# Patient Record
Sex: Female | Born: 1965
Health system: Southern US, Community
[De-identification: ages and names within clinical notes are randomized; demographics above are authoritative.]

## PROBLEM LIST (undated history)

## (undated) DIAGNOSIS — I10 Essential (primary) hypertension: Secondary | ICD-10-CM

## (undated) DIAGNOSIS — D869 Sarcoidosis, unspecified: Secondary | ICD-10-CM

## (undated) DIAGNOSIS — R569 Unspecified convulsions: Secondary | ICD-10-CM

## (undated) DIAGNOSIS — F32A Depression, unspecified: Secondary | ICD-10-CM

---

## 2007-02-06 ENCOUNTER — Ambulatory Visit: Payer: Self-pay | Admitting: Internal Medicine

## 2007-02-06 LAB — CONVERTED CEMR LAB
AST: 19 units/L (ref 0–37)
Alkaline Phosphatase: 47 units/L (ref 39–117)
BUN: 11 mg/dL (ref 6–23)
Basophils Relative: 1 % (ref 0–1)
Calcium: 9 mg/dL (ref 8.4–10.5)
Creatinine, Ser: 0.75 mg/dL (ref 0.40–1.20)
Eosinophils Absolute: 0.2 10*3/uL (ref 0.0–0.7)
Glucose, Bld: 97 mg/dL (ref 70–99)
HCT: 41.6 % (ref 36.0–46.0)
HDL: 61 mg/dL (ref >39)
Hemoglobin: 13.4 g/dL (ref 12.0–15.0)
LDL Cholesterol: 81 mg/dL (ref 0–99)
MCHC: 32.2 g/dL (ref 30.0–36.0)
MCV: 87.9 fL (ref 78.0–100.0)
Monocytes Absolute: 0.4 10*3/uL (ref 0.2–0.7)
Monocytes Relative: 14 % — ABNORMAL HIGH (ref 3–11)
RBC: 4.73 M/uL (ref 3.87–5.11)
Total CHOL/HDL Ratio: 2.7
Triglycerides: 100 mg/dL (ref <150)

## 2007-02-09 ENCOUNTER — Ambulatory Visit: Payer: Self-pay | Admitting: *Deleted

## 2007-03-02 ENCOUNTER — Encounter: Payer: Self-pay | Admitting: Family Medicine

## 2007-03-02 ENCOUNTER — Ambulatory Visit: Payer: Self-pay | Admitting: Internal Medicine

## 2007-03-20 ENCOUNTER — Ambulatory Visit (HOSPITAL_COMMUNITY): Admission: RE | Admit: 2007-03-20 | Discharge: 2007-03-20 | Payer: Self-pay | Admitting: Family Medicine

## 2009-02-10 ENCOUNTER — Encounter: Admission: RE | Admit: 2009-02-10 | Discharge: 2009-02-10 | Payer: Self-pay | Admitting: Internal Medicine

## 2009-02-10 IMAGING — US US SOFT TISSUE HEAD/NECK
1 series · 14 of 18 positions shown · non-contrast
Comparison: None

CLINICAL DATA: Enlarged thyroid on physical exam

THYROID ULTRASOUND
TECHNIQUE: Ultrasound examination of the thyroid gland and
adjacent soft tissues was performed.

[Series 1: us soft tissue head/neck · 0.07mm/px · 14 of 18 slices shown]
[im 1/18]
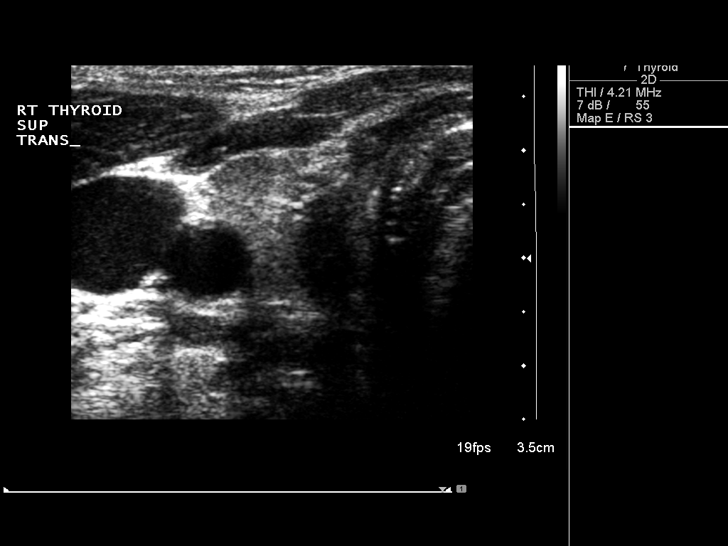
[im 2/18]
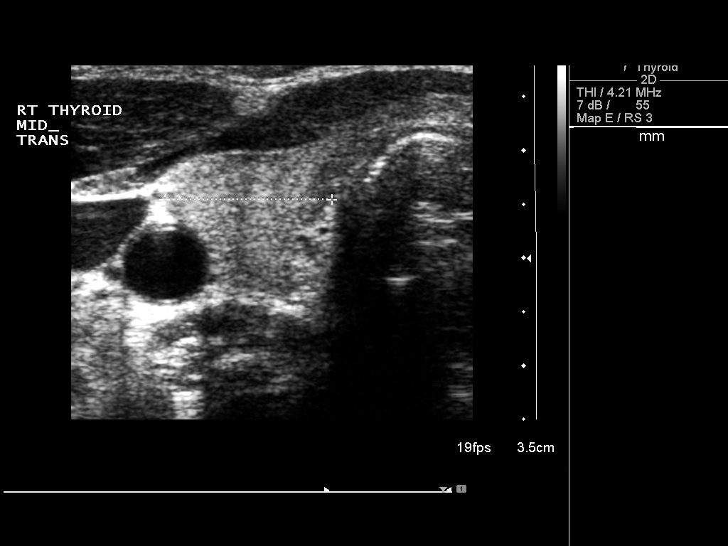
[im 4/18]
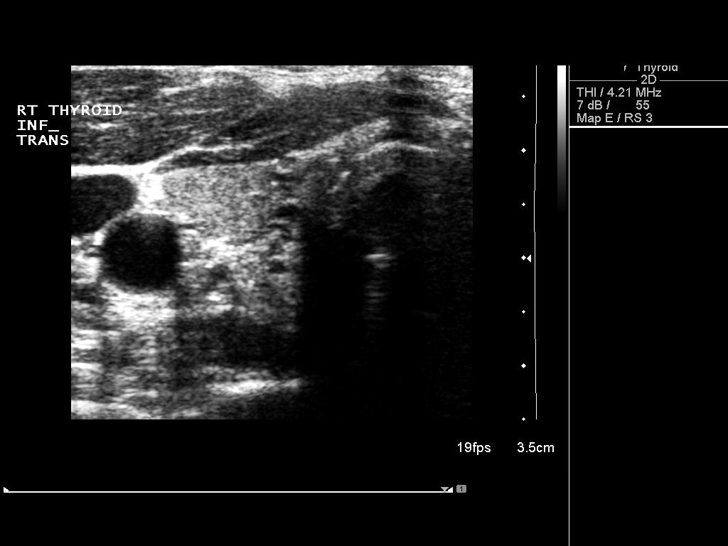
[im 5/18]
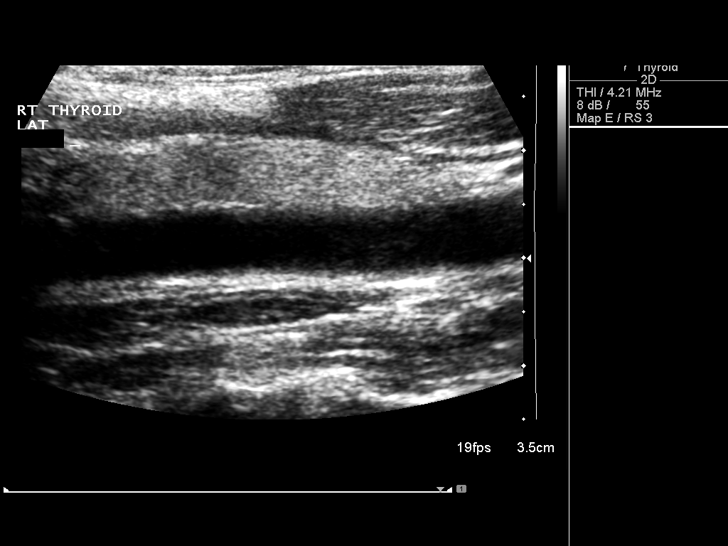
[im 6/18]
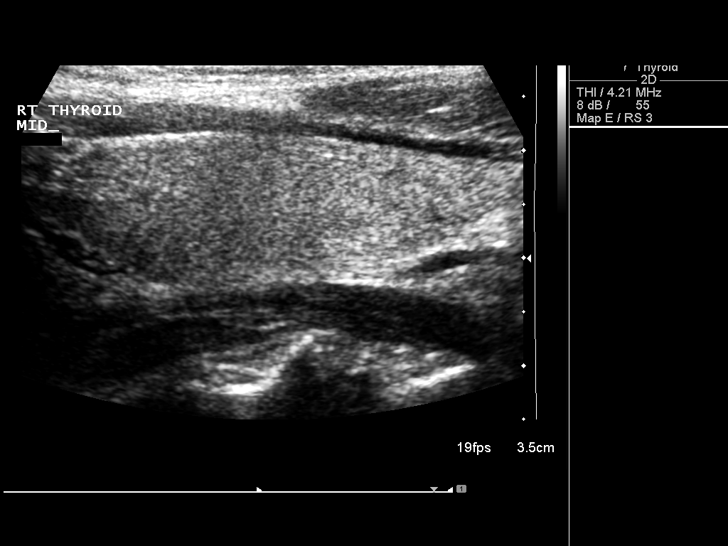
[im 8/18]
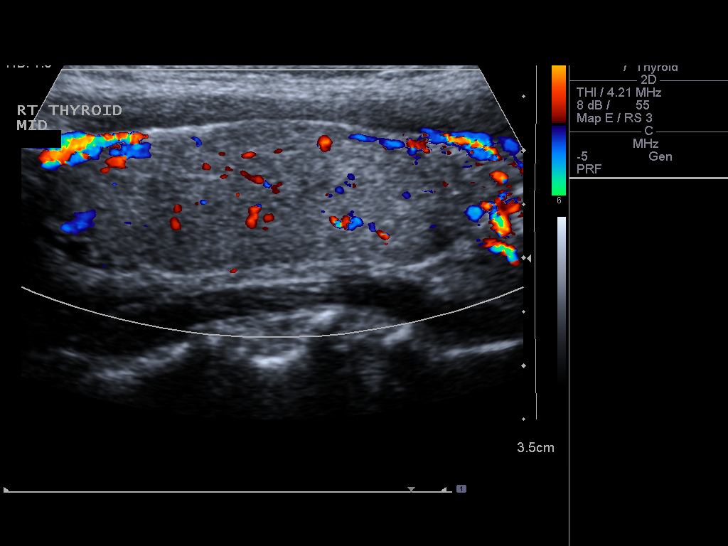
[im 9/18]
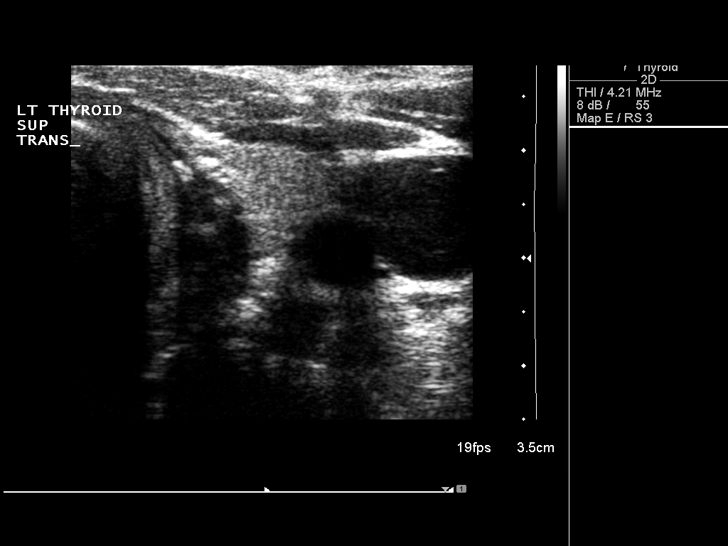
[im 10/18]
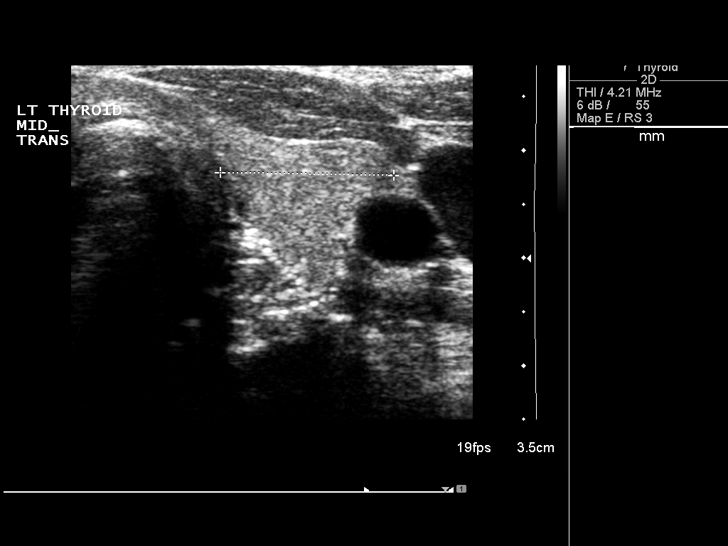
[im 11/18]
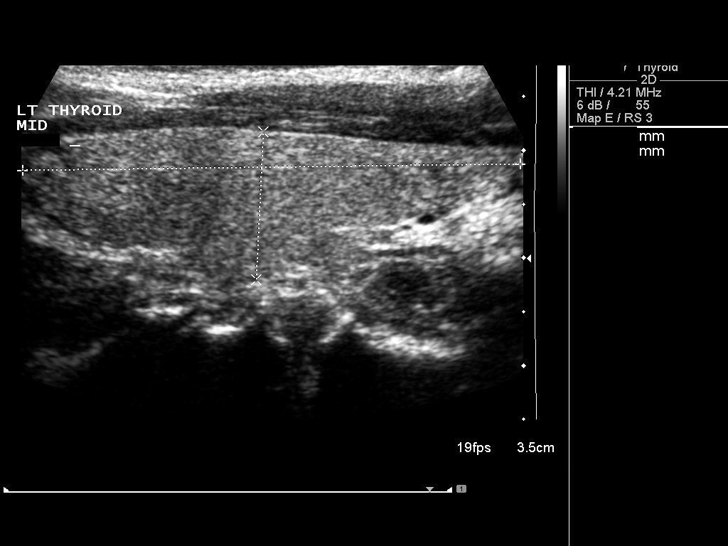
[im 13/18]
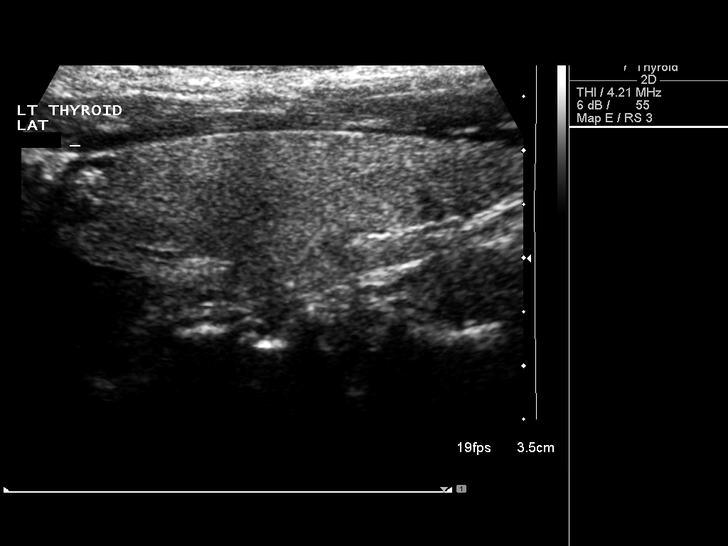
[im 14/18]
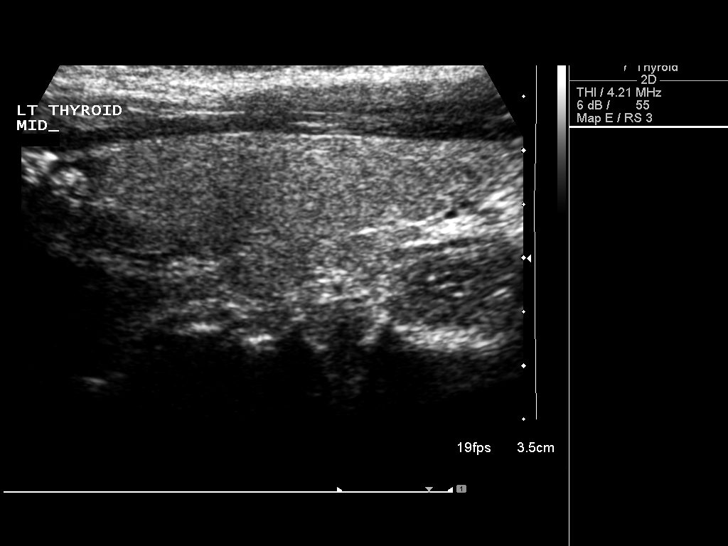
[im 15/18]
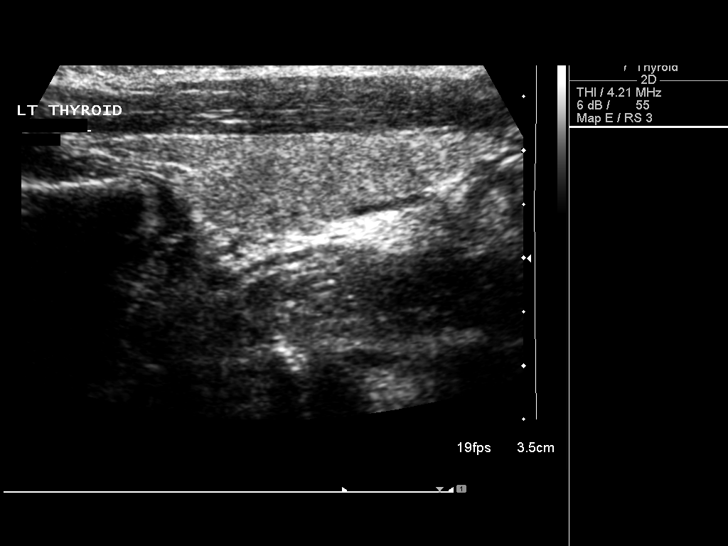
[im 17/18]
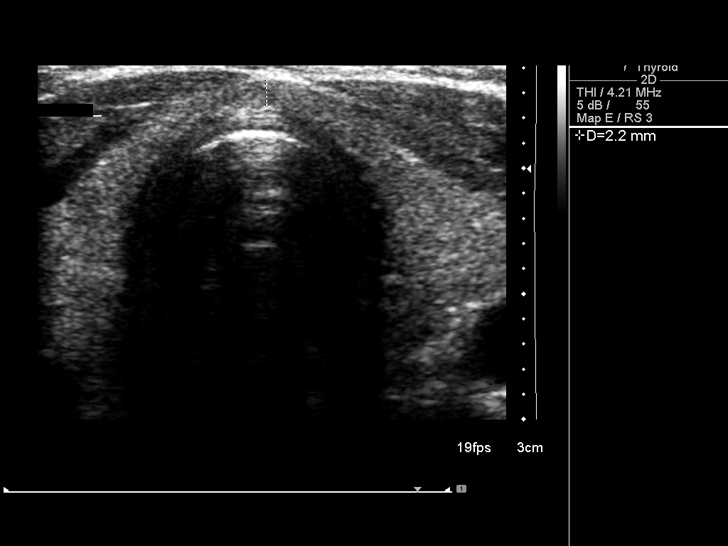
[im 18/18]
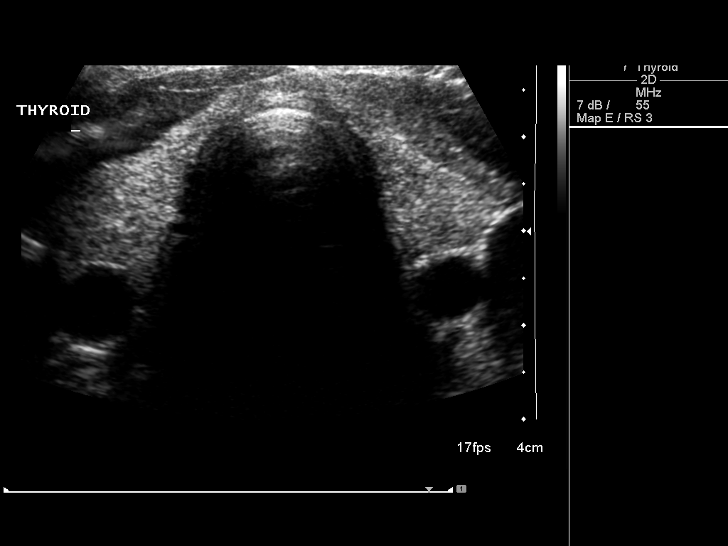

[14 of 18 positions shown; findings below may reference images not displayed]

FINDINGS: The thyroid gland is within normal limits in size.  The
right lobe measures 4.4 cm sagittally, with a depth of 1.5 cm and
width of 1.6 cm.  The left lobe measures 4.6 x 1.4 x 1.6 cm, with
the isthmus measuring 2 mm.  The gland is homogeneous in
echogenicity.  No solid or cystic nodule is seen.
IMPRESSION: The thyroid gland is normal in size with no solid or cystic nodule
noted.

## 2009-06-26 ENCOUNTER — Other Ambulatory Visit: Admission: RE | Admit: 2009-06-26 | Discharge: 2009-06-26 | Payer: Self-pay | Admitting: *Deleted

## 2009-07-05 ENCOUNTER — Ambulatory Visit (HOSPITAL_COMMUNITY): Admission: RE | Admit: 2009-07-05 | Discharge: 2009-07-05 | Payer: Self-pay | Admitting: Internal Medicine

## 2011-03-04 ENCOUNTER — Other Ambulatory Visit: Payer: Self-pay | Admitting: Family Medicine

## 2011-03-04 DIAGNOSIS — I10 Essential (primary) hypertension: Secondary | ICD-10-CM

## 2011-03-11 ENCOUNTER — Ambulatory Visit
Admission: RE | Admit: 2011-03-11 | Discharge: 2011-03-11 | Disposition: A | Discharge status: Home / Self Care | Payer: PRIVATE HEALTH INSURANCE | Source: Ambulatory Visit | Attending: Family Medicine | Admitting: Family Medicine

## 2011-03-11 DIAGNOSIS — I10 Essential (primary) hypertension: Secondary | ICD-10-CM

## 2011-03-11 IMAGING — US US ABDOMEN COMPLETE
1 series · 14 of 25 positions shown · non-contrast
Comparison: None.

CLINICAL DATA: Hypertension.  Question splenomegaly.

COMPLETE ABDOMINAL ULTRASOUND

[Series 1: us abdomen complete · 0.30mm/px · 14 of 93 slices shown]
[im 1/93]
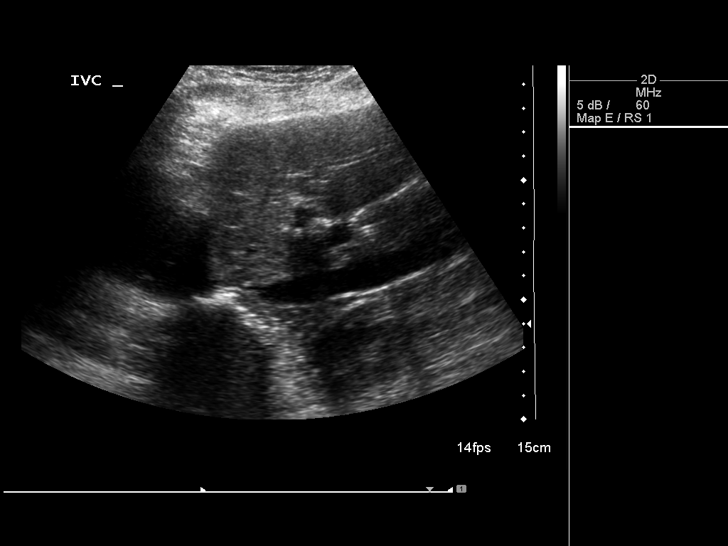
[im 8/93]
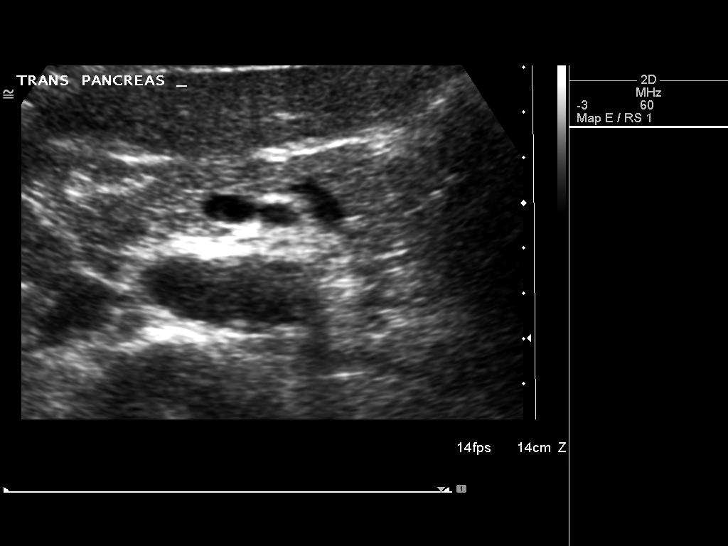
[im 16/93]
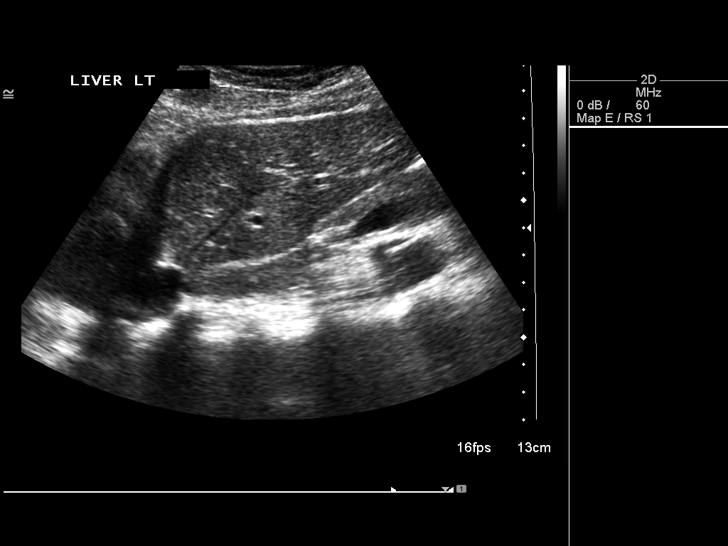
[im 24/93]
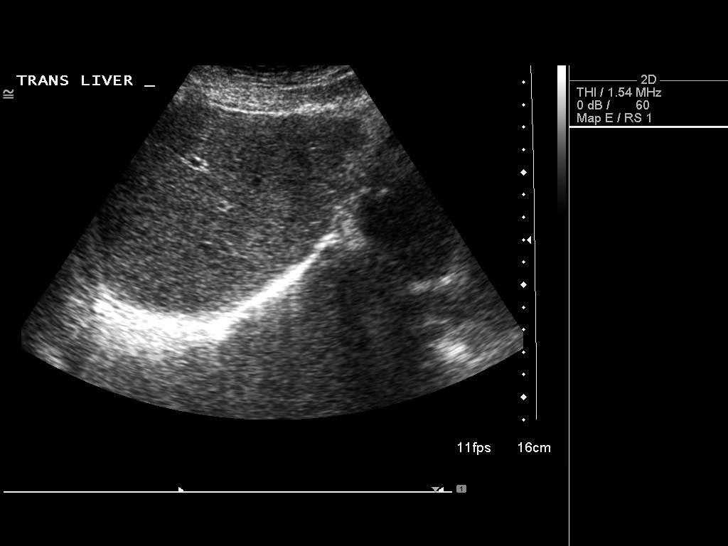
[im 31/93]
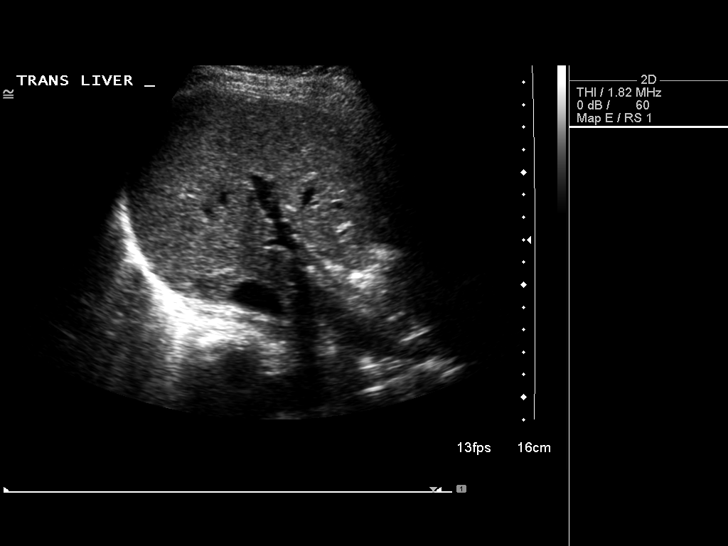
[im 35/93]
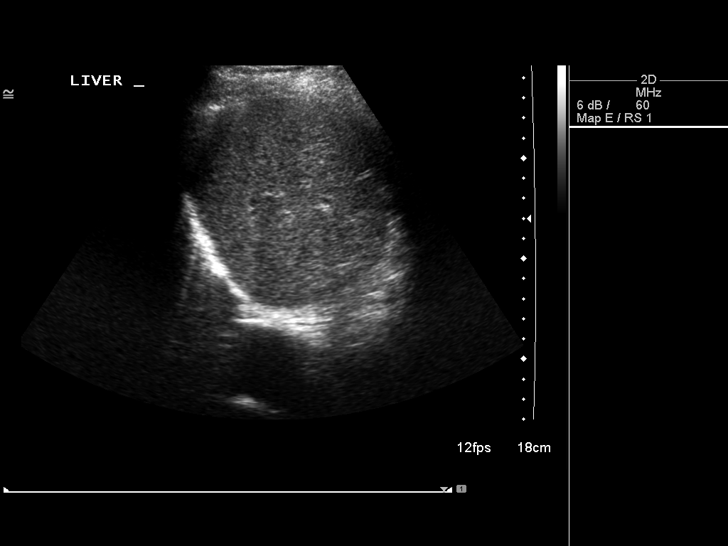
[im 43/93]
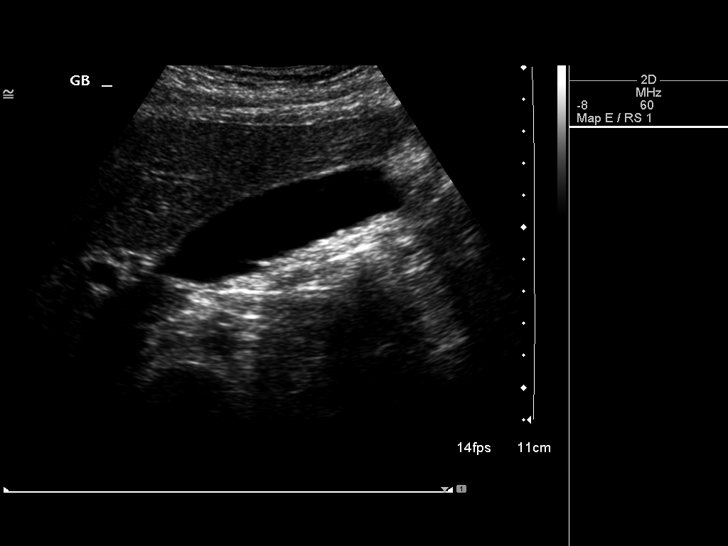
[im 50/93]
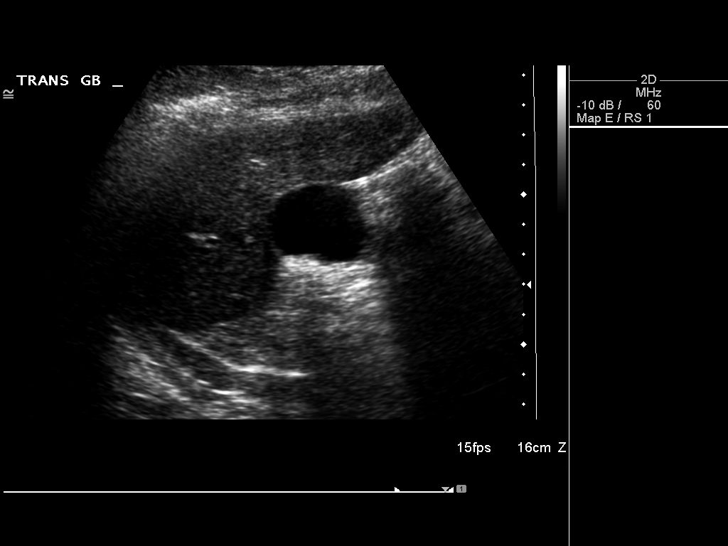
[im 58/93]
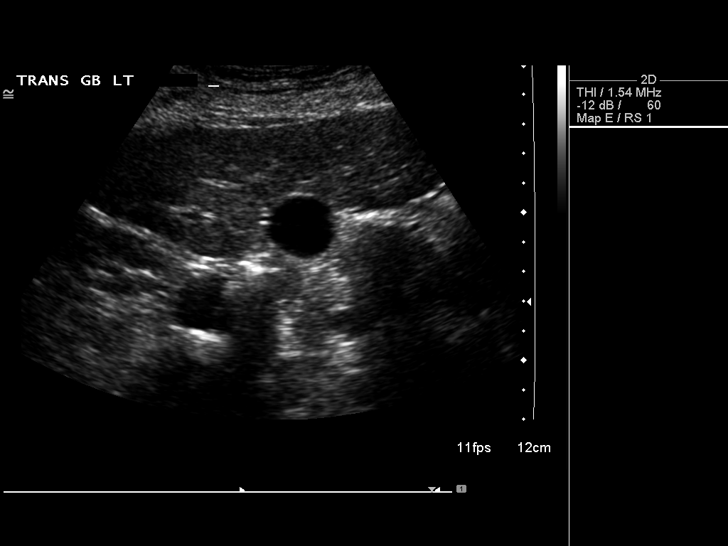
[im 62/93]
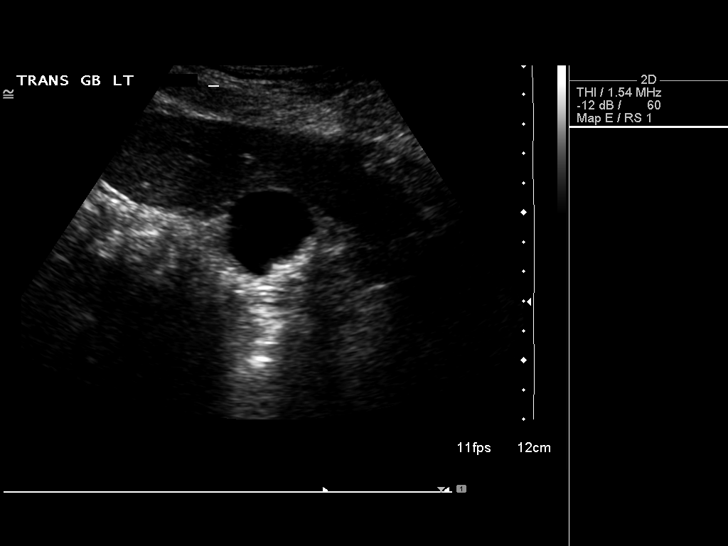
[im 70/93]
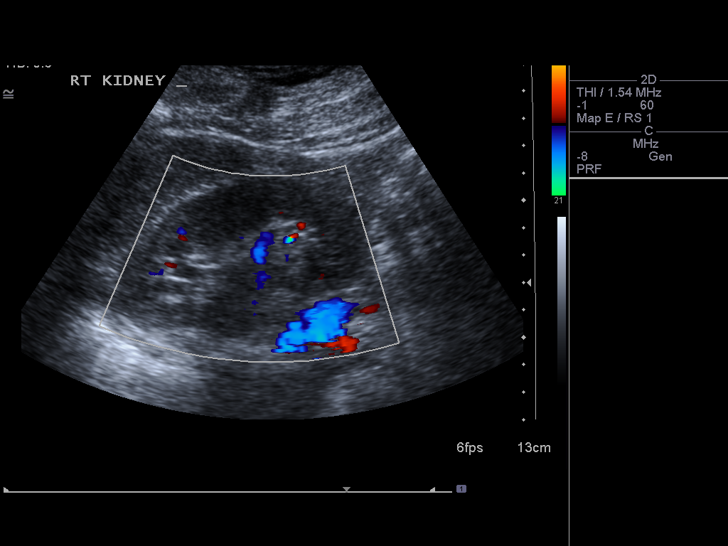
[im 77/93]
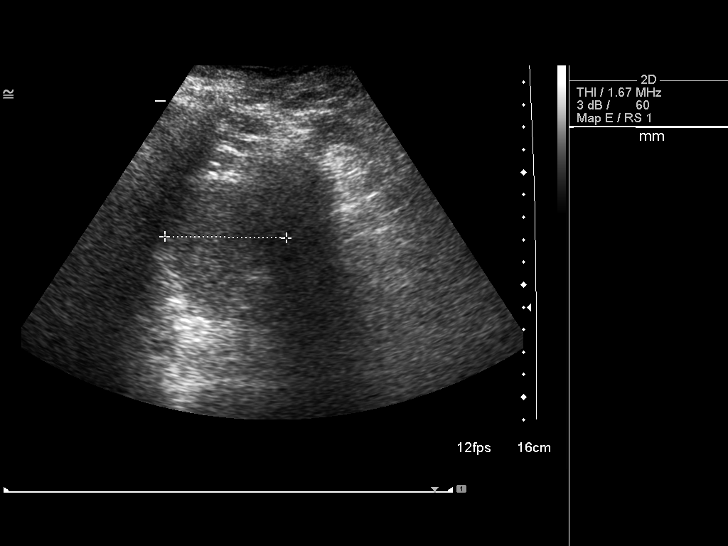
[im 85/93]
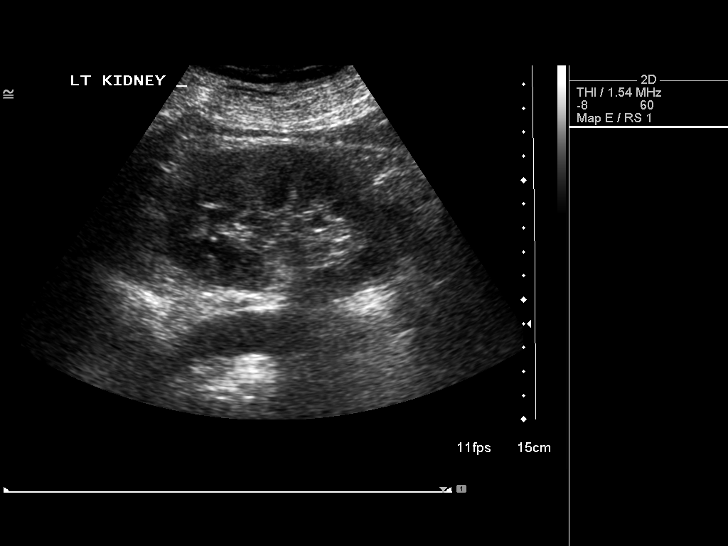
[im 93/93]
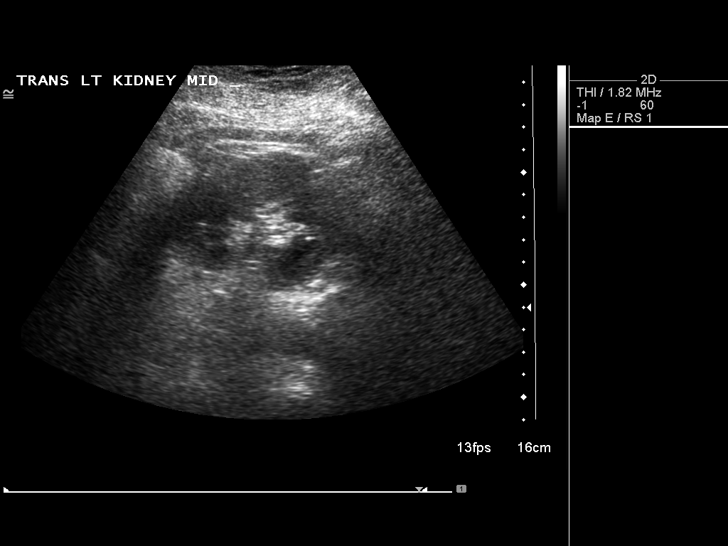

[14 of 25 positions shown; findings below may reference images not displayed]

FINDINGS: Gallbladder:  Numerous echogenic foci within the gallbladder.
Minimal if any shadowing.  No wall thickening or pericholecystic
fluid. Sonographic Murphy's sign was not elicited.

Common bile duct: Normal, 2 mm.

Liver: Normal in echogenicity, without focal lesion.

IVC: Negative

Pancreas:  Negative

Spleen:  Within normal limits.  Splenic length 5.4 cm.

Right Kidney:  10.2 cm. No hydronephrosis.

Left Kidney:  10.0 cm. No hydronephrosis.

Abdominal aorta:  Nonaneurysmal without ascites.
IMPRESSION: 1.  No evidence of splenomegaly or acute abdominal process.
2.  Nonshadowing gallstones versus sludge.  No evidence of acute
cholecystitis.

## 2013-08-02 ENCOUNTER — Emergency Department (HOSPITAL_COMMUNITY): Payer: PRIVATE HEALTH INSURANCE

## 2013-08-02 ENCOUNTER — Encounter (HOSPITAL_COMMUNITY): Payer: Self-pay | Admitting: Emergency Medicine

## 2013-08-02 DIAGNOSIS — Z7982 Long term (current) use of aspirin: Secondary | ICD-10-CM | POA: Insufficient documentation

## 2013-08-02 DIAGNOSIS — I10 Essential (primary) hypertension: Secondary | ICD-10-CM | POA: Insufficient documentation

## 2013-08-02 DIAGNOSIS — J040 Acute laryngitis: Secondary | ICD-10-CM | POA: Insufficient documentation

## 2013-08-02 DIAGNOSIS — J069 Acute upper respiratory infection, unspecified: Secondary | ICD-10-CM | POA: Insufficient documentation

## 2013-08-02 DIAGNOSIS — Z87891 Personal history of nicotine dependence: Secondary | ICD-10-CM | POA: Insufficient documentation

## 2013-08-02 DIAGNOSIS — Z8619 Personal history of other infectious and parasitic diseases: Secondary | ICD-10-CM | POA: Insufficient documentation

## 2013-08-02 LAB — CBC
HEMATOCRIT: 39.2 % (ref 36.0–46.0)
HEMOGLOBIN: 13.4 g/dL (ref 12.0–15.0)
MCH: 29.1 pg (ref 26.0–34.0)
MCHC: 34.2 g/dL (ref 30.0–36.0)
MCV: 85 fL (ref 78.0–100.0)
Platelets: 126 10*3/uL — ABNORMAL LOW (ref 150–400)
RBC: 4.61 MIL/uL (ref 3.87–5.11)
RDW: 13.8 % (ref 11.5–15.5)
WBC: 2 10*3/uL — AB (ref 4.0–10.5)

## 2013-08-02 LAB — BASIC METABOLIC PANEL
BUN: 7 mg/dL (ref 6–23)
CHLORIDE: 96 meq/L (ref 96–112)
CO2: 27 meq/L (ref 19–32)
Calcium: 9 mg/dL (ref 8.4–10.5)
Creatinine, Ser: 0.63 mg/dL (ref 0.50–1.10)
GFR calc non Af Amer: >90 mL/min (ref >90)
Glucose, Bld: 99 mg/dL (ref 70–99)
POTASSIUM: 4.7 meq/L (ref 3.7–5.3)
Sodium: 137 mEq/L (ref 137–147)

## 2013-08-02 IMAGING — CR DG CHEST 2V
2 series · 2 of 2 positions shown · non-contrast
Comparison: None.

CLINICAL DATA: Sore throat.

EXAM:
CHEST  2 VIEW

[w chest pa]
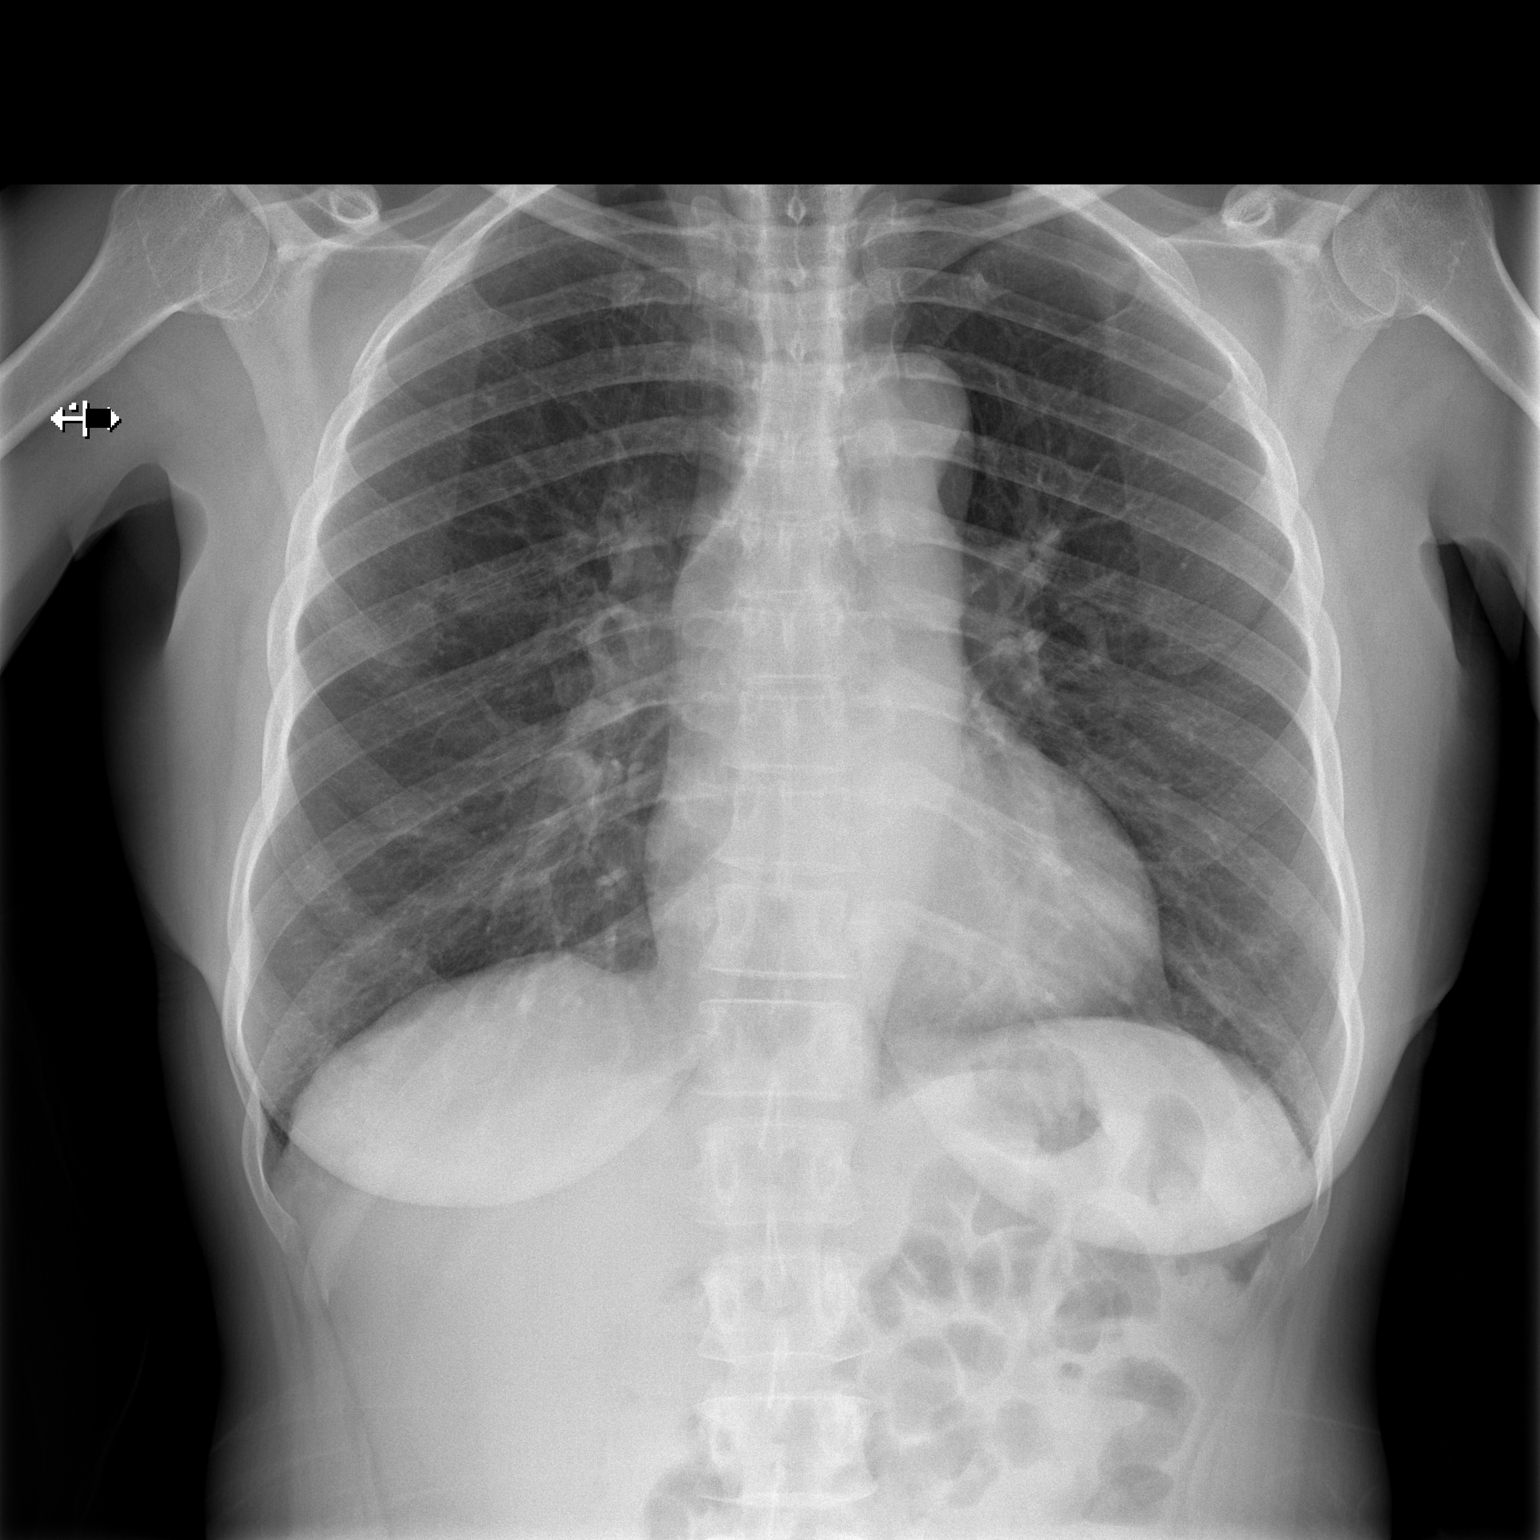

[w chest lat]
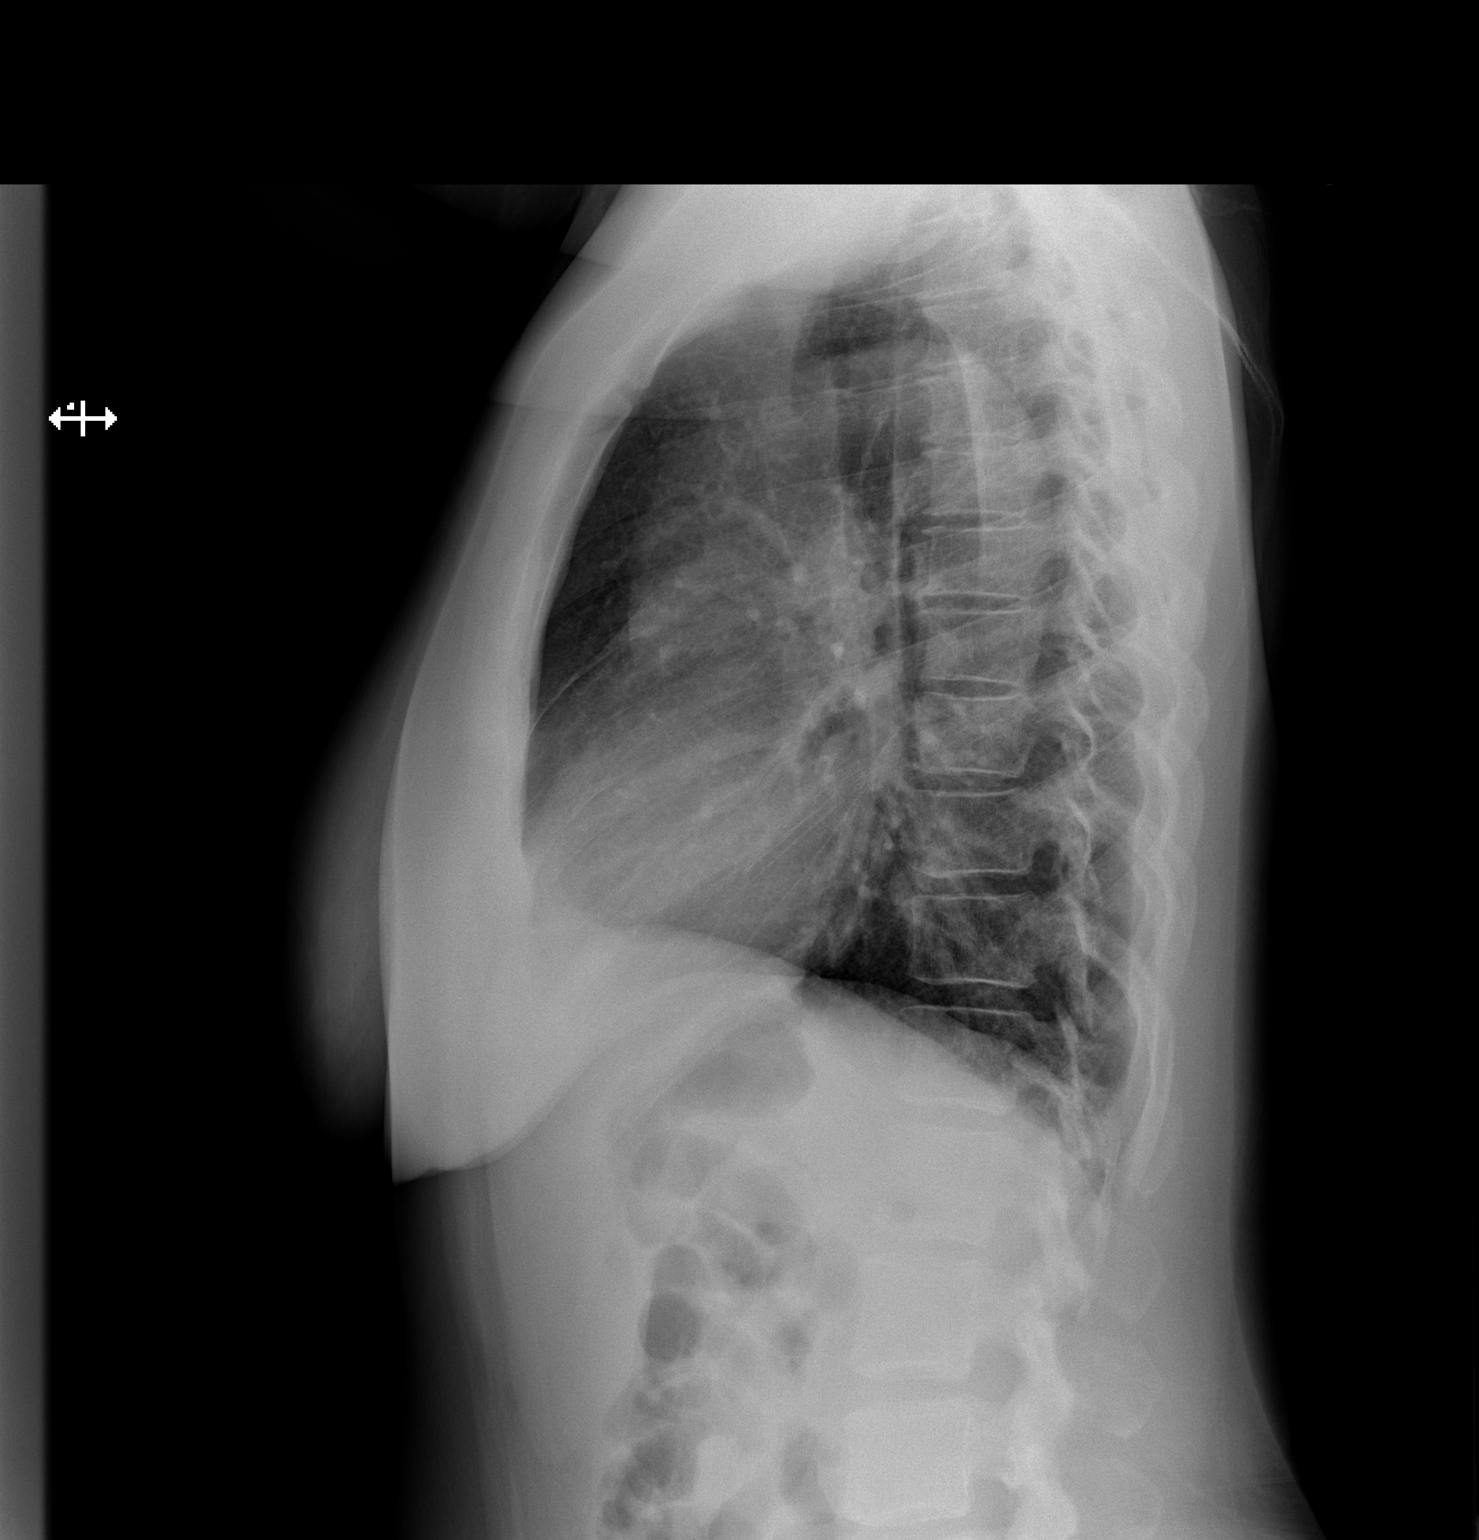

[2 of 2 positions shown; findings below may reference images not displayed]

FINDINGS: Cardiopericardial silhouette within normal limits. Mediastinal
contours normal. Trachea midline. No airspace disease or effusion.
IMPRESSION: No active cardiopulmonary disease.

## 2013-08-02 MED ORDER — ALBUTEROL SULFATE (2.5 MG/3ML) 0.083% IN NEBU
5.0000 mg | INHALATION_SOLUTION | Freq: Once | RESPIRATORY_TRACT | Status: AC
Start: 2013-08-02 — End: 2013-08-02
  Administered 2013-08-02: 5 mg via RESPIRATORY_TRACT
  Filled 2013-08-02: qty 6

## 2013-08-02 NOTE — ED Notes (Signed)
Pt reports that on Thursday she woke up with itchy/sore throat and loss of voice. Pt also reports difficulty breathing specifically when she lays down. Pt with inspiratory and expiratory wheezes. Pt also has productive cough.

## 2013-08-03 ENCOUNTER — Emergency Department (HOSPITAL_COMMUNITY)
Admission: EM | Admit: 2013-08-03 | Discharge: 2013-08-03 | Disposition: A | Discharge status: Home / Self Care | Payer: Self-pay | Attending: Emergency Medicine | Admitting: Emergency Medicine

## 2013-08-03 DIAGNOSIS — J040 Acute laryngitis: Secondary | ICD-10-CM

## 2013-08-03 DIAGNOSIS — J069 Acute upper respiratory infection, unspecified: Secondary | ICD-10-CM

## 2013-08-03 HISTORY — DX: Sarcoidosis, unspecified: D86.9

## 2013-08-03 HISTORY — DX: Essential (primary) hypertension: I10

## 2013-08-03 MED ORDER — DEXAMETHASONE SODIUM PHOSPHATE 10 MG/ML IJ SOLN
10.0000 mg | Freq: Once | INTRAMUSCULAR | Status: AC
  Administered 2013-08-03: 10 mg via INTRAMUSCULAR
  Filled 2013-08-03: qty 1

## 2013-08-03 MED ORDER — PREDNISONE 20 MG PO TABS: ORAL_TABLET | ORAL | Status: DC

## 2013-08-03 MED ORDER — ALBUTEROL SULFATE HFA 108 (90 BASE) MCG/ACT IN AERS
2.0000 | INHALATION_SPRAY | Freq: Once | RESPIRATORY_TRACT | Status: AC
  Administered 2013-08-03: 2 via RESPIRATORY_TRACT
  Filled 2013-08-03: qty 6.7

## 2013-08-03 NOTE — Discharge Instructions (Signed)
Cool Mist Vaporizers °Vaporizers may help relieve the symptoms of a cough and cold. They add moisture to the air, which helps mucus to become thinner and less sticky. This makes it easier to breathe and cough up secretions. Cool mist vaporizers do not cause serious burns like hot mist vaporizers ("steamers, humidifiers"). Vaporizers have not been proved to show they help with colds. You should not use a vaporizer if you are allergic to mold.  °HOME CARE INSTRUCTIONS °· Follow the package instructions for the vaporizer. °· Do not use anything other than distilled water in the vaporizer. °· Do not run the vaporizer all of the time. This can cause mold or bacteria to grow in the vaporizer. °· Clean the vaporizer after each time it is used. °· Clean and dry the vaporizer well before storing it. °· Stop using the vaporizer if worsening respiratory symptoms develop. °Document Released: 01/11/2004 Document Revised: 12/16/2012 Document Reviewed: 09/02/2012 °Exit[employer]® Patient Information ©2014 [employer]. ° °

## 2013-08-03 NOTE — ED Notes (Signed)
Pt called for room no answer x1 

## 2013-08-03 NOTE — ED Provider Notes (Signed)
CSN: [id number]     Arrival date & time 08/02/13  1807 History   First MD Initiated Contact with Patient 08/03/13 0125     Chief Complaint  Patient presents with  . Sore Throat     (Consider location/radiation/quality/duration/timing/severity/associated sxs/prior Treatment) HPI This patient is a 48 yo woman who has had about 4 days of a hoarse voice, productive cough and sensation of difficulty breathing when laying supine. She has a history of sarcoidosis and HTN. She has a sore throat as well. Discomfort is mild to moderate, worse with talking. No fever.   The patient has not had similar sx in the past. She is a non-smoker. No unusual exposures. She was treated with Albuterol 5mg  SVN while waiting to be seen and says that this helped.    Past Medical History  Diagnosis Date  . Hypertension   . Sarcoidosis    History reviewed. No pertinent past surgical history. No family history on file. History  Substance Use Topics  . Smoking status: Former Smoker    Quit date: 07/26/2013  . Smokeless tobacco: Not on file  . Alcohol Use: Yes     Comment: occasionally   OB History   Grav Para Term Preterm Abortions TAB SAB Ect Mult Living                 Review of Systems Ten point review of symptoms performed and is negative with the exception of symptoms noted above.     Allergies  Review of patient's allergies indicates no known allergies.  Home Medications   Current Outpatient Rx  Name  Route  Sig  Dispense  Refill  . aspirin 325 MG tablet   Oral   Take 325 mg by mouth daily.         Marland Kitchen. dextromethorphan-guaiFENesin (MUCINEX DM) 30-600 MG per 12 hr tablet   Oral   Take 1 tablet by mouth 2 (two) times daily as needed for cough.         . Multiple Vitamins-Minerals (MULTIVITAMIN WITH MINERALS) tablet   Oral   Take 1 tablet by mouth daily.          BP 153/99  Pulse 82  Temp(Src) 98.2 F (36.8 C) (Oral)  Resp 18  SpO2 98% Physical Exam Gen: well developed and  well nourished appearing, hoarse sounding voice Head: NCAT Eyes: PERL, EOMI Nose: no epistaixis or rhinorrhea Mouth/throat: mucosa is moist and pink Neck: supple, no stridor Lungs: CTA B, no wheezing, rhonchi or rales CV: regular rate and rythm, good distal pulses.  Abd: soft, notender, nondistended Back: no ttp, no cva ttp Skin: warm and dry Ext: no edema, normal to inspection Neuro: CN ii-xii grossly intact, no focal deficits Psyche; normal affect,  calm and cooperative. ED Course  Procedures (including critical care time) Labs Review  Results for orders placed during the hospital encounter of 08/03/13 (from the past 24 hour(s))  BASIC METABOLIC PANEL     Status: None   Collection Time    08/02/13  9:16 PM      Result Value Ref Range   Sodium 137  137 - 147 mEq/L   Potassium 4.7  3.7 - 5.3 mEq/L   Chloride 96  96 - 112 mEq/L   CO2 27  19 - 32 mEq/L   Glucose, Bld 99  70 - 99 mg/dL   BUN 7  6 - 23 mg/dL   Creatinine, Ser 4.090.63  0.50 - 1.10 mg/dL   Calcium 9.0  8.4 - 10.5 mg/dL   GFR calc non Af Amer >90  >90 mL/min   GFR calc Af Amer >90  >90 mL/min  CBC     Status: Abnormal   Collection Time    08/02/13  9:16 PM      Result Value Ref Range   WBC 2.0 (*) 4.0 - 10.5 K/uL   RBC 4.61  3.87 - 5.11 MIL/uL   Hemoglobin 13.4  12.0 - 15.0 g/dL   HCT 57.8  46.9 - 62.9 %   MCV 85.0  78.0 - 100.0 fL   MCH 29.1  26.0 - 34.0 pg   MCHC 34.2  30.0 - 36.0 g/dL   RDW 52.8  41.3 - 24.4 %   Platelets 126 (*) 150 - 400 K/uL     Imaging Review Dg Chest 2 View (if Patient Has Fever And/or Copd)  08/02/2013   CLINICAL DATA:  Sore throat.  EXAM: CHEST  2 VIEW  COMPARISON:  None.  FINDINGS: Cardiopericardial silhouette within normal limits. Mediastinal contours normal. Trachea midline. No airspace disease or effusion.  IMPRESSION: No active cardiopulmonary disease.   Electronically Signed   By: Andreas Newport M.D.   On: 08/02/2013 19:37      MDM  Patient's symptoms are most consistent  with viral URI with bronchitis and laryngitis. The patient is managing her secretions, eating fine and is able to speak. We will tx with steroid burst and Albuterol as needed via HFA. Patient is counseled to return to the ED for worsening symptoms and to schedule f/u with the [facility] in 2 days. Referred to ENT should her sx not resolve within 7 to 10 days.     Brandt Loosen, MD 08/03/13 [phone]

## 2013-08-03 NOTE — ED Notes (Signed)
Pt called for room with no answer x 2. 

## 2013-08-03 NOTE — ED Notes (Signed)
Pt called for room with no answer x 3 

## 2013-12-17 ENCOUNTER — Ambulatory Visit: Payer: Commercial Indemnity | Attending: Internal Medicine | Admitting: Internal Medicine

## 2013-12-17 VITALS — BP 191/116 | HR 55 | Temp 99.0°F | Resp 16 | Ht 62.0 in | Wt 148.4 lb

## 2013-12-17 DIAGNOSIS — R630 Anorexia: Secondary | ICD-10-CM | POA: Insufficient documentation

## 2013-12-17 DIAGNOSIS — R05 Cough: Secondary | ICD-10-CM | POA: Insufficient documentation

## 2013-12-17 DIAGNOSIS — Z7189 Other specified counseling: Secondary | ICD-10-CM

## 2013-12-17 DIAGNOSIS — H5789 Other specified disorders of eye and adnexa: Secondary | ICD-10-CM | POA: Insufficient documentation

## 2013-12-17 DIAGNOSIS — I10 Essential (primary) hypertension: Secondary | ICD-10-CM | POA: Diagnosis not present

## 2013-12-17 DIAGNOSIS — F17200 Nicotine dependence, unspecified, uncomplicated: Secondary | ICD-10-CM | POA: Insufficient documentation

## 2013-12-17 DIAGNOSIS — R059 Cough, unspecified: Secondary | ICD-10-CM | POA: Insufficient documentation

## 2013-12-17 DIAGNOSIS — R498 Other voice and resonance disorders: Secondary | ICD-10-CM | POA: Insufficient documentation

## 2013-12-17 DIAGNOSIS — Z7689 Persons encountering health services in other specified circumstances: Secondary | ICD-10-CM

## 2013-12-17 DIAGNOSIS — D869 Sarcoidosis, unspecified: Secondary | ICD-10-CM | POA: Diagnosis not present

## 2013-12-17 DIAGNOSIS — H5710 Ocular pain, unspecified eye: Secondary | ICD-10-CM | POA: Insufficient documentation

## 2013-12-17 DIAGNOSIS — Z7982 Long term (current) use of aspirin: Secondary | ICD-10-CM | POA: Insufficient documentation

## 2013-12-17 LAB — POCT URINALYSIS DIPSTICK
Bilirubin, UA: NEGATIVE
Blood, UA: NEGATIVE
GLUCOSE UA: NEGATIVE
KETONES UA: NEGATIVE
Leukocytes, UA: NEGATIVE
Nitrite, UA: NEGATIVE
Protein, UA: NEGATIVE
Urobilinogen, UA: 0.2
pH, UA: 6

## 2013-12-17 LAB — CBC
HEMATOCRIT: 37 % (ref 36.0–46.0)
Hemoglobin: 12.5 g/dL (ref 12.0–15.0)
MCH: 28.3 pg (ref 26.0–34.0)
MCHC: 33.8 g/dL (ref 30.0–36.0)
MCV: 83.7 fL (ref 78.0–100.0)
Platelets: 216 10*3/uL (ref 150–400)
RBC: 4.42 MIL/uL (ref 3.87–5.11)
RDW: 13.9 % (ref 11.5–15.5)
WBC: 2.1 10*3/uL — AB (ref 4.0–10.5)

## 2013-12-17 MED ORDER — CLONIDINE HCL 0.1 MG PO TABS
0.2000 mg | ORAL_TABLET | Freq: Once | ORAL | Status: AC
  Administered 2013-12-17: 0.2 mg via ORAL

## 2013-12-17 MED ORDER — HYDROCHLOROTHIAZIDE 12.5 MG PO TABS: 12.5000 mg | ORAL_TABLET | Freq: Every day | ORAL | Status: DC

## 2013-12-17 MED ORDER — PREDNISONE 20 MG PO TABS: 20.0000 mg | ORAL_TABLET | Freq: Every day | ORAL | Status: DC

## 2013-12-17 MED ORDER — METHYLPREDNISOLONE SODIUM SUCC 125 MG IJ SOLR
125.0000 mg | Freq: Once | INTRAMUSCULAR | Status: AC
  Administered 2013-12-17: 125 mg via INTRAMUSCULAR

## 2013-12-17 MED ORDER — HYDROCHLOROTHIAZIDE 25 MG PO TABS
25.0000 mg | ORAL_TABLET | Freq: Every day | ORAL | Status: DC
Start: 2013-12-17 — End: 2020-01-12

## 2013-12-17 MED ORDER — AMLODIPINE BESYLATE 10 MG PO TABS: 10.0000 mg | ORAL_TABLET | Freq: Every day | ORAL | Status: DC

## 2013-12-17 NOTE — Progress Notes (Signed)
Patient ID: Martha Schneider, female   DOB: 02/15/1966, 48 y.o.   MRN: [id number]  [id number]  [id number]  DOB - 10/03/1965  CC:  Chief Complaint  Patient presents with  . Establish Care       HPI: Martha MendsJanine Ottey is a 48 y.o. female here today to establish medical care.  Patient presents today with a history of hypertension and sarcoidosis.  She reports that she has been under a ton of stress dealing with lossing her job and taking care of her sister.  She has had sarcoidosis for over 20 years and has used steroid treatment.  Last flare was a few years ago.  She states that she only has flares every couple of years.  Eye pain, redness, and voice change all began 2 months ago.  She reports that she was on BP medication years ago but reports her BP improved and she discontinued the medications.  She reports that she is a daily smoke of 6 cigarettes per day.  Is up to date on Tdap and pap smear.    Eye doctor said he found some eye inflammation that was related to her sarcoidosis  Has eye pain, voice change  Coughing, SOB,   Patient has No headache, No chest pain, No abdominal pain - No Nausea, No new weakness tingling or numbness, No Cough - SOB.  No Known Allergies Past Medical History  Diagnosis Date  . Hypertension   . Sarcoidosis    Current Outpatient Prescriptions on File Prior to Visit  Medication Sig Dispense Refill  . aspirin 325 MG tablet Take 325 mg by mouth daily.      . Multiple Vitamins-Minerals (MULTIVITAMIN WITH MINERALS) tablet Take 1 tablet by mouth daily.      Marland Kitchen. dextromethorphan-guaiFENesin (MUCINEX DM) 30-600 MG per 12 hr tablet Take 1 tablet by mouth 2 (two) times daily as needed for cough.      . predniSONE (DELTASONE) 20 MG tablet 3 tabs po day one, then 2 tabs daily x 4 days  11 tablet  0   No current facility-administered medications on file prior to visit.   No family history on file. History   Social History  . Marital Status: Single    Spouse Name: N/A      Number of Children: N/A  . Years of Education: N/A   Occupational History  . Not on file.   Social History Main Topics  . Smoking status: Former Smoker    Quit date: 07/26/2013  . Smokeless tobacco: Not on file  . Alcohol Use: Yes     Comment: occasionally  . Drug Use: No  . Sexual Activity: Not on file   Other Topics Concern  . Not on file   Social History Narrative  . No narrative on file   Review of Systems  Constitutional: Positive for weight loss (anorexia due to stress). Negative for fever and chills.  HENT: Negative.   Eyes: Positive for pain and redness.  Respiratory: Positive for cough and shortness of breath (with anxiety).   Cardiovascular: Negative.   Gastrointestinal: Negative.   Musculoskeletal: Negative.   Skin: Negative.   Neurological: Negative.   Endo/Heme/Allergies: Negative.   Psychiatric/Behavioral: Negative for depression, memory loss and substance abuse. The patient is nervous/anxious. The patient does not have insomnia.        Objective:   Filed Vitals:   12/17/13 1549  BP: 173/107  Pulse: 74  Temp: 99 F (37.2 C)  Resp: 16    Physical  Exam: Constitutional: Patient appears well-developed and well-nourished. No distress. HENT: Normocephalic, atraumatic, External right and left ear normal. Oropharynx is clear and moist.  Eyes: Conjunctivae and EOM are normal. PERRLA, no scleral icterus. Neck: Normal ROM. Neck supple. No JVD. No tracheal deviation. No thyromegaly. CVS: RRR, S1/S2 +, no murmurs, no gallops, no carotid bruit.  Pulmonary: Effort and breath sounds normal, no stridor, rhonchi, wheezes, rales.  Abdominal: Soft. BS +, no distension, tenderness, rebound or guarding.  Musculoskeletal: Normal range of motion. No edema and no tenderness.  Lymphadenopathy: No lymphadenopathy noted, cervical Neuro: Alert. Normal reflexes, muscle tone coordination. No cranial nerve deficit. Skin: Skin is warm and dry. No rash noted. Not  diaphoretic. No erythema. No pallor. Psychiatric: Normal mood and affect. Behavior, judgment, thought content normal.  Lab Results  Component Value Date   WBC 2.0* 08/02/2013   HGB 13.4 08/02/2013   HCT 39.2 08/02/2013   MCV 85.0 08/02/2013   PLT 126* 08/02/2013   Lab Results  Component Value Date   CREATININE 0.63 08/02/2013   BUN 7 08/02/2013   NA 137 08/02/2013   K 4.7 08/02/2013   CL 96 08/02/2013   CO2 27 08/02/2013    No results found for this basename: HGBA1C   Lipid Panel     Component Value Date/Time   CHOL 162 02/06/2007 2136   TRIG 100 02/06/2007 2136   HDL 61 02/06/2007 2136   CHOLHDL 2.7 Ratio 02/06/2007 2136   VLDL 20 02/06/2007 2136   LDLCALC 81 02/06/2007 2136       Assessment and plan:   Eyva was seen today for establish care.  Diagnoses and associated orders for this visit:  Essential hypertension - amLODipine (NORVASC) 10 MG tablet; Take 1 tablet (10 mg total) by mouth daily. - hydrochlorothiazide (HYDRODIURIL) 25 MG tablet; Take 1 tablet (25 mg total) by mouth daily.  Sarcoidosis - predniSONE (DELTASONE) 20 MG tablet; Take 1 tablet (20 mg total) by mouth daily with breakfast. 3 tabs daily for 3 days, 2 tabs for 2 days, 1 tablet for 2 days - methylPREDNISolone sodium succinate (SOLU-MEDROL) 125 mg/2 mL injection 125 mg; Inject 2 mLs (125 mg total) into the muscle once.  Establishing care with new doctor, encounter for - Urinalysis Dipstick - cloNIDine (CATAPRES) tablet 0.2 mg; Take 2 tablets (0.2 mg total) by mouth once. - CBC - COMPLETE METABOLIC PANEL WITH GFR - TSH - Hemoglobin A1c   Patient will RTC in one week for BP check with Nurse and 3 months with PCP   The patient was given clear instructions to go to ER or return to medical center if symptoms don't improve, worsen or new problems develop. The patient verbalized understanding.    Holland Commons, NP-C [facility] [phone] 12/17/2013, 4:29 PM

## 2013-12-17 NOTE — Progress Notes (Signed)
Patient has a hx of Sarcodosis and had surgery about 25 years ago.  Patient thinks it has flared back up. She states she is having a voice change, eye started turning red.  States she went to opthalmologist and was informed that her eye was inflammed.  Went to ED 07/2013 and was diagnosed with Laryngitis and given steroids, breathing treatments, and inhaler.   Last pap and mammogram were years ago and they were normal. Patient declines to do PAP today.  Patient states she was on a low dose of BP medication a couple years ago.  Patient states BP is probably up d/t her getting lost on the way here.  0.2 mg Clonidine administered.

## 2013-12-18 LAB — COMPLETE METABOLIC PANEL WITH GFR
ALT: 14 U/L (ref 0–35)
AST: 21 U/L (ref 0–37)
Albumin: 4.2 g/dL (ref 3.5–5.2)
Alkaline Phosphatase: 54 U/L (ref 39–117)
BUN: 12 mg/dL (ref 6–23)
CALCIUM: 8.9 mg/dL (ref 8.4–10.5)
CHLORIDE: 102 meq/L (ref 96–112)
CO2: 30 meq/L (ref 19–32)
CREATININE: 0.76 mg/dL (ref 0.50–1.10)
GFR, Est Non African American: >89 mL/min
GLUCOSE: 95 mg/dL (ref 70–99)
Potassium: 4.1 mEq/L (ref 3.5–5.3)
Sodium: 138 mEq/L (ref 135–145)
Total Bilirubin: 0.5 mg/dL (ref 0.2–1.2)
Total Protein: 7.2 g/dL (ref 6.0–8.3)

## 2013-12-18 LAB — HEMOGLOBIN A1C
HEMOGLOBIN A1C: 5.7 % — AB (ref <5.7)
Mean Plasma Glucose: 117 mg/dL — ABNORMAL HIGH (ref <117)

## 2013-12-18 LAB — TSH: TSH: 2.96 u[IU]/mL (ref 0.350–4.500)

## 2013-12-19 ENCOUNTER — Encounter: Payer: Self-pay | Admitting: Internal Medicine

## 2013-12-20 ENCOUNTER — Telehealth: Payer: Self-pay | Admitting: Emergency Medicine

## 2013-12-20 NOTE — Telephone Encounter (Signed)
Pt given lab results States voice has improved a little Walk in hours given if symptoms fail to improve

## 2013-12-20 NOTE — Telephone Encounter (Signed)
Message copied by Darlis Loan on Mon Dec 20, 2013  4:19 PM ------      Message from: Holland Commons A      Created: Sun Dec 19, 2013 11:59 PM       Normal labs. Find out if patient is feeling better and voice improving after steriods. ------

## 2014-01-06 ENCOUNTER — Ambulatory Visit: Payer: PRIVATE HEALTH INSURANCE | Attending: Internal Medicine | Admitting: *Deleted

## 2014-01-06 VITALS — BP 122/70

## 2014-01-06 DIAGNOSIS — I10 Essential (primary) hypertension: Secondary | ICD-10-CM

## 2014-01-06 NOTE — Progress Notes (Signed)
Patient ID: Martha Schneider, female   DOB: 03-Oct-1965, 48 y.o.   MRN: [id number] Patient is here today for a BP recheck. Patient BP is 122/70.

## 2014-05-30 ENCOUNTER — Other Ambulatory Visit: Payer: Self-pay | Admitting: Family Medicine

## 2014-05-30 ENCOUNTER — Other Ambulatory Visit (HOSPITAL_COMMUNITY)
Admission: RE | Admit: 2014-05-30 | Discharge: 2014-05-30 | Disposition: A | Discharge status: Home / Self Care | Payer: Managed Care, Other (non HMO) | Source: Ambulatory Visit | Attending: Family Medicine | Admitting: Family Medicine

## 2014-05-30 DIAGNOSIS — Z01419 Encounter for gynecological examination (general) (routine) without abnormal findings: Secondary | ICD-10-CM | POA: Insufficient documentation

## 2014-05-31 ENCOUNTER — Other Ambulatory Visit: Payer: Self-pay | Admitting: Family Medicine

## 2014-05-31 DIAGNOSIS — E2839 Other primary ovarian failure: Secondary | ICD-10-CM

## 2014-05-31 DIAGNOSIS — Z1231 Encounter for screening mammogram for malignant neoplasm of breast: Secondary | ICD-10-CM

## 2014-06-01 LAB — CYTOLOGY - PAP

## 2014-07-08 ENCOUNTER — Other Ambulatory Visit: Payer: PRIVATE HEALTH INSURANCE

## 2014-07-08 ENCOUNTER — Ambulatory Visit: Payer: PRIVATE HEALTH INSURANCE

## 2014-07-15 ENCOUNTER — Ambulatory Visit
Admission: RE | Admit: 2014-07-15 | Discharge: 2014-07-15 | Disposition: A | Discharge status: Home / Self Care | Payer: Commercial Indemnity | Source: Ambulatory Visit | Attending: Family Medicine | Admitting: Family Medicine

## 2014-07-15 DIAGNOSIS — E2839 Other primary ovarian failure: Secondary | ICD-10-CM

## 2014-07-15 DIAGNOSIS — Z1231 Encounter for screening mammogram for malignant neoplasm of breast: Secondary | ICD-10-CM

## 2015-10-02 ENCOUNTER — Other Ambulatory Visit: Payer: Self-pay | Admitting: Family Medicine

## 2015-10-02 ENCOUNTER — Ambulatory Visit
Admission: RE | Admit: 2015-10-02 | Discharge: 2015-10-02 | Disposition: A | Discharge status: Home / Self Care | Payer: Managed Care, Other (non HMO) | Source: Ambulatory Visit | Attending: Family Medicine | Admitting: Family Medicine

## 2015-10-02 DIAGNOSIS — R0602 Shortness of breath: Secondary | ICD-10-CM

## 2015-10-02 DIAGNOSIS — M5489 Other dorsalgia: Secondary | ICD-10-CM

## 2015-10-02 IMAGING — CR DG LUMBAR SPINE COMPLETE 4+V
5 series · 5 of 5 positions shown · non-contrast
Comparison: [DATE]

CLINICAL DATA: Low back pain mostly on the left, initial encounter

EXAM:
LUMBAR SPINE - COMPLETE 4+ VIEW

[t l-spine a.p.]
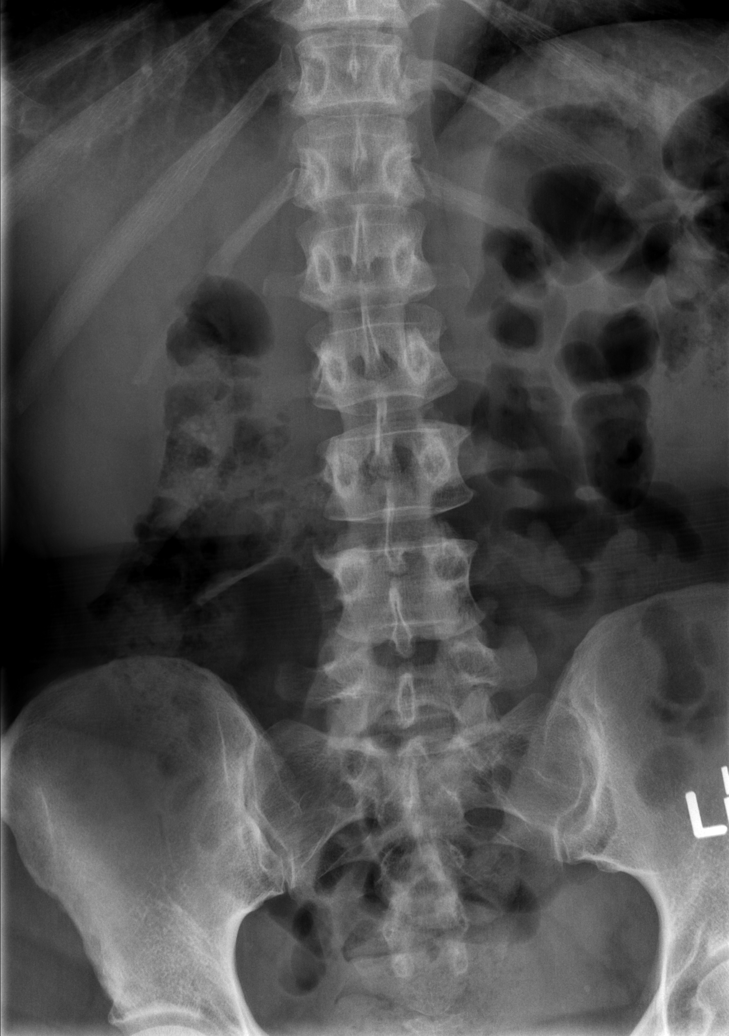

[t l-spine oblique exposure (1 of 2)]
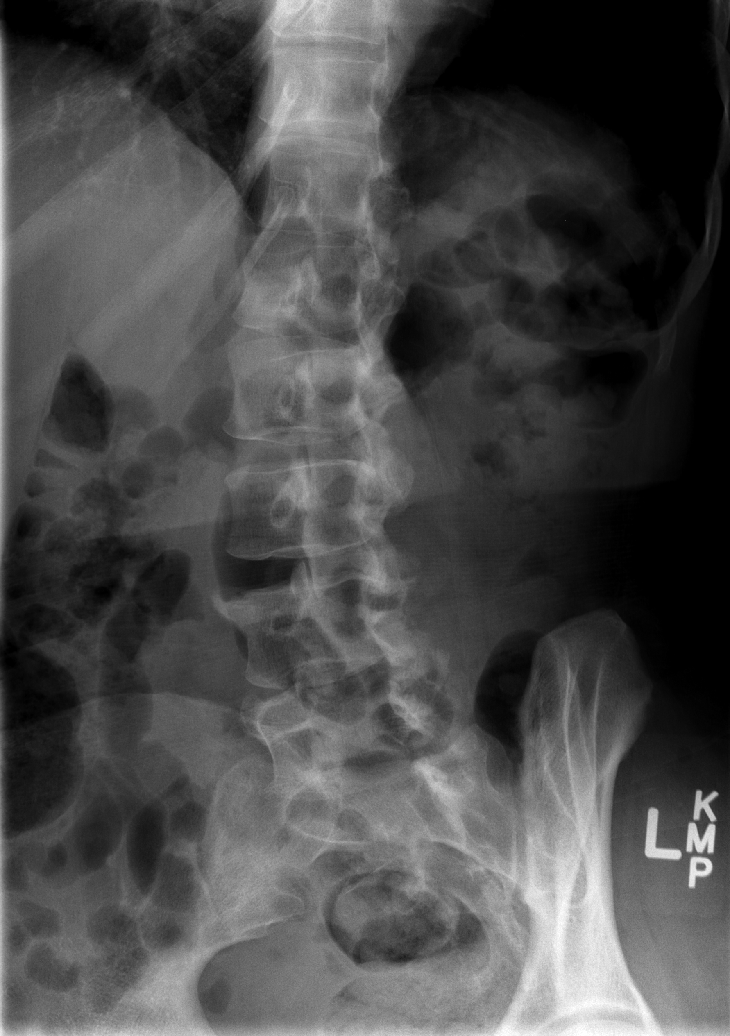

[t l-spine oblique exposure (2 of 2)]
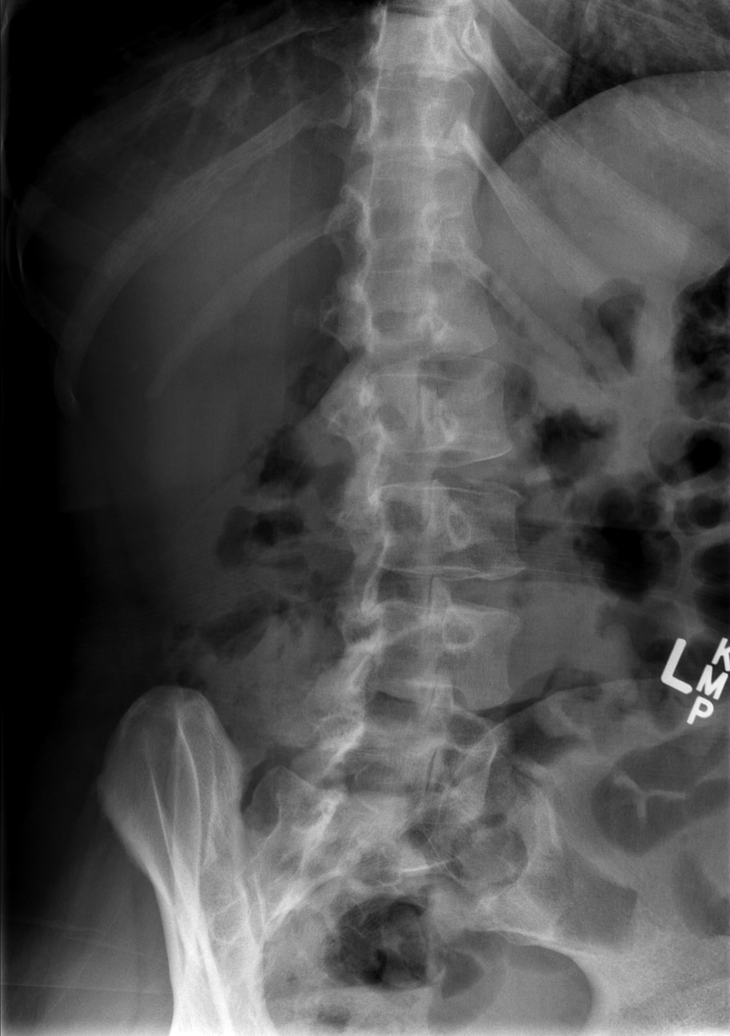

[t l-spine lat]
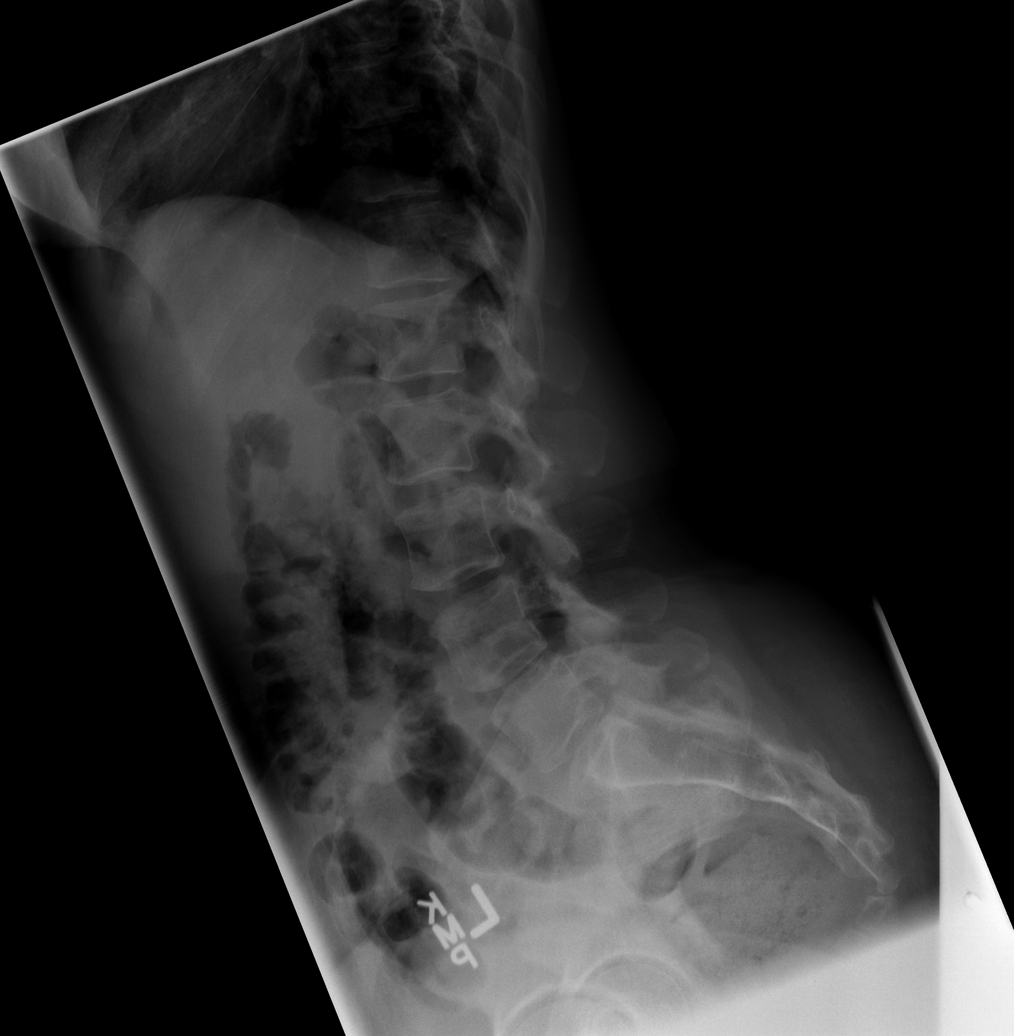

[t l-spine l5-s1 spot]
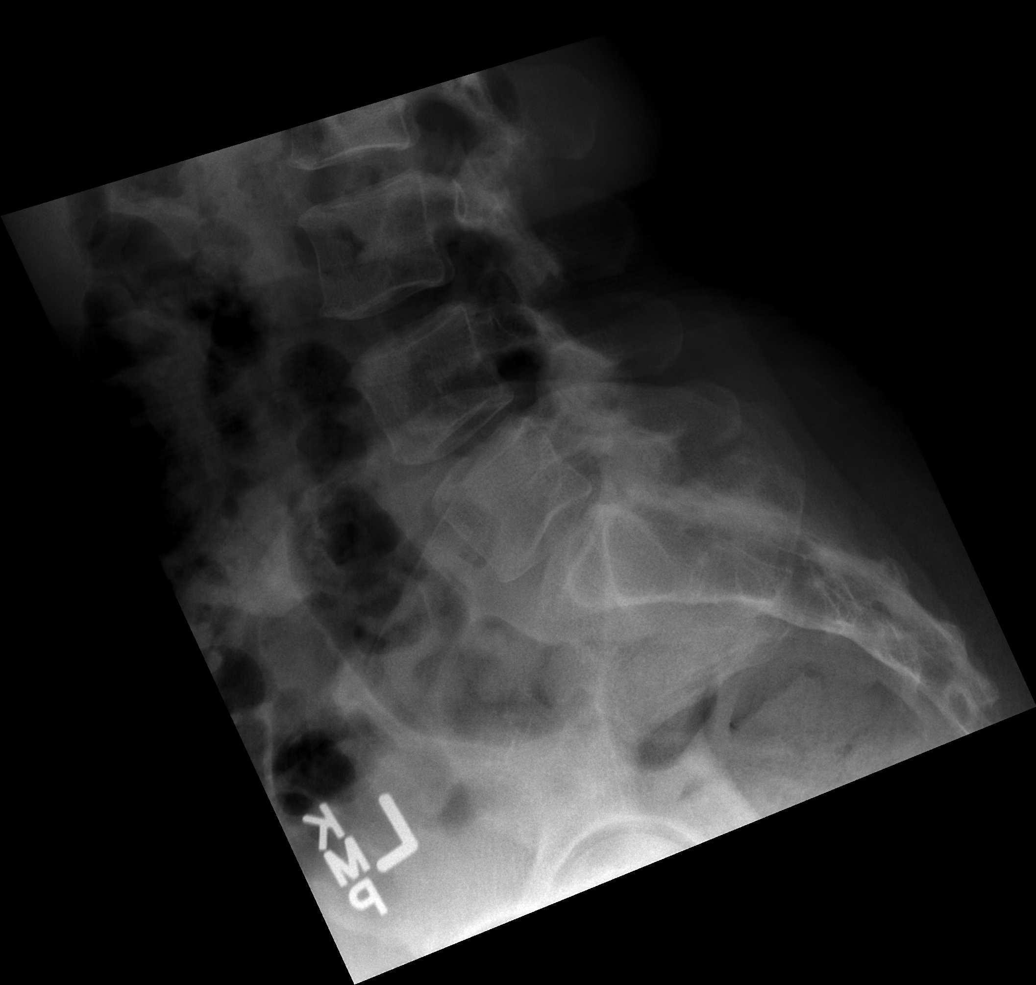

[5 of 5 positions shown; findings below may reference images not displayed]

FINDINGS: Five lumbar type vertebral bodies are well visualized. Vertebral
body height is well maintained. No pars defects are noted. Mild
osteophytic changes are seen. No compression deformities are noted.
Multiple calcifications are seen in the right upper quadrant
consistent with cholelithiasis.
IMPRESSION: Multiple gallstones.

Mild degenerative change of the lumbar spine.

## 2015-10-02 IMAGING — CR DG CHEST 2V
2 series · 2 of 2 positions shown · non-contrast
Comparison: [DATE]

CLINICAL DATA: Shortness of breath

EXAM:
CHEST  2 VIEW

[w chest pa]
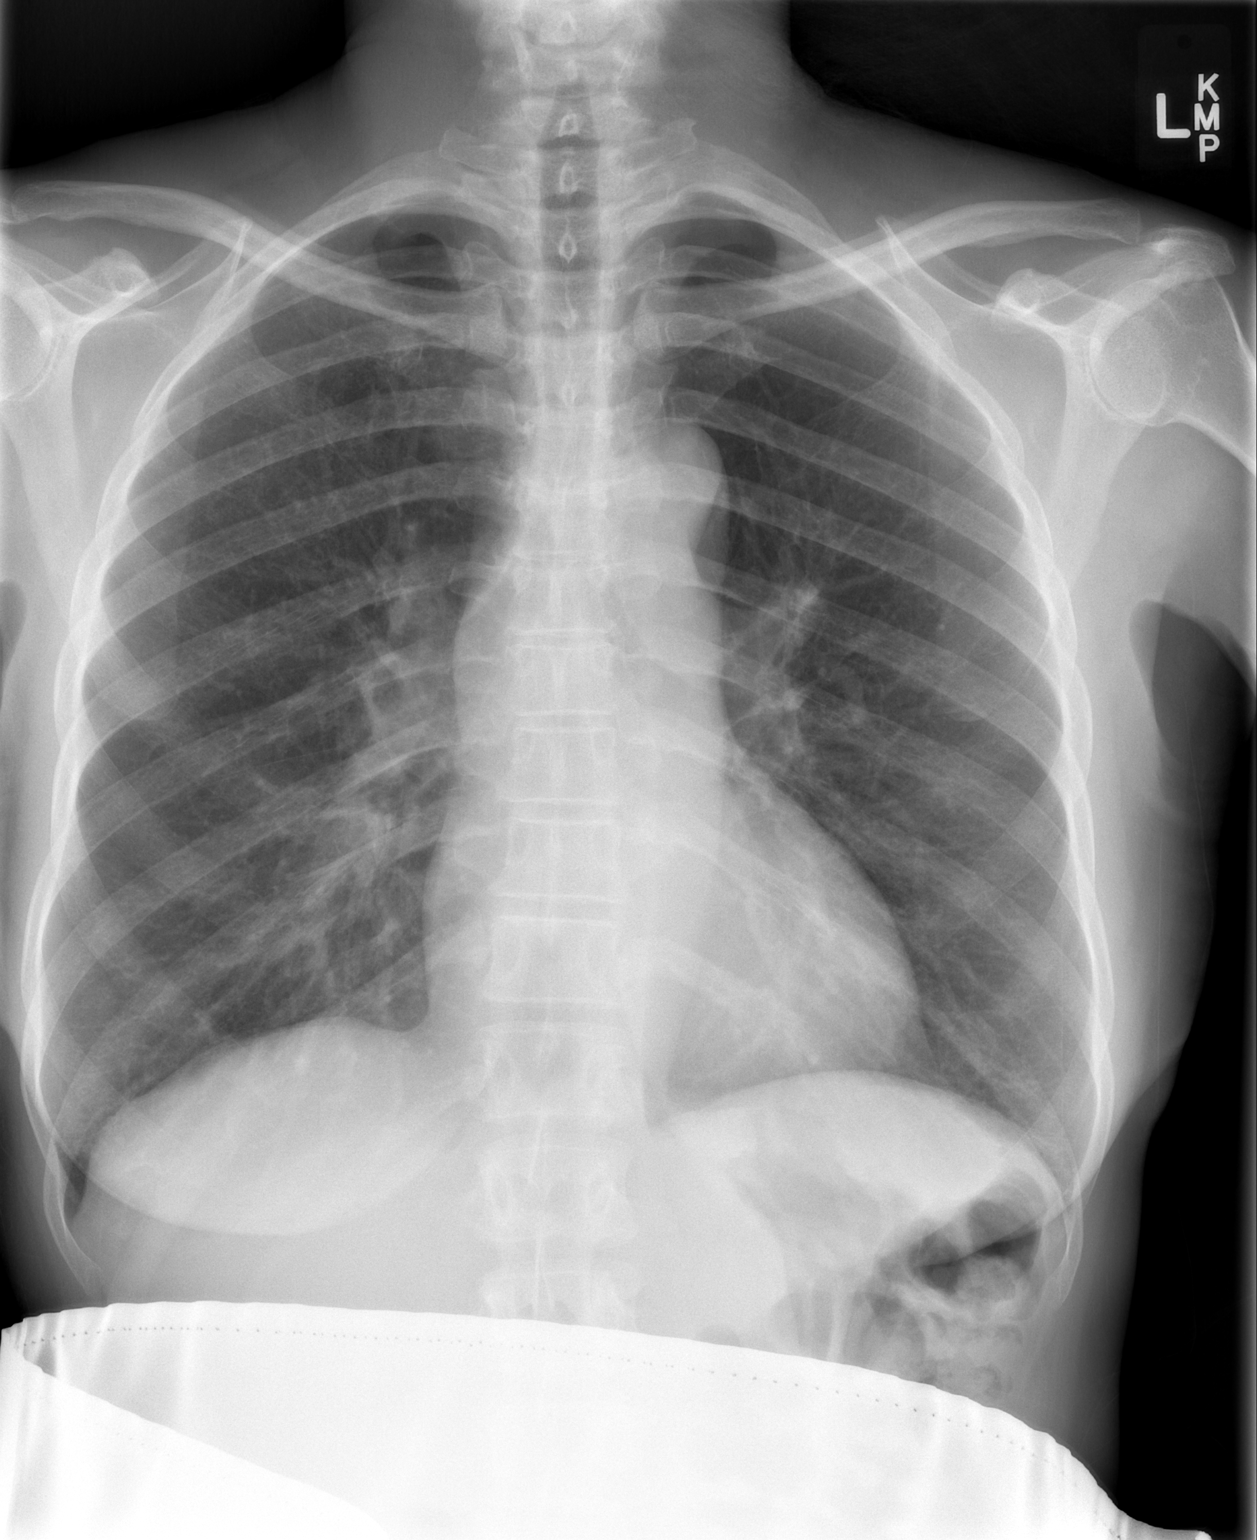

[w chest lat]
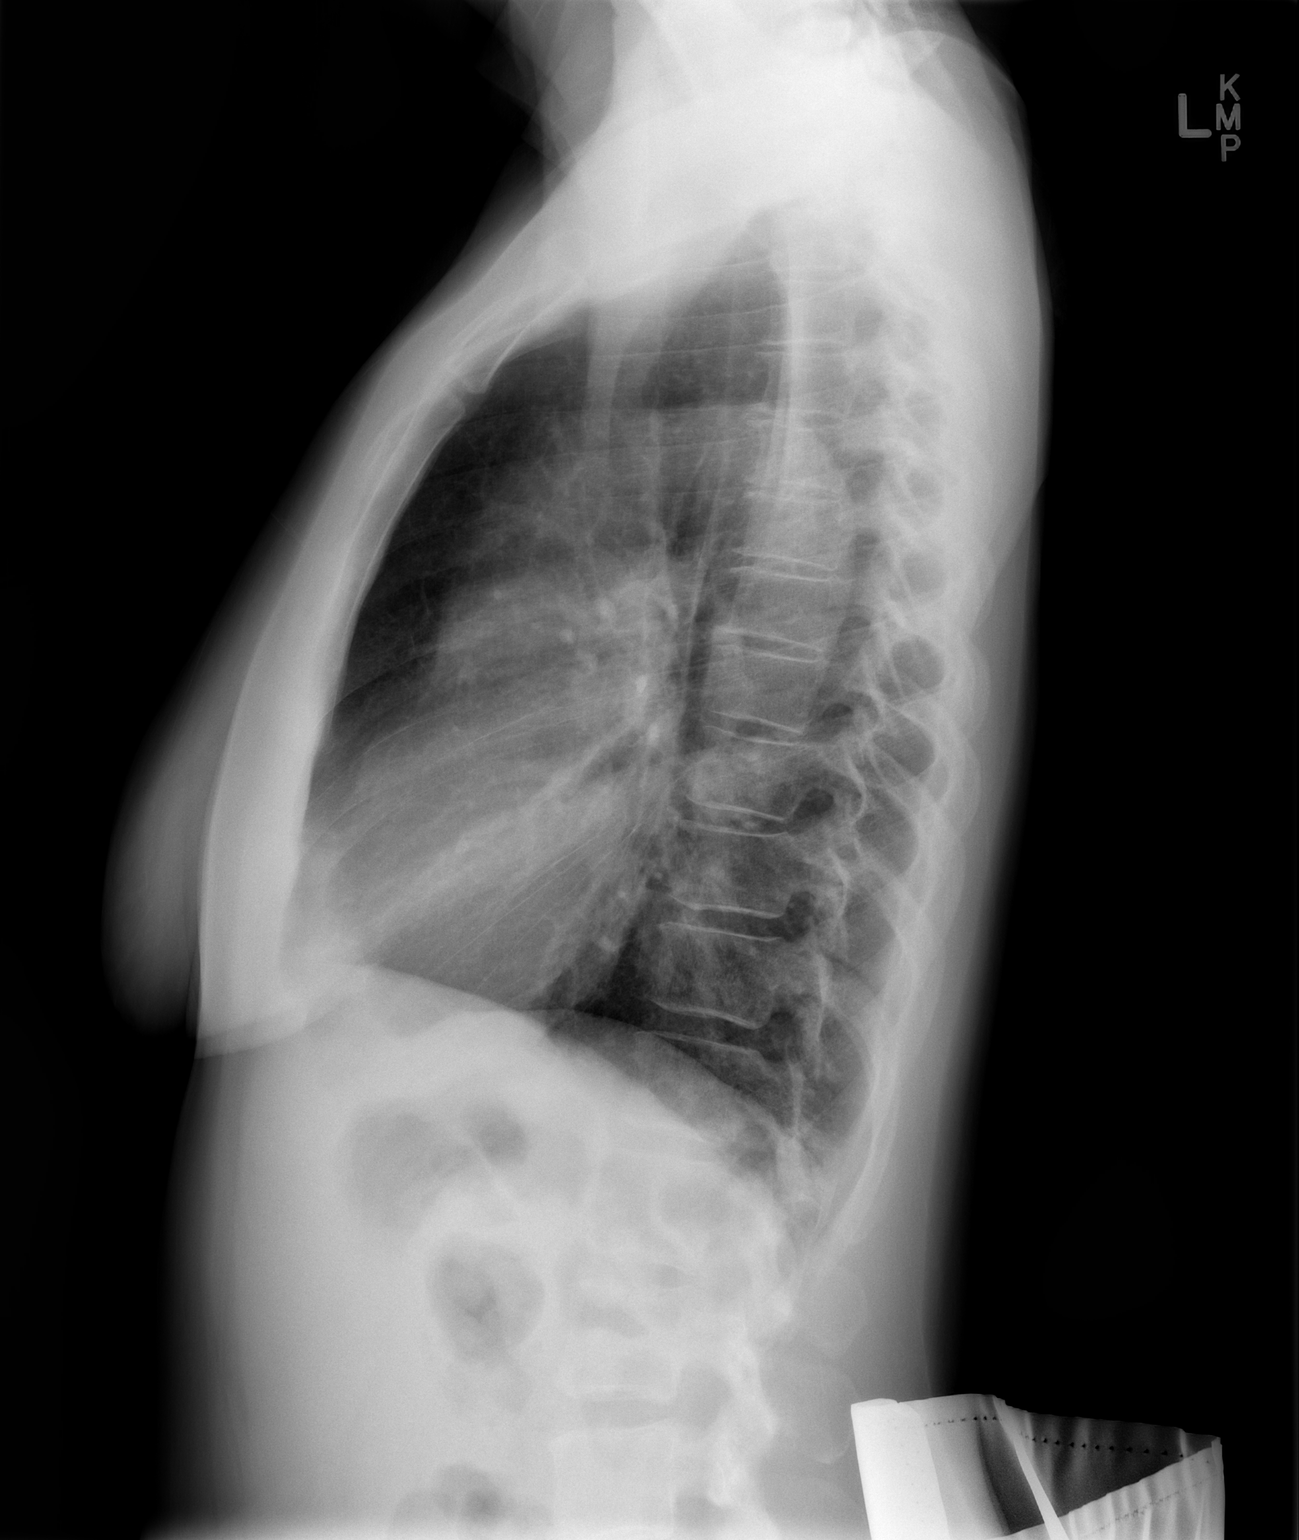

[2 of 2 positions shown; findings below may reference images not displayed]

FINDINGS: The heart size and mediastinal contours are within normal limits.
Both lungs are clear. The visualized skeletal structures are
unremarkable.
IMPRESSION: No active cardiopulmonary disease.

## 2015-10-10 ENCOUNTER — Other Ambulatory Visit: Payer: Self-pay | Admitting: Family Medicine

## 2015-10-10 DIAGNOSIS — R1011 Right upper quadrant pain: Secondary | ICD-10-CM

## 2015-10-17 ENCOUNTER — Other Ambulatory Visit: Payer: Managed Care, Other (non HMO)

## 2015-10-20 ENCOUNTER — Ambulatory Visit
Admission: RE | Admit: 2015-10-20 | Discharge: 2015-10-20 | Disposition: A | Discharge status: Home / Self Care | Payer: Managed Care, Other (non HMO) | Source: Ambulatory Visit | Attending: Family Medicine | Admitting: Family Medicine

## 2015-10-20 DIAGNOSIS — R1011 Right upper quadrant pain: Secondary | ICD-10-CM

## 2015-10-20 IMAGING — US US ABDOMEN LIMITED
1 series · 14 of 25 positions shown · non-contrast
Comparison: No recent prior.

CLINICAL DATA: Right upper quadrant pain.

EXAM:
US ABDOMEN LIMITED - RIGHT UPPER QUADRANT

[Series 1: us abdomen limited · 0.22mm/px · 14 of 30 slices shown]
[im 1/30]
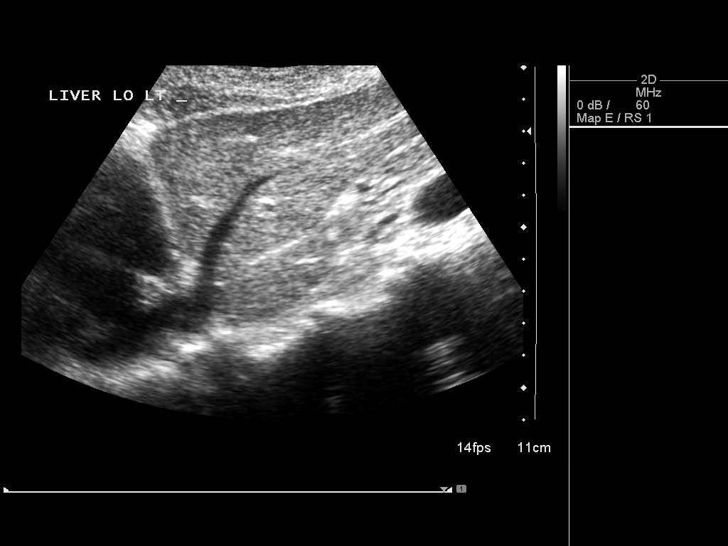
[im 3/30]
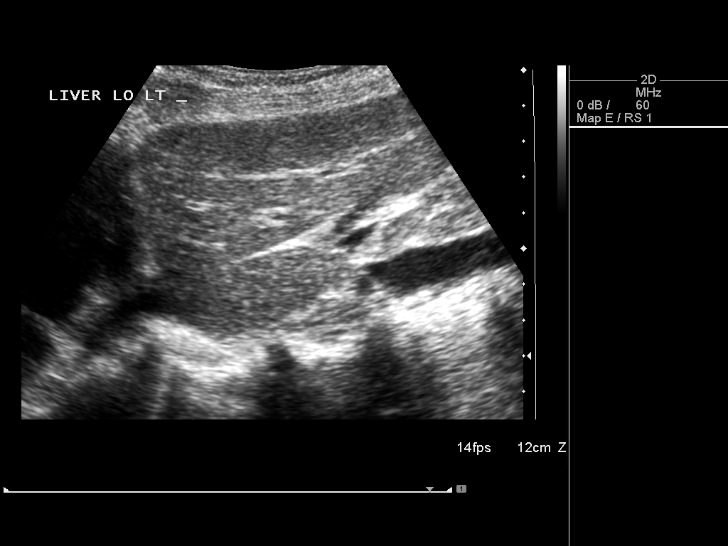
[im 5/30]
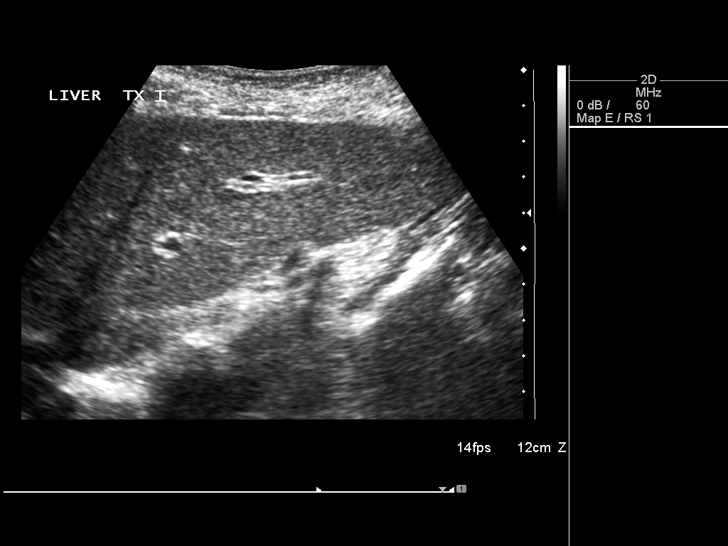
[im 8/30]
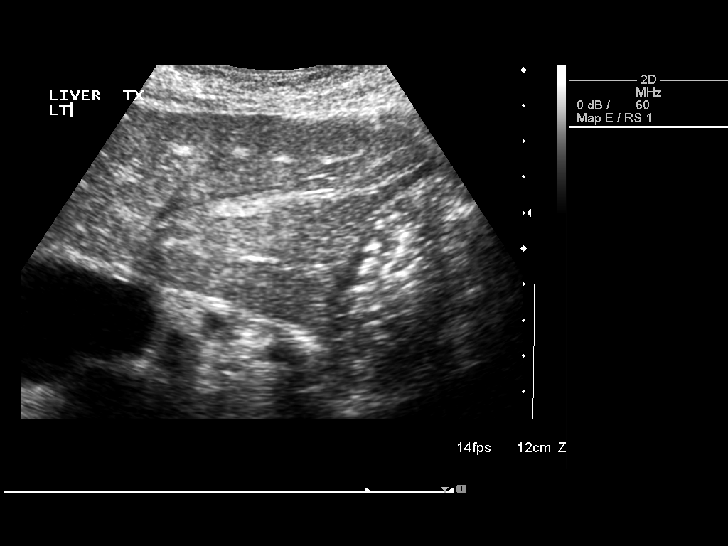
[im 10/30]
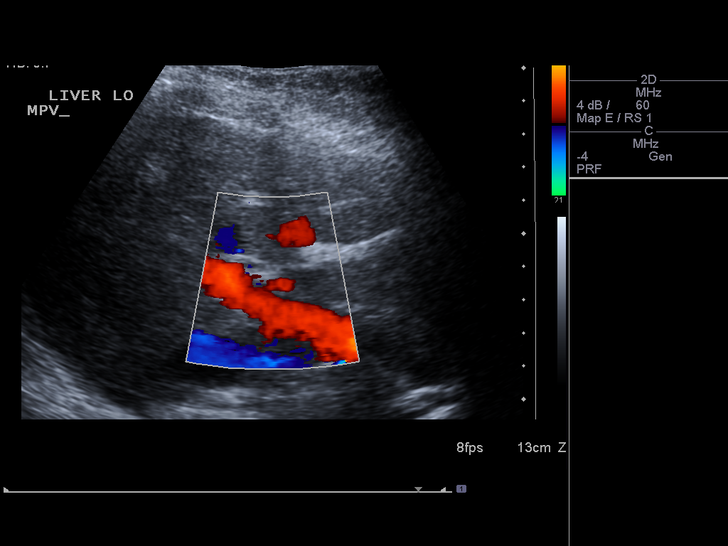
[im 11/30]
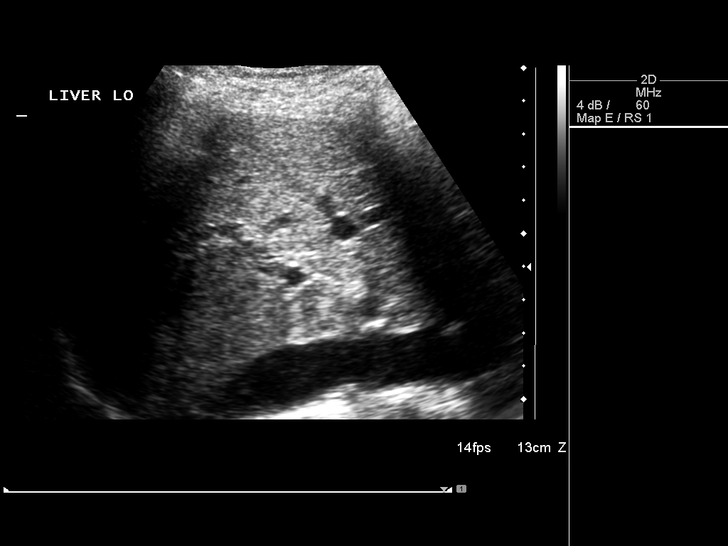
[im 14/30]
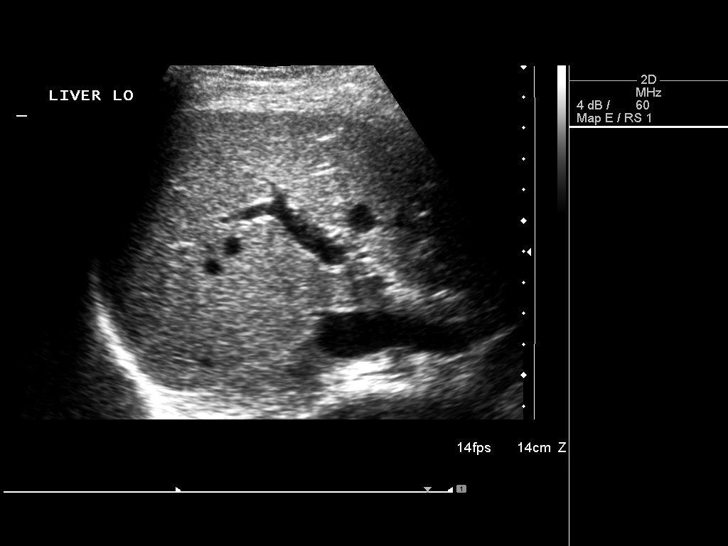
[im 16/30]
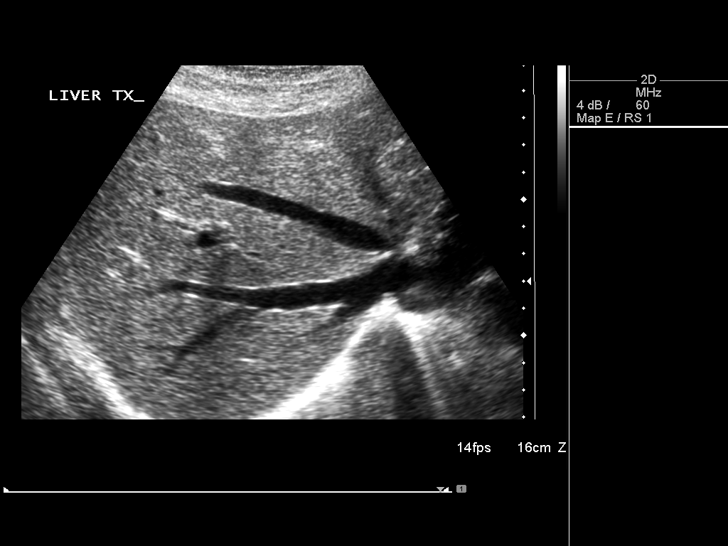
[im 19/30]
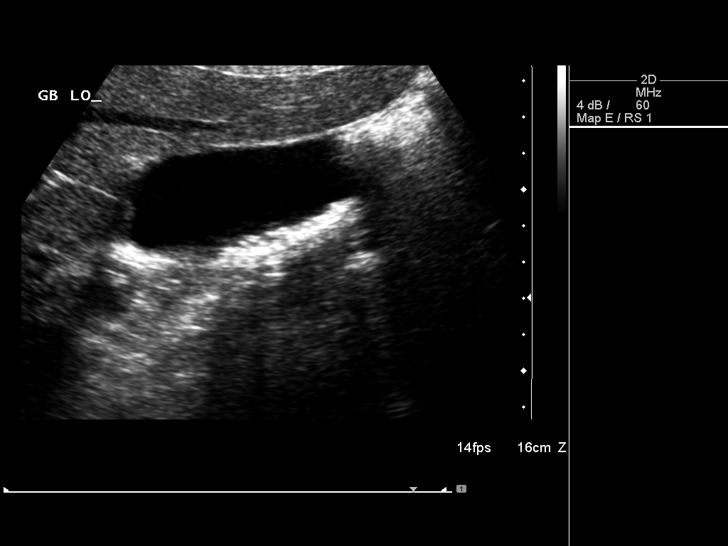
[im 20/30]
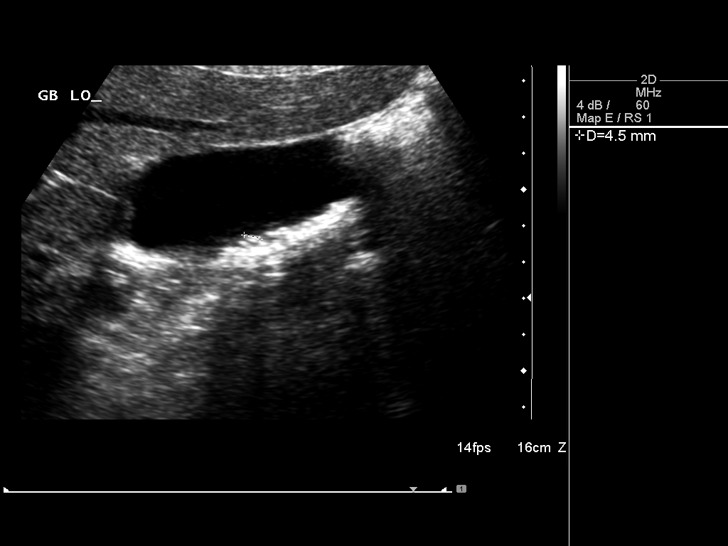
[im 22/30]
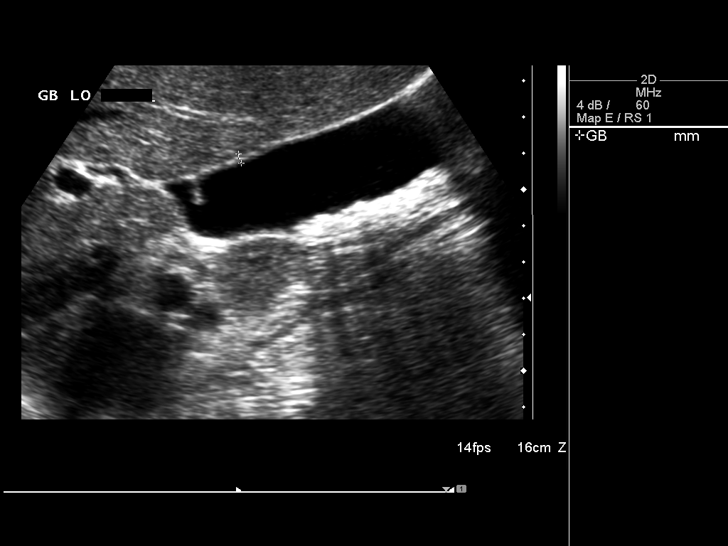
[im 25/30]
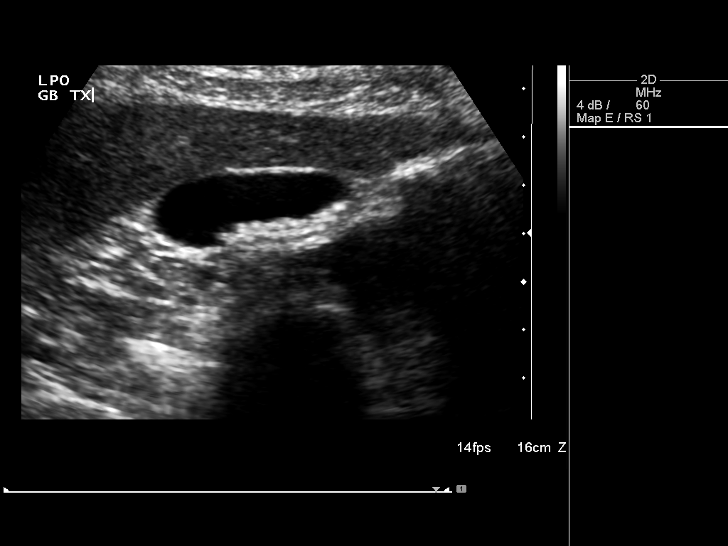
[im 27/30]
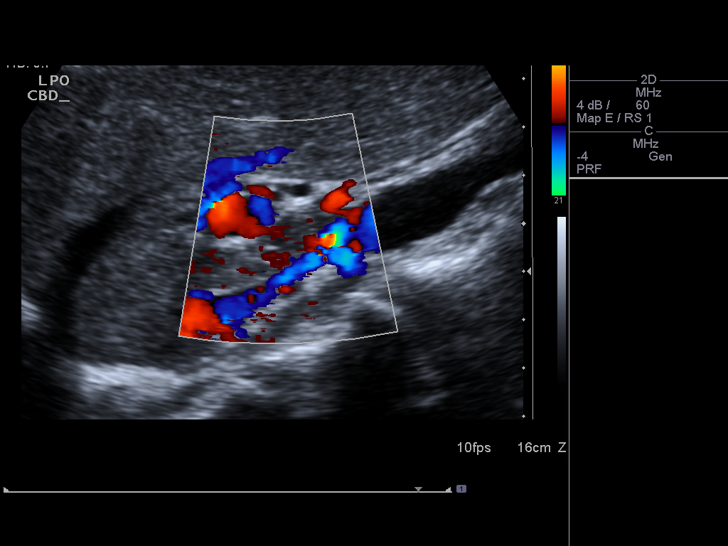
[im 30/30]
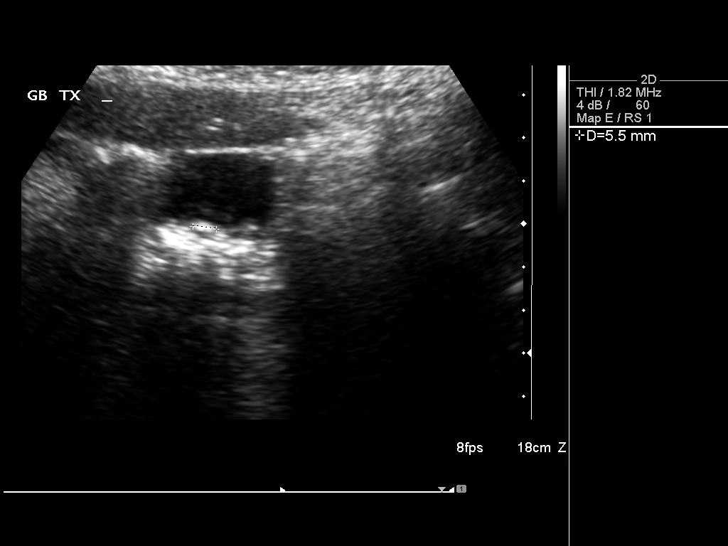

[14 of 25 positions shown; findings below may reference images not displayed]

FINDINGS: Gallbladder:

Multiple tiny gallstones noted. Gallbladder wall thickness 2.7 mm.
Mildly positive ultrasound Murphy sign noted .

Common bile duct:

Diameter: 2.7 mm

Liver:

No focal lesion identified. Within normal limits in parenchymal
echogenicity.
IMPRESSION: Multiple tiny gallstones noted. Gallbladder wall thickness 2.7 mm.
Mildly positive ultrasound Murphy sign noted. No biliary distention.

## 2015-11-27 ENCOUNTER — Ambulatory Visit: Payer: Self-pay | Admitting: Surgery

## 2018-02-20 DIAGNOSIS — F419 Anxiety disorder, unspecified: Secondary | ICD-10-CM | POA: Diagnosis not present

## 2018-02-20 DIAGNOSIS — I10 Essential (primary) hypertension: Secondary | ICD-10-CM | POA: Diagnosis not present

## 2018-02-20 DIAGNOSIS — Z23 Encounter for immunization: Secondary | ICD-10-CM | POA: Diagnosis not present

## 2018-05-22 DIAGNOSIS — G5601 Carpal tunnel syndrome, right upper limb: Secondary | ICD-10-CM | POA: Diagnosis not present

## 2018-05-22 DIAGNOSIS — F411 Generalized anxiety disorder: Secondary | ICD-10-CM | POA: Diagnosis not present

## 2018-05-22 DIAGNOSIS — J381 Polyp of vocal cord and larynx: Secondary | ICD-10-CM | POA: Diagnosis not present

## 2018-05-22 DIAGNOSIS — R490 Dysphonia: Secondary | ICD-10-CM | POA: Diagnosis not present

## 2018-06-23 DIAGNOSIS — G5601 Carpal tunnel syndrome, right upper limb: Secondary | ICD-10-CM | POA: Diagnosis not present

## 2018-06-23 DIAGNOSIS — I10 Essential (primary) hypertension: Secondary | ICD-10-CM | POA: Diagnosis not present

## 2018-06-23 DIAGNOSIS — R42 Dizziness and giddiness: Secondary | ICD-10-CM | POA: Diagnosis not present

## 2018-06-23 DIAGNOSIS — S0093XA Contusion of unspecified part of head, initial encounter: Secondary | ICD-10-CM | POA: Diagnosis not present

## 2018-06-23 DIAGNOSIS — W108XXA Fall (on) (from) other stairs and steps, initial encounter: Secondary | ICD-10-CM | POA: Diagnosis not present

## 2018-09-15 DIAGNOSIS — J029 Acute pharyngitis, unspecified: Secondary | ICD-10-CM | POA: Diagnosis not present

## 2019-01-05 DIAGNOSIS — Z20828 Contact with and (suspected) exposure to other viral communicable diseases: Secondary | ICD-10-CM | POA: Diagnosis not present

## 2019-01-12 DIAGNOSIS — Z20828 Contact with and (suspected) exposure to other viral communicable diseases: Secondary | ICD-10-CM | POA: Diagnosis not present

## 2019-01-18 DIAGNOSIS — Z20828 Contact with and (suspected) exposure to other viral communicable diseases: Secondary | ICD-10-CM | POA: Diagnosis not present

## 2019-01-25 DIAGNOSIS — Z20828 Contact with and (suspected) exposure to other viral communicable diseases: Secondary | ICD-10-CM | POA: Diagnosis not present

## 2019-02-08 DIAGNOSIS — Z20828 Contact with and (suspected) exposure to other viral communicable diseases: Secondary | ICD-10-CM | POA: Diagnosis not present

## 2019-02-15 DIAGNOSIS — Z20828 Contact with and (suspected) exposure to other viral communicable diseases: Secondary | ICD-10-CM | POA: Diagnosis not present

## 2019-02-22 DIAGNOSIS — Z20828 Contact with and (suspected) exposure to other viral communicable diseases: Secondary | ICD-10-CM | POA: Diagnosis not present

## 2019-04-08 ENCOUNTER — Other Ambulatory Visit: Payer: Self-pay | Admitting: Family Medicine

## 2019-04-08 DIAGNOSIS — Z1231 Encounter for screening mammogram for malignant neoplasm of breast: Secondary | ICD-10-CM

## 2019-09-17 ENCOUNTER — Other Ambulatory Visit (HOSPITAL_COMMUNITY)
Admission: RE | Admit: 2019-09-17 | Discharge: 2019-09-17 | Disposition: A | Discharge status: Home / Self Care | Payer: BC Managed Care – PPO | Source: Ambulatory Visit | Attending: Family Medicine | Admitting: Family Medicine

## 2019-09-17 ENCOUNTER — Other Ambulatory Visit: Payer: Self-pay | Admitting: Family Medicine

## 2019-09-17 DIAGNOSIS — Z01411 Encounter for gynecological examination (general) (routine) with abnormal findings: Secondary | ICD-10-CM | POA: Diagnosis not present

## 2019-09-17 DIAGNOSIS — I10 Essential (primary) hypertension: Secondary | ICD-10-CM | POA: Diagnosis not present

## 2019-09-17 DIAGNOSIS — Z23 Encounter for immunization: Secondary | ICD-10-CM | POA: Diagnosis not present

## 2019-09-17 DIAGNOSIS — Z0000 Encounter for general adult medical examination without abnormal findings: Secondary | ICD-10-CM | POA: Diagnosis not present

## 2019-09-22 ENCOUNTER — Telehealth: Payer: Self-pay | Admitting: Adult Health

## 2019-09-22 LAB — CYTOLOGY - PAP
Comment: NEGATIVE
Diagnosis: NEGATIVE
High risk HPV: NEGATIVE

## 2019-09-22 NOTE — Telephone Encounter (Signed)
Received a new hem referral from Dr. Docia Chuck at [facility] for persistent leukopenia. Pt has been cld and schedueld to see Mardella Layman on 6/2 at 9am w/labs at 830am.

## 2019-09-23 DIAGNOSIS — Z0000 Encounter for general adult medical examination without abnormal findings: Secondary | ICD-10-CM | POA: Diagnosis not present

## 2019-09-25 DIAGNOSIS — D709 Neutropenia, unspecified: Secondary | ICD-10-CM | POA: Insufficient documentation

## 2019-09-25 DIAGNOSIS — F17200 Nicotine dependence, unspecified, uncomplicated: Secondary | ICD-10-CM | POA: Insufficient documentation

## 2019-09-25 NOTE — Progress Notes (Deleted)
[place]  [phone] [phone]     ID: Martha Schneider DOB: [birth year]  MR#: [id number]  TDD#:[id number]  No care team member to display Martha Dock, NP OTHER MD:  CHIEF COMPLAINT:   CURRENT TREATMENT:    HISTORY OF CURRENT ILLNESS:   The patient's subsequent history is as detailed below.  INTERVAL HISTORY: {The patient} was evaluated in the {multidisciplinary} breast cancer clinic on {date} {accompanied by              }. Her case was also presented at the multidisciplinary breast cancer conference on the same day. At that time a preliminary plan was proposed:   REVIEW OF SYSTEMS:  PAST MEDICAL HISTORY: Past Medical History:  Diagnosis Date  . Hypertension   . Sarcoidosis     PAST SURGICAL HISTORY: No past surgical history on file.  FAMILY HISTORY No family history on file.  GYNECOLOGIC HISTORY:  No LMP recorded. Patient is postmenopausal. Menarche: *** years old Age at first live birth: *** years old [place] P *** LMP *** Contraceptive*** HRT ***  Hysterectomy? *** Salpingo-oophorectomy?***    SOCIAL HISTORY:      ADVANCED DIRECTIVES:    HEALTH MAINTENANCE: Social History   Tobacco Use  . Smoking status: Current Every Day Smoker    Last attempt to quit: 07/26/2013    Years since quitting: 6.1  Substance Use Topics  . Alcohol use: Yes    Comment: occasionally  . Drug use: No     Colonoscopy:  PAP:  Bone density:   No Known Allergies  Current Outpatient Medications  Medication Sig Dispense Refill  . amLODipine (NORVASC) 10 MG tablet Take 1 tablet (10 mg total) by mouth daily. 30 tablet 0  . aspirin 325 MG tablet Take 325 mg by mouth daily.    Marland Kitchen dextromethorphan-guaiFENesin (MUCINEX DM) 30-600 MG per 12 hr tablet Take 1 tablet by mouth 2 (two) times daily as needed for cough.    . hydrochlorothiazide (HYDRODIURIL) 25 MG tablet Take 1 tablet (25 mg total) by mouth daily. 30 tablet 0  . Multiple  Vitamins-Minerals (MULTIVITAMIN WITH MINERALS) tablet Take 1 tablet by mouth daily.    . predniSONE (DELTASONE) 20 MG tablet 3 tabs po day one, then 2 tabs daily x 4 days 11 tablet 0  . predniSONE (DELTASONE) 20 MG tablet Take 1 tablet (20 mg total) by mouth daily with breakfast. 3 tabs daily for 3 days, 2 tabs for 2 days, 1 tablet for 2 days 15 tablet 0   No current facility-administered medications for this visit.    OBJECTIVE:  There were no vitals filed for this visit.   There is no height or weight on file to calculate BMI.   Wt Readings from Last 3 Encounters:  12/17/13 148 lb 6 oz (67.3 kg)      ECOG FS:{CHL ONC WC:[id number]}  Ocular: Sclerae unicteric, pupils round and equal Ear-nose-throat: Oropharynx clear and moist Lymphatic: No cervical or supraclavicular adenopathy Lungs no rales or rhonchi Heart regular rate and rhythm Abd soft, nontender, positive bowel sounds MSK no focal spinal tenderness, no joint edema Neuro: non-focal, well-oriented, appropriate affect Breasts:    LAB RESULTS:  CMP     Component Value Date/Time   NA 138 12/17/2013 1653   K 4.1 12/17/2013 1653   CL 102 12/17/2013 1653   CO2 30 12/17/2013 1653   GLUCOSE 95 12/17/2013 1653   BUN 12 12/17/2013 1653   CREATININE 0.76 12/17/2013 1653  CALCIUM 8.9 12/17/2013 1653   PROT 7.2 12/17/2013 1653   ALBUMIN 4.2 12/17/2013 1653   AST 21 12/17/2013 1653   ALT 14 12/17/2013 1653   ALKPHOS 54 12/17/2013 1653   BILITOT 0.5 12/17/2013 1653   GFRNONAA >89 12/17/2013 1653   GFRAA >89 12/17/2013 1653    No results found for: TOTALPROTELP, ALBUMINELP, A1GS, A2GS, BETS, BETA2SER, GAMS, MSPIKE, SPEI  No results found for: [facility]  Lab Results  Component Value Date   WBC 2.1 (L) 12/17/2013   NEUTROABS 1.4 (L) 02/06/2007   HGB 12.5 12/17/2013   HCT 37.0 12/17/2013   MCV 83.7 12/17/2013   PLT 216 12/17/2013      Chemistry      Component Value Date/Time   NA 138  12/17/2013 1653   K 4.1 12/17/2013 1653   CL 102 12/17/2013 1653   CO2 30 12/17/2013 1653   BUN 12 12/17/2013 1653   CREATININE 0.76 12/17/2013 1653      Component Value Date/Time   CALCIUM 8.9 12/17/2013 1653   ALKPHOS 54 12/17/2013 1653   AST 21 12/17/2013 1653   ALT 14 12/17/2013 1653   BILITOT 0.5 12/17/2013 1653       No results found for: LABCA2  No components found for: TIWPYK998  No results for input(s): INR in the last 168 hours.  No results found for: LABCA2  No results found for: PJA250  No results found for: NLZ767  No results found for: HAL937  No results found for: CA2729  No components found for: HGQUANT  No results found for: CEA1 / No results found for: CEA1   No results found for: AFPTUMOR  No results found for: CHROMOGRNA  No results found for: PSA1  No visits with results within 3 Day(s) from this visit.  Latest known visit with results is:  Orders Only on 09/17/2019  Component Date Value Ref Range Status  . High risk HPV 09/17/2019 Negative   Final  . Adequacy 09/17/2019 Satisfactory for evaluation; transformation zone component PRESENT.   Final  . Diagnosis 09/17/2019 - Negative for intraepithelial lesion or malignancy (NILM)   Final  . Comment 09/17/2019 A letter was sent to the patient informing her of the above results.   Final  . Comment 09/17/2019 Normal Reference Range HPV - Negative   Final    (this displays the last labs from the last 3 days)  No results found for: TOTALPROTELP, ALBUMINELP, A1GS, A2GS, BETS, BETA2SER, GAMS, MSPIKE, SPEI (this displays SPEP labs)  No results found for: KPAFRELGTCHN, LAMBDASER, KAPLAMBRATIO (kappa/lambda light chains)  No results found for: HGBA, HGBA2QUANT, HGBFQUANT, HGBSQUAN (Hemoglobinopathy evaluation)   No results found for: LDH  No results found for: IRON, TIBC, IRONPCTSAT (Iron and TIBC)  No results found for: FERRITIN  Urinalysis    Component Value Date/Time   BILIRUBINUR  Negative 12/17/2013 1608   PROTEINUR Negaitve 12/17/2013 1608   UROBILINOGEN 0.2 12/17/2013 1608   NITRITE Negative 12/17/2013 1608   LEUKOCYTESUR Negative 12/17/2013 1608     STUDIES: No results found.  ELIGIBLE FOR AVAILABLE RESEARCH PROTOCOL: ***  ASSESSMENT: 54 y.o.   PLAN: I spent approximately 60 minutes face to face with Martha Schneider with more than 50% of that time spent in counseling and coordination of care. Specifically we reviewed the biology of the patient's diagnosis and the specifics of her situation.      Shaletha has a good understanding of the overall plan. She agrees with it. She knows the goal  of treatment in her case is cure. She will call with any problems that may develop before her next visit here.   Total encounter time: *** minutes  Lillard Anes, NP 09/25/19 3:12 PM Medical Oncology and Hematology [facility] [address] [place], [place] [address] Tel. [phone]    Fax. [phone]  *Total Encounter Time as defined by the Centers for Medicare and Medicaid Services includes, in addition to the face-to-face time of a patient visit (documented in the note above) non-face-to-face time: obtaining and reviewing outside history, ordering and reviewing medications, tests or procedures, care coordination (communications with other health care professionals or caregivers) and documentation in the medical record.

## 2019-09-28 ENCOUNTER — Telehealth: Payer: Self-pay | Admitting: Hematology and Oncology

## 2019-09-28 NOTE — Telephone Encounter (Signed)
Martha Schneider cld to reschedule her new hem appt to 6/23 at 9am w/Dr. Leonides Schanz. Pt aware to arrive 15 minutes early.

## 2019-09-29 ENCOUNTER — Inpatient Hospital Stay: Payer: Managed Care, Other (non HMO)

## 2019-09-29 ENCOUNTER — Inpatient Hospital Stay (HOSPITAL_BASED_OUTPATIENT_CLINIC_OR_DEPARTMENT_OTHER): Payer: Self-pay | Admitting: Adult Health

## 2019-09-29 DIAGNOSIS — D708 Other neutropenia: Secondary | ICD-10-CM

## 2019-09-29 DIAGNOSIS — F17200 Nicotine dependence, unspecified, uncomplicated: Secondary | ICD-10-CM

## 2019-09-30 NOTE — Progress Notes (Signed)
error 

## 2019-10-19 NOTE — Progress Notes (Signed)
[place] Telephone:([phone]   [phone]) [phone]  INITIAL CONSULT NOTE  Patient Care Team: Patient, No Pcp Per as PCP - General (General Practice)  Hematological/Oncological History # Leukopenia, Neutropenia 1) 02/06/2007: WBC 3.0, Hgb 13.4, Plt 280. ANC 1400 2) 08/02/2013: WBC 2.0, Hgb 13.4, Plt 126 3) 12/17/2013: WBC 2.1, Hgb 12.5, Plt 216 4) 09/20/2019: WBC 2.1, Hgb 12.4, MCV 86, Plt 231, ANC 700 5) 10/19/2019: Establish care with Dr. Lorenso Courier   CHIEF COMPLAINTS/PURPOSE OF CONSULTATION:  "Leukopenia"  HISTORY OF PRESENTING ILLNESS:  Martha Schneider 54 y.o. female with medical history significant for HTN and sarcoidosis who presents for evaluation of acute on chronic sarcoidosis.   On review of the previous records Martha Schneider had a CBC drawn on 02/06/2007 which is the first we have on record.  At that time she was noted to have white blood cell count 3.0, hemoglobin 13.4, and an ANC of 1400, and the platelet count of 280.  More recently in April 2015 the patient was found have white blood cell count of 2.0, hemoglobin 13.4, platelet count of 126.  Also from outside records on 09/20/2019 the patient was found have white blood cell count of 2.1, hemoglobin 12.4, MCV of 86, platelets of 231 and an ANC of 700.  Due to concern for this patient's acute on chronic leukopenia she was referred to hematology for further evaluation and management.  On exam today the patient notes that she has had a longstanding history of leukopenia.  She reports that her primary doctor in [place] brought this up prior to her move here to [place].  She also notes that she has not been on any medication for her sarcoidosis after moving.  She reports that she was previously on some prednisone therapy and that this was many years ago.  She notes that she does get some shortness of breath when walking up the steps at her home, reporting that is approximately 15 steps.  She reports that she has good  energy levels and that her appetite has been okay, but she does not eat as much as she would like because she is too busy.  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  She also notes that she smokes approximately 10 cigarettes/day and that she has been doing this since the age of 46.  She is interested in quitting.  She also drinks approximately one sixpack of alcohol per week.  She works as a Quarry manager at a [employer].  Her family history is not remarkable for any blood conditions or blood cancers.  On further discussion the patient notes that she has ill fitting dentures and has lost approximately 40 pounds over the last year.  She reports that she is also been having some issues with hair loss and that she has been eating softer foods due to her dental issues.  She does that she eats very little in the way of red meat but does eat some chicken and a lot of vegetables.  She currently denies having any issues with fevers, chills, sweats, nausea vomiting or diarrhea.  A full 10 point ROS is listed below.  MEDICAL HISTORY:  Past Medical History:  Diagnosis Date  . Hypertension   . Sarcoidosis     SURGICAL HISTORY: History reviewed. No pertinent surgical history.  SOCIAL HISTORY: Social History   Socioeconomic History  . Marital status: Married    Spouse name: Not on file  .  Number of children: 2  . Years of education: Not on file  . Highest education level: Not on file  Occupational History  . Not on file  Tobacco Use  . Smoking status: Current Every Day Smoker    Packs/day: 0.50    Years: 36.00    Pack years: 18.00    Last attempt to quit: 07/26/2013    Years since quitting: 6.2  . Smokeless tobacco: Never Used  Vaping Use  . Vaping Use: Never used  Substance and Sexual Activity  . Alcohol use: Yes    Comment: occasionally  . Drug use: No  . Sexual activity: Not on file  Other Topics Concern  . Not on file  Social History Narrative  .  Not on file   Social Determinants of Health   Financial Resource Strain:   . Difficulty of Paying Living Expenses:   Food Insecurity:   . Worried About Charity fundraiser in the Last Year:   . Arboriculturist in the Last Year:   Transportation Needs:   . Film/video editor (Medical):   Marland Kitchen Lack of Transportation (Non-Medical):   Physical Activity:   . Days of Exercise per Week:   . Minutes of Exercise per Session:   Stress:   . Feeling of Stress :   Social Connections:   . Frequency of Communication with Friends and Family:   . Frequency of Social Gatherings with Friends and Family:   . Attends Religious Services:   . Active Member of Clubs or Organizations:   . Attends Archivist Meetings:   Marland Kitchen Marital Status:   Intimate Partner Violence:   . Fear of Current or Ex-Partner:   . Emotionally Abused:   Marland Kitchen Physically Abused:   . Sexually Abused:     FAMILY HISTORY: Family History  Problem Relation Age of Onset  . Heart attack Mother   . Liver cancer Father   . Diabetes Mellitus II Sister   . Kidney disease Sister     ALLERGIES:  has No Known Allergies.  MEDICATIONS:  Current Outpatient Medications  Medication Sig Dispense Refill  . Omega-3 Fatty Acids (FISH OIL) 1000 MG CAPS Take by mouth.    Marland Kitchen amLODipine (NORVASC) 10 MG tablet Take 1 tablet (10 mg total) by mouth daily. 30 tablet 0  . aspirin 325 MG tablet Take 325 mg by mouth daily.    Marland Kitchen dextromethorphan-guaiFENesin (MUCINEX DM) 30-600 MG per 12 hr tablet Take 1 tablet by mouth 2 (two) times daily as needed for cough.    . hydrochlorothiazide (HYDRODIURIL) 25 MG tablet Take 1 tablet (25 mg total) by mouth daily. 30 tablet 0  . Multiple Vitamins-Minerals (MULTIVITAMIN WITH MINERALS) tablet Take 1 tablet by mouth daily.     No current facility-administered medications for this visit.    REVIEW OF SYSTEMS:   Constitutional: ( - ) fevers, ( - )  chills , ( - ) night sweats Eyes: ( - ) blurriness of  vision, ( - ) double vision, ( - ) watery eyes Ears, nose, mouth, throat, and face: ( - ) mucositis, ( - ) sore throat Respiratory: ( - ) cough, ( - ) dyspnea, ( - ) wheezes Cardiovascular: ( - ) palpitation, ( - ) chest discomfort, ( - ) lower extremity swelling Gastrointestinal:  ( - ) nausea, ( - ) heartburn, ( - ) change in bowel habits Skin: ( - ) abnormal skin rashes Lymphatics: ( - ) new lymphadenopathy, ( - )  easy bruising Neurological: ( - ) numbness, ( - ) tingling, ( - ) new weaknesses Behavioral/Psych: ( - ) mood change, ( - ) new changes  All other systems were reviewed with the patient and are negative.  PHYSICAL EXAMINATION:  Vitals:   10/20/19 0935  BP: (!) 145/95  Pulse: 82  Resp: 18  Temp: 97.6 F (36.4 C)  SpO2: 99%   Filed Weights   10/20/19 0935  Weight: 120 lb 6.4 oz (54.6 kg)    GENERAL: well appearing middle aged [place] American female in NAD  SKIN: skin color, texture, turgor are normal, no rashes or significant lesions EYES: conjunctiva are pink and non-injected, sclera clear NECK: supple, non-tender LYMPH:  no palpable lymphadenopathy in the cervical, axillary or supraclavicular LUNGS: clear to auscultation and percussion with normal breathing effort HEART: regular rate & rhythm and no murmurs and no lower extremity edema ABDOMEN: soft, non-tender, non-distended, normal bowel sounds. No HSM appreciated.  Musculoskeletal: no cyanosis of digits and no clubbing  PSYCH: alert & oriented x 3, fluent speech NEURO: no focal motor/sensory deficits  LABORATORY DATA:  I have reviewed the data as listed CBC Latest Ref Rng & Units 10/20/2019 12/17/2013 08/02/2013  WBC 4.0 - 10.5 K/uL 3.7(L) 2.1(L) 2.0(L)  Hemoglobin 12.0 - 15.0 g/dL 14.4 12.5 13.4  Hematocrit 36 - 46 % 43.2 37.0 39.2  Platelets 150 - 400 K/uL 233 216 126(L)    CMP Latest Ref Rng & Units 10/20/2019 12/17/2013 08/02/2013  Glucose 70 - 99 mg/dL 98 95 99  BUN 6 - 20 mg/dL 7 12 7   Creatinine 0.44  - 1.00 mg/dL 0.77 0.76 0.63  Sodium 135 - 145 mmol/L 141 138 137  Potassium 3.5 - 5.1 mmol/L 3.9 4.1 4.7  Chloride 98 - 111 mmol/L 101 102 96  CO2 22 - 32 mmol/L 31 30 27   Calcium 8.9 - 10.3 mg/dL 9.5 8.9 9.0  Total Protein 6.5 - 8.1 g/dL 9.4(H) 7.2 -  Total Bilirubin 0.3 - 1.2 mg/dL 0.6 0.5 -  Alkaline Phos 38 - 126 U/L 78 54 -  AST 15 - 41 U/L 30 21 -  ALT 0 - 44 U/L 22 14 -   BLOOD FILM: Review of the peripheral blood smear showed normal appearing white cells (though decreased in number) with neutrophils that were appropriately lobated and granulated. There was no predominance of bi-lobed or hyper-segmented neutrophils appreciated. No Dohle bodies were noted. There was no left shifting, immature forms or blasts noted. Lymphocytes remain normal in size without any predominance of large granular lymphocytes. Red cells show no anisopoikilocytosis, macrocytes , microcytes or polychromasia. There were no schistocytes, target cells, echinocytes, acanthocytes, dacrocytes, or stomatocytes.There was no rouleaux formation, nucleated red cells, or intra-cellular inclusions noted. The platelets are normal in size, shape, and color without any clumping evident.  RADIOGRAPHIC STUDIES: No results found.  ASSESSMENT & PLAN Martha Schneider 54 y.o. female with medical history significant for HTN and sarcoidosis who presents for evaluation of acute on chronic sarcoidosis.  After review the labs, review the outside records, discussion with the patient her findings are most consistent with a benign neutropenia.  Her most recent dip to an [place] less than 1 is concerning and if this is maintained would require further evaluation with bone marrow biopsy.  If [place] is greater than 1 today we can continue with monitoring and work-up additional etiologies including viral etiologies, nutritional deficiencies, or splenic sequestration/liver disease.  An additional consideration would be benign ethnic neutropenia.  The patient's  history of sarcoidosis is also concerning along with her recent unexplained weight loss.  I would recommend we order a CT scan of the chest in order to assess for worsening sarcoidosis also serve as screening for lung cancer.  In the event we are not able to find any concerning etiologies I would recommend that we continue to monitor and have the patient return to clinic in 6 months time.  # Leukopenia, Neutropenia --today will order repeat CBC, CMP, LDH, and peripheral blood smear --CT abdomen to view liver/spleen to assess for sarcoidosis involvement of the spleen/ liver disease --nutritional evaluation with vitamin b12, folate, MMA, and homocysteine --viral workup with Hep B, Hep C, and HIV -- if no clear etiology can be discerned will need to consider bone marrow biopsy to further evaluate. It is possible there is Bone marrow involvement of her sarcoidosis --RTC in 6 months time or sooner if further evaluation is required.   #Pulmonary Sarcoidosis --patient has received no treatment since she moved to [facility]. She took steroids "years" ago --no currently having shortness of breath or cough -- will order CT chest to assess status o sarcoid and r/o lung malignancy given smoking history and recent history of weight loss.  --can refer to pulmonary if sarcoidosis appears active.   Orders Placed This Encounter  Procedures  . CT Chest W Contrast    Standing Status:   Future    Standing Expiration Date:   10/19/2020    Order Specific Question:   If indicated for the ordered procedure, I authorize the administration of contrast media per Radiology protocol    Answer:   Yes    Order Specific Question:   Is patient pregnant?    Answer:   No    Order Specific Question:   Preferred imaging location?    Answer:   [facility]    Order Specific Question:   Radiology Contrast Protocol - do NOT remove file path    Answer:   [email or web address]  . CBC with Differential  ([place] Only)    Standing Status:   Future    Number of Occurrences:   1    Standing Expiration Date:   10/19/2020  . Save Smear (SSMR)    Standing Status:   Future    Number of Occurrences:   1    Standing Expiration Date:   10/19/2020  . CMP ([place] only)    Standing Status:   Future    Number of Occurrences:   1    Standing Expiration Date:   10/19/2020  . Lactate dehydrogenase (LDH)    Standing Status:   Future    Number of Occurrences:   1    Standing Expiration Date:   10/19/2020  . Vitamin B12    Standing Status:   Future    Number of Occurrences:   1    Standing Expiration Date:   10/19/2020  . Folate, Serum    Standing Status:   Future    Number of Occurrences:   1    Standing Expiration Date:   10/19/2020  . Methylmalonic acid, serum    Standing Status:   Future    Number of Occurrences:   1    Standing Expiration Date:   10/19/2020  . Homocysteine, serum    Standing Status:   Future    Number of Occurrences:   1    Standing Expiration Date:   10/19/2020  . Hepatitis B  surface antigen    Standing Status:   Future    Number of Occurrences:   1    Standing Expiration Date:   10/19/2020  . Hepatitis C antibody    Standing Status:   Future    Number of Occurrences:   1    Standing Expiration Date:   10/19/2020  . HIV antibody (with reflex)    Standing Status:   Future    Number of Occurrences:   1    Standing Expiration Date:   10/19/2020  . Copper, serum    Standing Status:   Future    Number of Occurrences:   1    Standing Expiration Date:   10/19/2020  . Angiotensin converting enzyme    Standing Status:   Future    Number of Occurrences:   1    Standing Expiration Date:   10/19/2020    All questions were answered. The patient knows to call the clinic with any problems, questions or concerns.  A total of more than 60 minutes were spent on this encounter and over half of that time was spent on counseling and coordination of care as outlined above.    Ledell Peoples, MD Department of Hematology/Oncology [place] at [facility] Phone: [phone] Pager: [phone] Email: [email or web address]  10/21/2019 11:52 AM

## 2019-10-20 ENCOUNTER — Other Ambulatory Visit: Payer: Self-pay

## 2019-10-20 ENCOUNTER — Inpatient Hospital Stay: Payer: BC Managed Care – PPO | Attending: Adult Health | Admitting: Hematology and Oncology

## 2019-10-20 ENCOUNTER — Inpatient Hospital Stay: Payer: BC Managed Care – PPO

## 2019-10-20 VITALS — BP 145/95 | HR 82 | Temp 97.6°F | Resp 18 | Wt 120.4 lb

## 2019-10-20 DIAGNOSIS — R634 Abnormal weight loss: Secondary | ICD-10-CM | POA: Insufficient documentation

## 2019-10-20 DIAGNOSIS — D709 Neutropenia, unspecified: Secondary | ICD-10-CM | POA: Diagnosis not present

## 2019-10-20 DIAGNOSIS — I10 Essential (primary) hypertension: Secondary | ICD-10-CM | POA: Diagnosis not present

## 2019-10-20 DIAGNOSIS — R0602 Shortness of breath: Secondary | ICD-10-CM | POA: Insufficient documentation

## 2019-10-20 DIAGNOSIS — D869 Sarcoidosis, unspecified: Secondary | ICD-10-CM | POA: Diagnosis not present

## 2019-10-20 DIAGNOSIS — F17210 Nicotine dependence, cigarettes, uncomplicated: Secondary | ICD-10-CM | POA: Diagnosis not present

## 2019-10-20 DIAGNOSIS — D860 Sarcoidosis of lung: Secondary | ICD-10-CM

## 2019-10-20 DIAGNOSIS — Z79899 Other long term (current) drug therapy: Secondary | ICD-10-CM | POA: Diagnosis not present

## 2019-10-20 DIAGNOSIS — Z800 Family history of malignant neoplasm of digestive organs: Secondary | ICD-10-CM | POA: Insufficient documentation

## 2019-10-20 DIAGNOSIS — L659 Nonscarring hair loss, unspecified: Secondary | ICD-10-CM | POA: Diagnosis not present

## 2019-10-20 DIAGNOSIS — D708 Other neutropenia: Secondary | ICD-10-CM | POA: Diagnosis not present

## 2019-10-20 DIAGNOSIS — R490 Dysphonia: Secondary | ICD-10-CM | POA: Diagnosis not present

## 2019-10-20 DIAGNOSIS — J381 Polyp of vocal cord and larynx: Secondary | ICD-10-CM | POA: Diagnosis not present

## 2019-10-20 LAB — CBC WITH DIFFERENTIAL (CANCER CENTER ONLY)
Abs Immature Granulocytes: 0 10*3/uL (ref 0.00–0.07)
Basophils Absolute: 0 10*3/uL (ref 0.0–0.1)
Basophils Relative: 1 %
Eosinophils Absolute: 0 10*3/uL (ref 0.0–0.5)
Eosinophils Relative: 0 %
HCT: 43.2 % (ref 36.0–46.0)
Hemoglobin: 14.4 g/dL (ref 12.0–15.0)
Immature Granulocytes: 0 %
Lymphocytes Relative: 18 %
Lymphs Abs: 0.7 10*3/uL (ref 0.7–4.0)
MCH: 29.2 pg (ref 26.0–34.0)
MCHC: 33.3 g/dL (ref 30.0–36.0)
MCV: 87.6 fL (ref 80.0–100.0)
Monocytes Absolute: 0.4 10*3/uL (ref 0.1–1.0)
Monocytes Relative: 10 %
Neutro Abs: 2.6 10*3/uL (ref 1.7–7.7)
Neutrophils Relative %: 71 %
Platelet Count: 233 10*3/uL (ref 150–400)
RBC: 4.93 MIL/uL (ref 3.87–5.11)
RDW: 14.2 % (ref 11.5–15.5)
WBC Count: 3.7 10*3/uL — ABNORMAL LOW (ref 4.0–10.5)
nRBC: 0 % (ref 0.0–0.2)

## 2019-10-20 LAB — CMP (CANCER CENTER ONLY)
ALT: 22 U/L (ref 0–44)
AST: 30 U/L (ref 15–41)
Albumin: 4.7 g/dL (ref 3.5–5.0)
Alkaline Phosphatase: 78 U/L (ref 38–126)
Anion gap: 9 (ref 5–15)
BUN: 7 mg/dL (ref 6–20)
CO2: 31 mmol/L (ref 22–32)
Calcium: 9.5 mg/dL (ref 8.9–10.3)
Chloride: 101 mmol/L (ref 98–111)
Creatinine: 0.77 mg/dL (ref 0.44–1.00)
GFR, Est AFR Am: >60 mL/min (ref >60)
GFR, Estimated: >60 mL/min (ref >60)
Glucose, Bld: 98 mg/dL (ref 70–99)
Potassium: 3.9 mmol/L (ref 3.5–5.1)
Sodium: 141 mmol/L (ref 135–145)
Total Bilirubin: 0.6 mg/dL (ref 0.3–1.2)
Total Protein: 9.4 g/dL — ABNORMAL HIGH (ref 6.5–8.1)

## 2019-10-20 LAB — LACTATE DEHYDROGENASE: LDH: 235 U/L — ABNORMAL HIGH (ref 98–192)

## 2019-10-20 LAB — HIV ANTIBODY (ROUTINE TESTING W REFLEX): HIV Screen 4th Generation wRfx: NONREACTIVE

## 2019-10-20 LAB — FOLATE: Folate: 19.3 ng/mL (ref >5.9)

## 2019-10-20 LAB — HEPATITIS C ANTIBODY: HCV Ab: NONREACTIVE

## 2019-10-20 LAB — HEPATITIS B SURFACE ANTIGEN: Hepatitis B Surface Ag: NONREACTIVE

## 2019-10-20 LAB — SAVE SMEAR(SSMR), FOR PROVIDER SLIDE REVIEW

## 2019-10-20 LAB — VITAMIN B12: Vitamin B-12: 1267 pg/mL — ABNORMAL HIGH (ref 180–914)

## 2019-10-21 ENCOUNTER — Encounter: Payer: Self-pay | Admitting: Hematology and Oncology

## 2019-10-22 LAB — HOMOCYSTEINE: Homocysteine: 7.6 umol/L (ref 0.0–14.5)

## 2019-10-22 LAB — COPPER, SERUM: Copper: 107 ug/dL (ref 80–158)

## 2019-10-22 LAB — ANGIOTENSIN CONVERTING ENZYME: Angiotensin-Converting Enzyme: 71 U/L (ref 14–82)

## 2019-10-25 LAB — METHYLMALONIC ACID, SERUM: Methylmalonic Acid, Quantitative: 130 nmol/L (ref 0–378)

## 2019-10-29 ENCOUNTER — Ambulatory Visit (HOSPITAL_COMMUNITY): Payer: BC Managed Care – PPO

## 2019-10-29 ENCOUNTER — Telehealth: Payer: Self-pay | Admitting: *Deleted

## 2019-10-29 NOTE — Telephone Encounter (Signed)
TCT patient regarding recent lab results. Spoke with patient and advised that no clear cause for her low WBC was found.   Dr. Leonides Schanz did order a CT scan C/A/P to assess the status of her sarcoidosis. Pt is aware of this appt on 11/10/19. Will leave oral contrast for her to pick up at the front desk of the cancer center.  She voices understanding. No further questions or concerns

## 2019-10-29 NOTE — Telephone Encounter (Signed)
-----   Message from Jaci Standard, MD sent at 10/27/2019 11:32 AM EDT ----- Please let Martha Schneider know that her bloodwork did not show any clear cause of her low WBC count. A request has been sent for a CT C/A/P to assess the status of her sarcoidosis, but this has not yet been scheduled. Please see if you can get a date/time for this prior to the call for lab results.   [name]  ----- Message ----- From: Leory Plowman, Lab In [place] Sent: 10/20/2019  10:49 AM EDT To: Jaci Standard, MD

## 2019-11-10 ENCOUNTER — Encounter (HOSPITAL_COMMUNITY): Payer: Self-pay

## 2019-11-10 ENCOUNTER — Other Ambulatory Visit: Payer: Self-pay

## 2019-11-10 ENCOUNTER — Ambulatory Visit (HOSPITAL_COMMUNITY)
Admission: RE | Admit: 2019-11-10 | Discharge: 2019-11-10 | Disposition: A | Discharge status: Home / Self Care | Payer: BC Managed Care – PPO | Source: Ambulatory Visit | Attending: Hematology and Oncology | Admitting: Hematology and Oncology

## 2019-11-10 DIAGNOSIS — D72819 Decreased white blood cell count, unspecified: Secondary | ICD-10-CM | POA: Diagnosis not present

## 2019-11-10 DIAGNOSIS — J432 Centrilobular emphysema: Secondary | ICD-10-CM | POA: Diagnosis not present

## 2019-11-10 DIAGNOSIS — D869 Sarcoidosis, unspecified: Secondary | ICD-10-CM | POA: Diagnosis not present

## 2019-11-10 DIAGNOSIS — K8020 Calculus of gallbladder without cholecystitis without obstruction: Secondary | ICD-10-CM | POA: Diagnosis not present

## 2019-11-10 IMAGING — CT CT ABD-PELV W/ CM
2 of 5 series · 13 of 36 positions shown, 16 images · IV contrast (OMNIPAQUE)
Comparison: [DATE] abdominal sonogram.

CLINICAL DATA: Sarcoidosis. Leukopenia. Upper and lower back pain.
Dyspnea. Weight loss.

EXAM:
CT CHEST, ABDOMEN, AND PELVIS WITH CONTRAST
TECHNIQUE: Multidetector CT imaging of the chest, abdomen and pelvis was
performed following the standard protocol during bolus
administration of intravenous contrast.
CONTRAST:  100mL OMNIPAQUE IOHEXOL 300 MG/ML  SOLN

[Series 2: cap with · axial · 0.64mm/px · z∈[-561,-71]mm · 10 of 120 slices shown, 13 images]
[im 11/120  mediastinal]
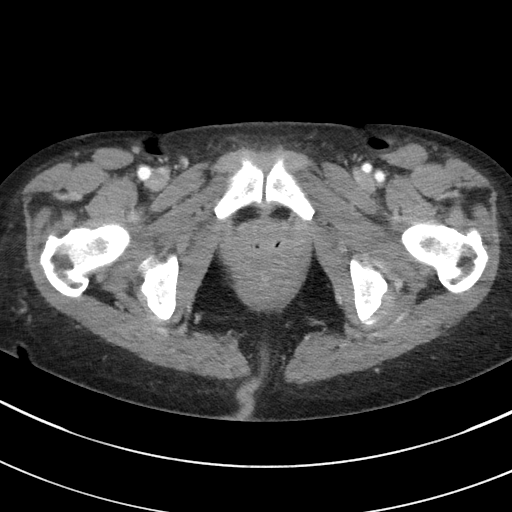
[im 11/120  lung]
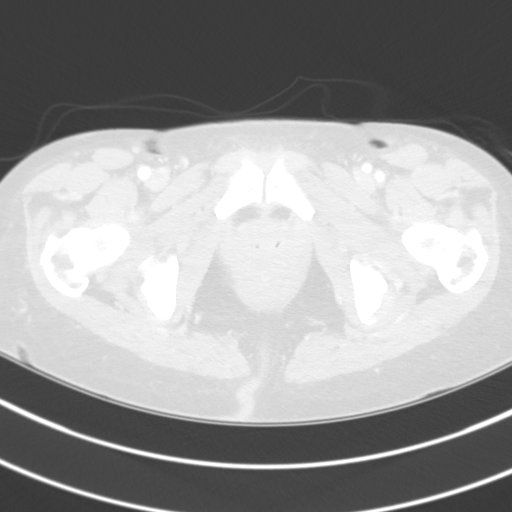
[im 22/120  lung]
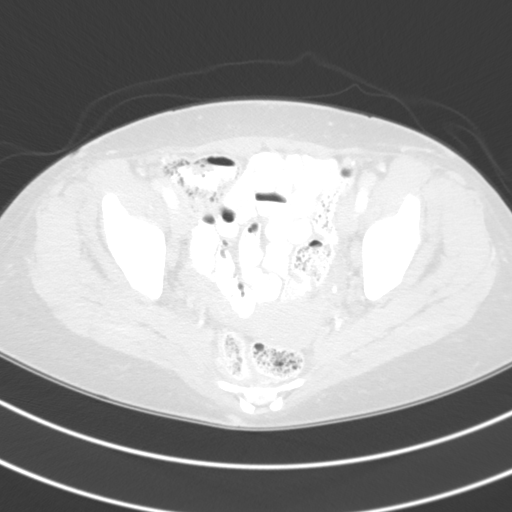
[im 33/120  lung]
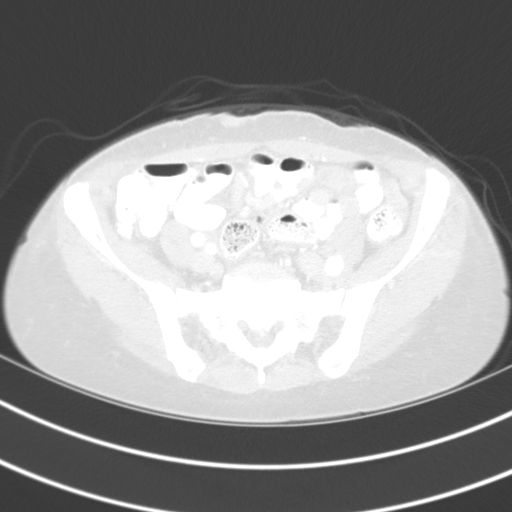
[im 44/120  lung]
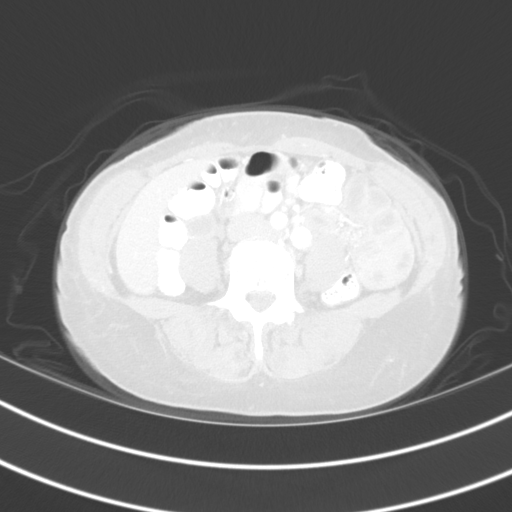
[im 55/120  mediastinal]
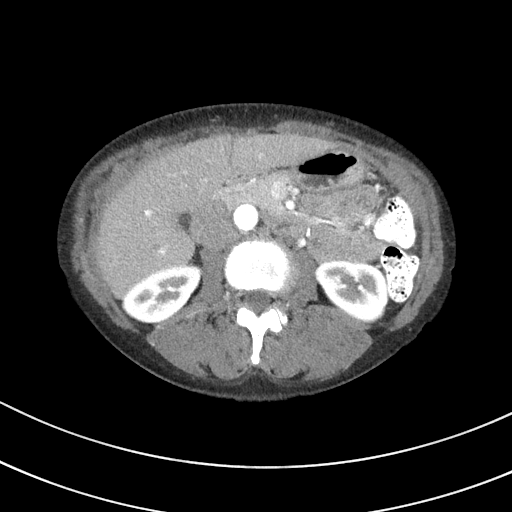
[im 55/120  lung]
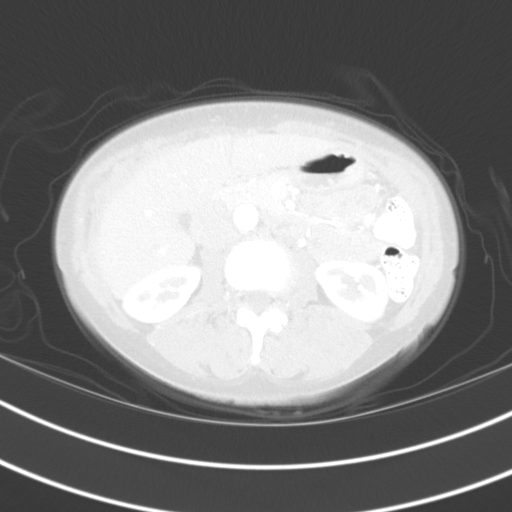
[im 65/120  lung]
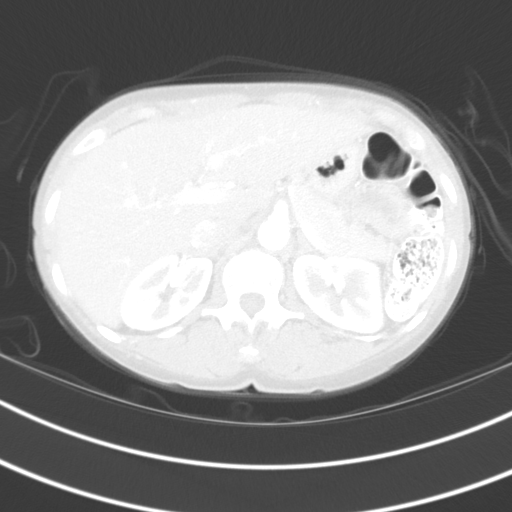
[im 76/120  lung]
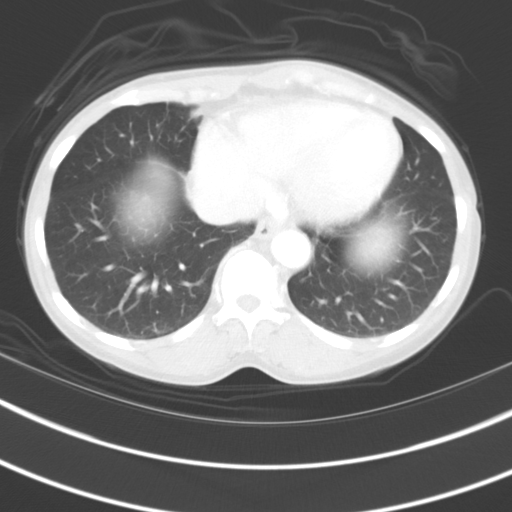
[im 87/120  lung]
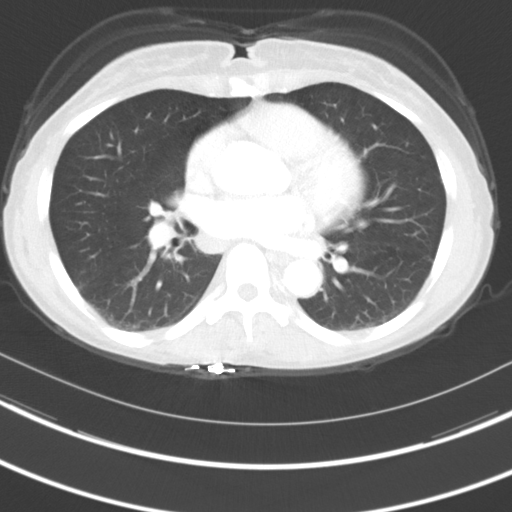
[im 98/120  mediastinal]
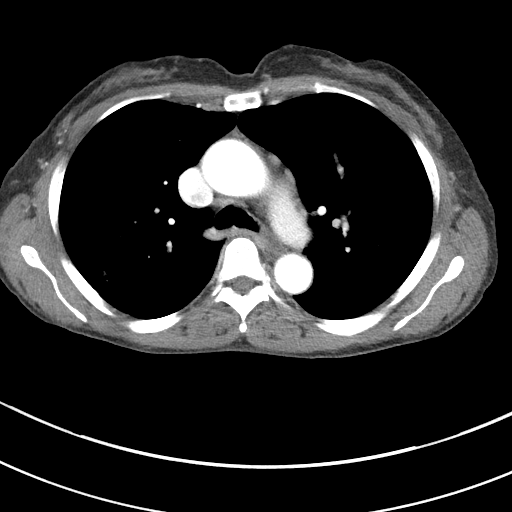
[im 98/120  lung]
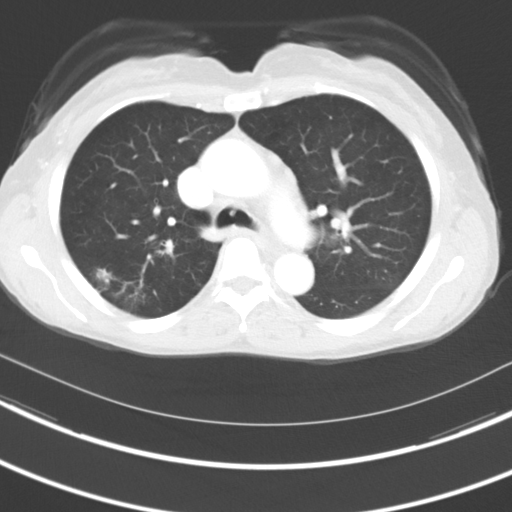
[im 109/120  lung]
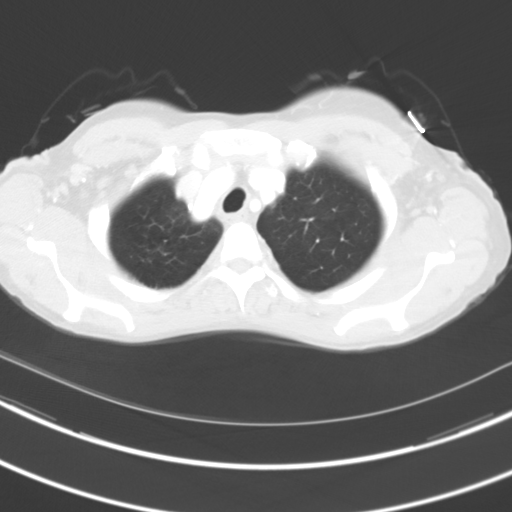

[Series 4: coronals · coronal · 0.79mm/px · 3 of 129 slices shown]
[im 26/129  lung]
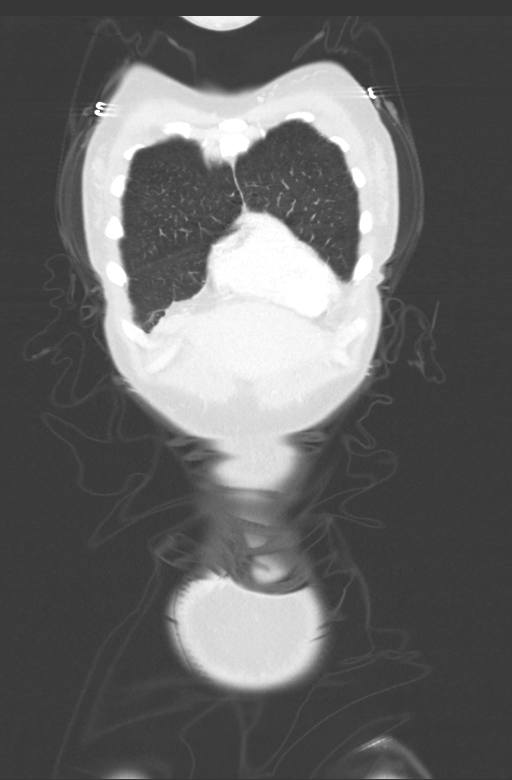
[im 52/129  lung]
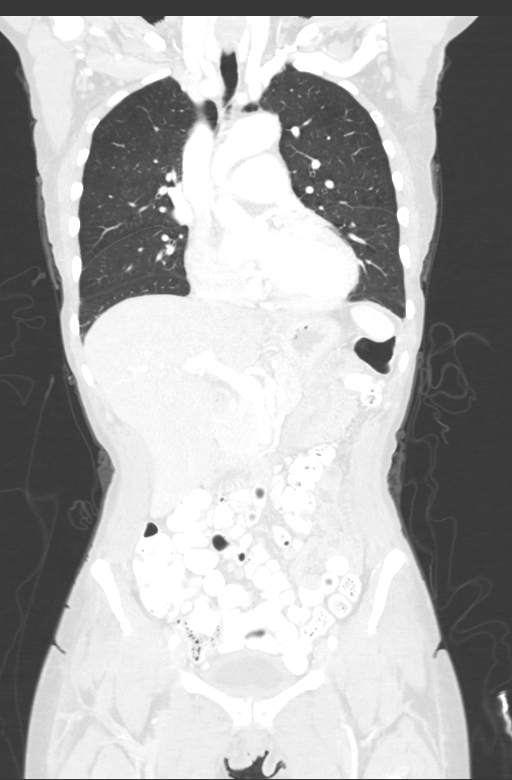
[im 77/129  lung]
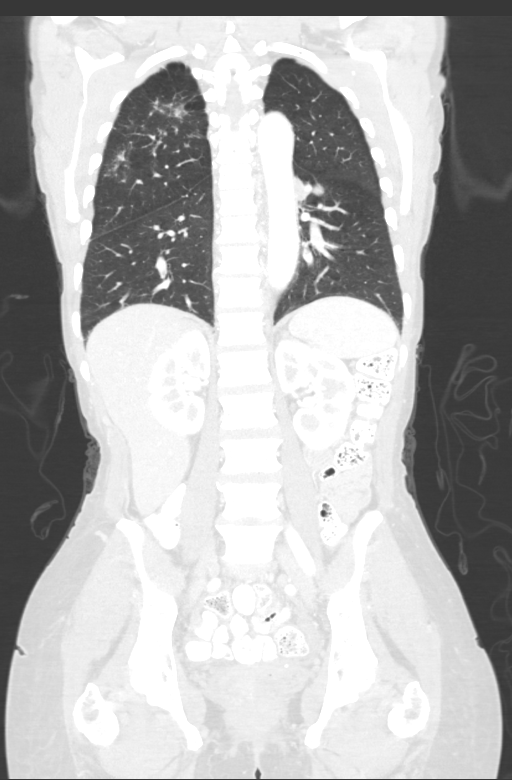

[13 of 36 positions shown; findings below may reference images not displayed]

FINDINGS: CT CHEST FINDINGS

Cardiovascular: Top-normal heart size. No significant pericardial
effusion/thickening. Minimally atherosclerotic nonaneurysmal
thoracic aorta. Dilated main pulmonary artery (3.7 cm diameter). No
central pulmonary emboli.

Mediastinum/Nodes: No discrete thyroid nodules. Unremarkable
esophagus. No pathologically enlarged axillary nodes. Mildly
enlarged 1.4 cm right subcarinal node (series 2/image 32). No
additional pathologically enlarged mediastinal nodes. No
pathologically enlarged hilar nodes.

Lungs/Pleura: No pneumothorax. No pleural effusion. Anterior left
lower lobe 7 mm solid pulmonary nodule along the major fissure
(series 6/image 80). No acute consolidative airspace disease, lung
masses or additional significant pulmonary nodules. Mild
centrilobular and paraseptal emphysema. Finely nodular patchy
interstitial thickening in the posterior right upper lobe with
associated mild parenchymal distortion (series 6/image 46).

Musculoskeletal: No aggressive appearing focal osseous lesions.
Minimal thoracic spondylosis.

CT ABDOMEN PELVIS FINDINGS

Hepatobiliary: Normal liver with no liver mass. Cholelithiasis. No
gallbladder wall thickening or pericholecystic fluid. No biliary
ductal dilatation.

Pancreas: Normal, with no mass or duct dilation.

Spleen: Normal size. No mass.

Adrenals/Urinary Tract: Normal adrenals. Normal kidneys with no
hydronephrosis and no renal mass. Questionable diffuse bladder wall
thickening versus under distention.

Stomach/Bowel: Normal non-distended stomach. Normal caliber small
bowel with no small bowel wall thickening. Normal appendix. Oral
contrast transits to the rectum. Normal large bowel with no
diverticulosis, large bowel wall thickening or pericolonic fat
stranding.

Vascular/Lymphatic: Mild atherosclerotic nonaneurysmal abdominal
aorta. Patent portal, splenic, hepatic and renal veins. No
pathologically enlarged lymph nodes in the abdomen or pelvis.

Reproductive: Grossly normal uterus.  No adnexal mass.

Other: No pneumoperitoneum, ascites or focal fluid collection.

Musculoskeletal: No aggressive appearing focal osseous lesions. Mild
lumbar spondylosis.
IMPRESSION: 1. Finely nodular patchy interstitial thickening in the posterior
right upper lobe with associated mild parenchymal distortion. Left
perifissural nodule. These findings are compatible with pulmonary
sarcoidosis. Consider follow-up high-resolution chest CT study in
6-12 months to assess temporal pattern stability, as clinically
warranted.
2. Mild right subcarinal lymphadenopathy, compatible with reported
sarcoidosis. No additional sites of lymphadenopathy in the chest,
abdomen or pelvis.
3. Dilated main pulmonary artery, suggesting pulmonary arterial
hypertension.
4. Cholelithiasis.
5. Questionable diffuse bladder wall thickening versus under
distention. Correlate with urinalysis as clinically warranted.
6. Aortic Atherosclerosis ([XH]-[XH]) and Emphysema ([XH]-[XH]).

## 2019-11-10 IMAGING — CT CT CHEST W/ CM
2 of 5 series · 13 of 36 positions shown, 16 images · IV contrast (OMNIPAQUE)
Comparison: [DATE] abdominal sonogram.

CLINICAL DATA: Sarcoidosis. Leukopenia. Upper and lower back pain.
Dyspnea. Weight loss.

EXAM:
CT CHEST, ABDOMEN, AND PELVIS WITH CONTRAST
TECHNIQUE: Multidetector CT imaging of the chest, abdomen and pelvis was
performed following the standard protocol during bolus
administration of intravenous contrast.
CONTRAST:  100mL OMNIPAQUE IOHEXOL 300 MG/ML  SOLN

[Series 2: cap with · axial · 0.64mm/px · z∈[-561,-71]mm · 10 of 120 slices shown, 13 images]
[im 11/120  mediastinal]
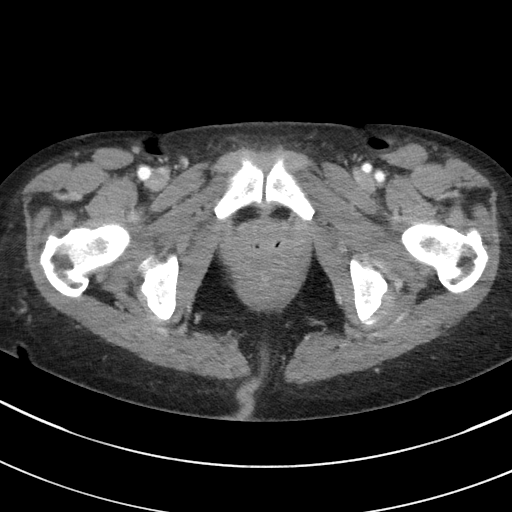
[im 11/120  lung]
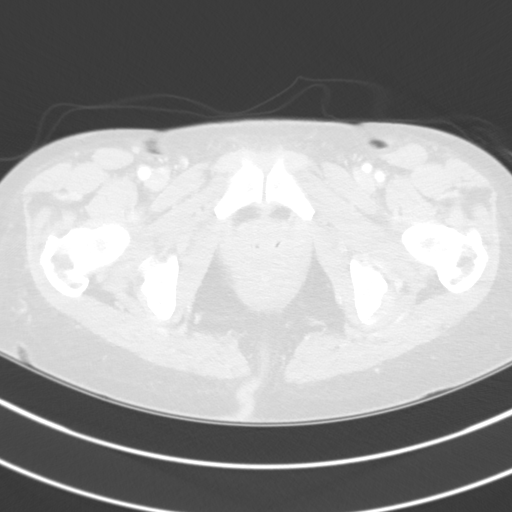
[im 22/120  lung]
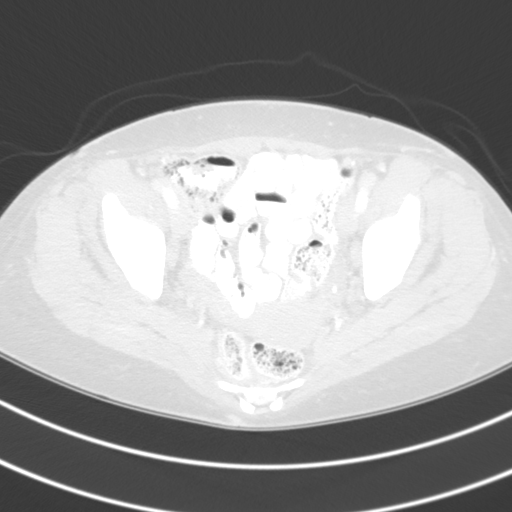
[im 33/120  lung]
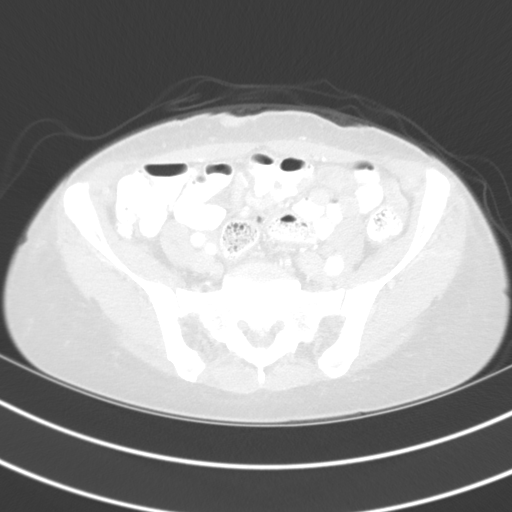
[im 44/120  lung]
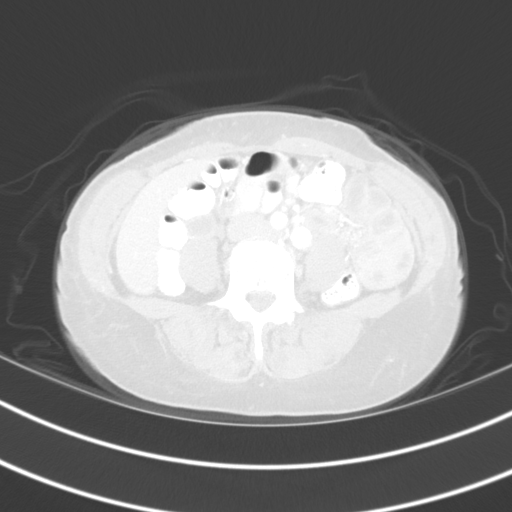
[im 55/120  mediastinal]
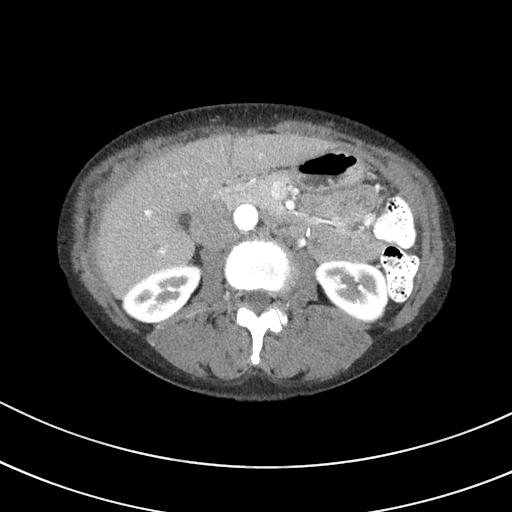
[im 55/120  lung]
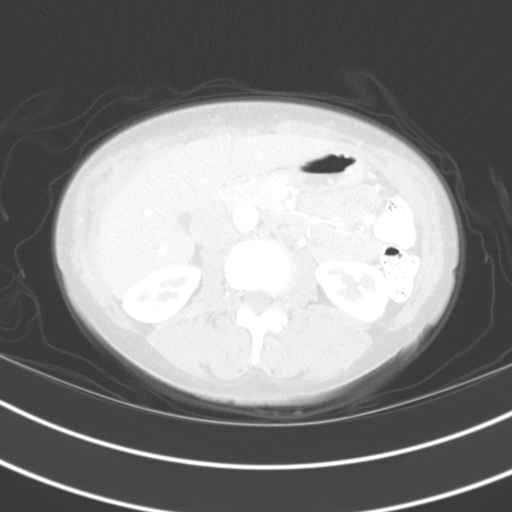
[im 65/120  lung]
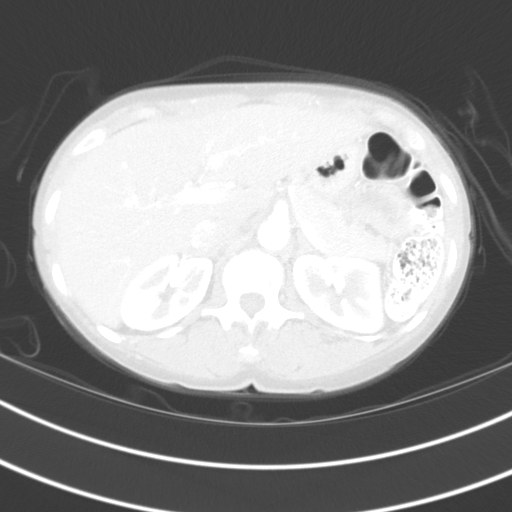
[im 76/120  lung]
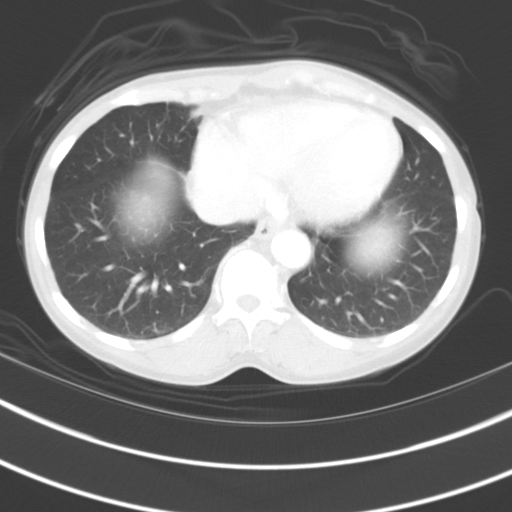
[im 87/120  lung]
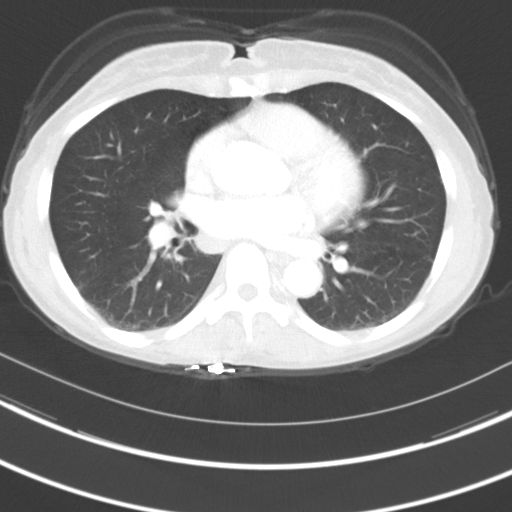
[im 98/120  mediastinal]
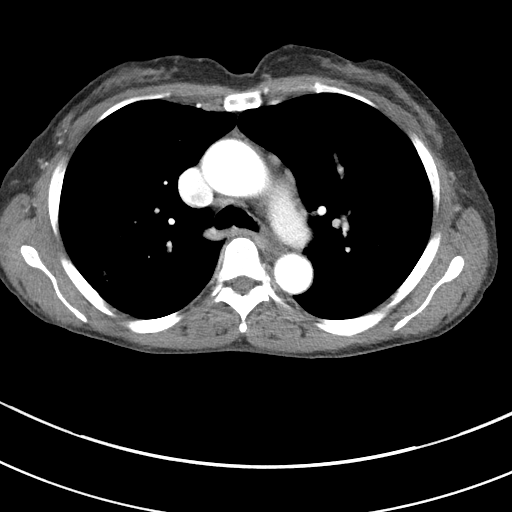
[im 98/120  lung]
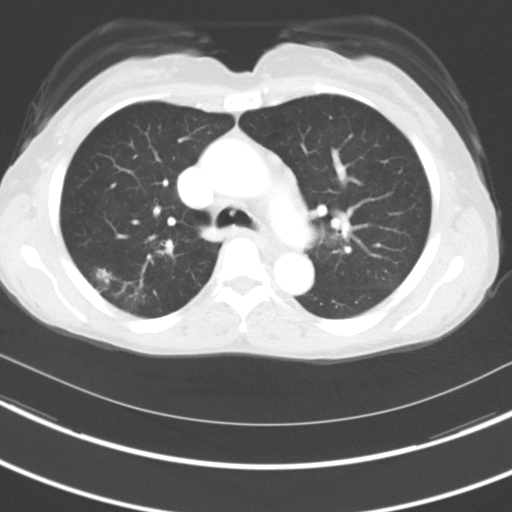
[im 109/120  lung]
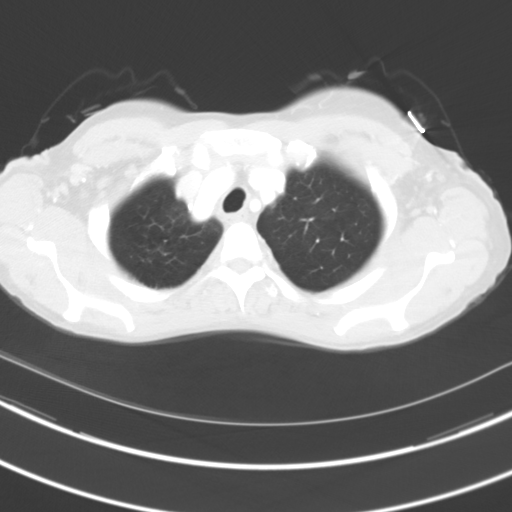

[Series 4: coronals · coronal · 0.79mm/px · 3 of 129 slices shown]
[im 26/129  lung]
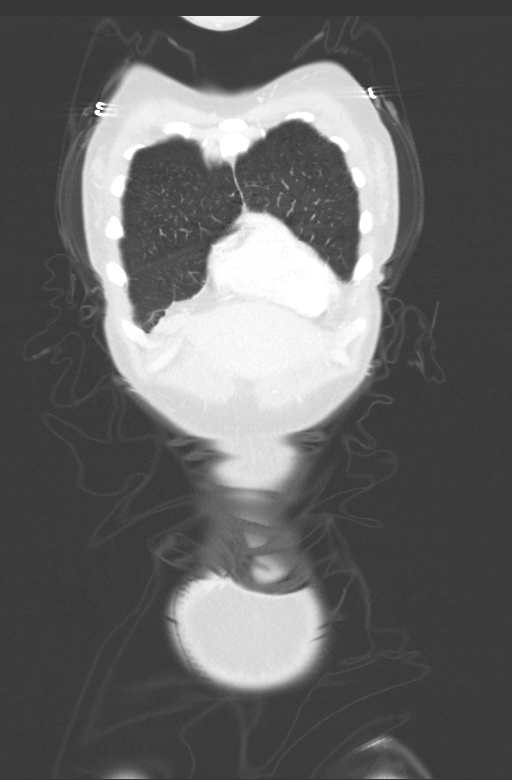
[im 52/129  lung]
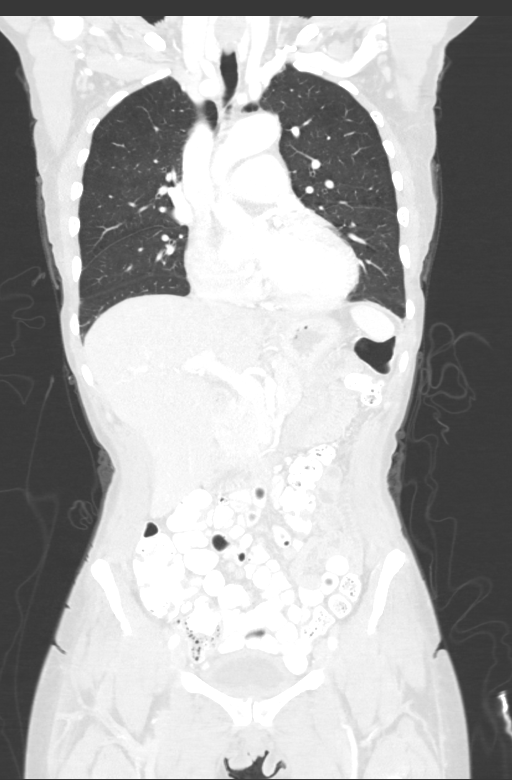
[im 77/129  lung]
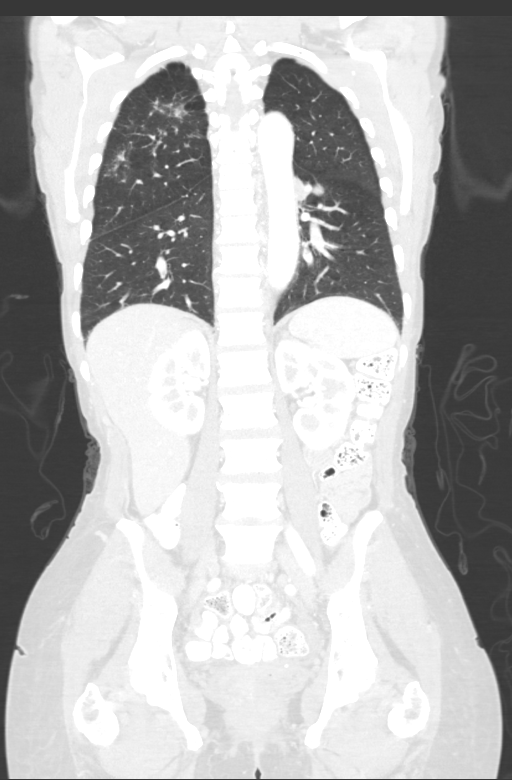

[13 of 36 positions shown; findings below may reference images not displayed]

FINDINGS: CT CHEST FINDINGS

Cardiovascular: Top-normal heart size. No significant pericardial
effusion/thickening. Minimally atherosclerotic nonaneurysmal
thoracic aorta. Dilated main pulmonary artery (3.7 cm diameter). No
central pulmonary emboli.

Mediastinum/Nodes: No discrete thyroid nodules. Unremarkable
esophagus. No pathologically enlarged axillary nodes. Mildly
enlarged 1.4 cm right subcarinal node (series 2/image 32). No
additional pathologically enlarged mediastinal nodes. No
pathologically enlarged hilar nodes.

Lungs/Pleura: No pneumothorax. No pleural effusion. Anterior left
lower lobe 7 mm solid pulmonary nodule along the major fissure
(series 6/image 80). No acute consolidative airspace disease, lung
masses or additional significant pulmonary nodules. Mild
centrilobular and paraseptal emphysema. Finely nodular patchy
interstitial thickening in the posterior right upper lobe with
associated mild parenchymal distortion (series 6/image 46).

Musculoskeletal: No aggressive appearing focal osseous lesions.
Minimal thoracic spondylosis.

CT ABDOMEN PELVIS FINDINGS

Hepatobiliary: Normal liver with no liver mass. Cholelithiasis. No
gallbladder wall thickening or pericholecystic fluid. No biliary
ductal dilatation.

Pancreas: Normal, with no mass or duct dilation.

Spleen: Normal size. No mass.

Adrenals/Urinary Tract: Normal adrenals. Normal kidneys with no
hydronephrosis and no renal mass. Questionable diffuse bladder wall
thickening versus under distention.

Stomach/Bowel: Normal non-distended stomach. Normal caliber small
bowel with no small bowel wall thickening. Normal appendix. Oral
contrast transits to the rectum. Normal large bowel with no
diverticulosis, large bowel wall thickening or pericolonic fat
stranding.

Vascular/Lymphatic: Mild atherosclerotic nonaneurysmal abdominal
aorta. Patent portal, splenic, hepatic and renal veins. No
pathologically enlarged lymph nodes in the abdomen or pelvis.

Reproductive: Grossly normal uterus.  No adnexal mass.

Other: No pneumoperitoneum, ascites or focal fluid collection.

Musculoskeletal: No aggressive appearing focal osseous lesions. Mild
lumbar spondylosis.
IMPRESSION: 1. Finely nodular patchy interstitial thickening in the posterior
right upper lobe with associated mild parenchymal distortion. Left
perifissural nodule. These findings are compatible with pulmonary
sarcoidosis. Consider follow-up high-resolution chest CT study in
6-12 months to assess temporal pattern stability, as clinically
warranted.
2. Mild right subcarinal lymphadenopathy, compatible with reported
sarcoidosis. No additional sites of lymphadenopathy in the chest,
abdomen or pelvis.
3. Dilated main pulmonary artery, suggesting pulmonary arterial
hypertension.
4. Cholelithiasis.
5. Questionable diffuse bladder wall thickening versus under
distention. Correlate with urinalysis as clinically warranted.
6. Aortic Atherosclerosis ([XH]-[XH]) and Emphysema ([XH]-[XH]).

## 2019-11-10 MED ORDER — IOHEXOL 300 MG/ML  SOLN
100.0000 mL | Freq: Once | INTRAMUSCULAR | Status: AC | PRN
  Administered 2019-11-10: 100 mL via INTRAVENOUS

## 2019-11-10 MED ORDER — SODIUM CHLORIDE (PF) 0.9 % IJ SOLN
INTRAMUSCULAR | Status: AC
  Filled 2019-11-10: qty 50

## 2019-11-15 ENCOUNTER — Telehealth: Payer: Self-pay | Admitting: Hematology and Oncology

## 2019-11-15 DIAGNOSIS — D860 Sarcoidosis of lung: Secondary | ICD-10-CM

## 2019-11-15 NOTE — Telephone Encounter (Signed)
Called Ms. Smedberg to discuss the results of her CT scan of the chest.  Findings show enlarged lymph nodes consistent with her prior diagnosis of sarcoidosis.  Additionally radiology noted that they were findings concerning for pulmonary hypertension.  Given his constellation of symptoms I would recommend we refer the patient to pulmonary for further evaluation and management.  In regards to the patient's leukopenia I think this may represent either a benign ethnic neutropenia versus a side effect of the patient's sarcoidosis.  Full nutritional evaluation was otherwise negative and her other blood counts are perfectly within normal limits.  Therefore I do not think there is further evaluation required at this time, though we are happy to see the patient back in about 6 months time in order to assure stability and no downward trend.  Ulysees Barns, MD Department of Hematology/Oncology [facility] at [facility] Phone: [phone] Pager: [phone] Email: [email or web address]

## 2019-11-19 ENCOUNTER — Telehealth: Payer: Self-pay | Admitting: *Deleted

## 2019-11-19 ENCOUNTER — Telehealth: Payer: Self-pay | Admitting: Hematology and Oncology

## 2019-11-19 NOTE — Telephone Encounter (Signed)
Rescheduled appointments per 7/12 provider message. Patient is aware of updated appointment date and time.

## 2019-11-19 NOTE — Telephone Encounter (Signed)
TCT patient regarding appt with [facility].  Advised that her appt is 01/12/20 2 3:30 pm with Dr. Delton Coombes. Provided address and phone # in case she needs to change the appt.  Pt voiced understanding and was able to repeat information back to me.

## 2020-01-12 ENCOUNTER — Ambulatory Visit (INDEPENDENT_AMBULATORY_CARE_PROVIDER_SITE_OTHER): Payer: BC Managed Care – PPO

## 2020-01-12 ENCOUNTER — Other Ambulatory Visit: Payer: Self-pay

## 2020-01-12 ENCOUNTER — Ambulatory Visit: Payer: BC Managed Care – PPO | Admitting: Emergency Medicine

## 2020-01-12 ENCOUNTER — Encounter: Payer: Self-pay | Admitting: Emergency Medicine

## 2020-01-12 VITALS — BP 130/82 | HR 84 | Ht 63.0 in | Wt 120.0 lb

## 2020-01-12 DIAGNOSIS — I272 Pulmonary hypertension, unspecified: Secondary | ICD-10-CM | POA: Diagnosis not present

## 2020-01-12 DIAGNOSIS — D869 Sarcoidosis, unspecified: Secondary | ICD-10-CM | POA: Diagnosis not present

## 2020-01-12 DIAGNOSIS — R0600 Dyspnea, unspecified: Secondary | ICD-10-CM | POA: Diagnosis not present

## 2020-01-12 DIAGNOSIS — R0609 Other forms of dyspnea: Secondary | ICD-10-CM

## 2020-01-12 IMAGING — DX DG CHEST 2V
2 series · 2 of 2 positions shown · non-contrast
Comparison: [DATE] [DATE], [DATE], [DATE] [DATE], [DATE]

CLINICAL DATA: Sarcoidosis

EXAM:
CHEST - 2 VIEW

[chest pa]
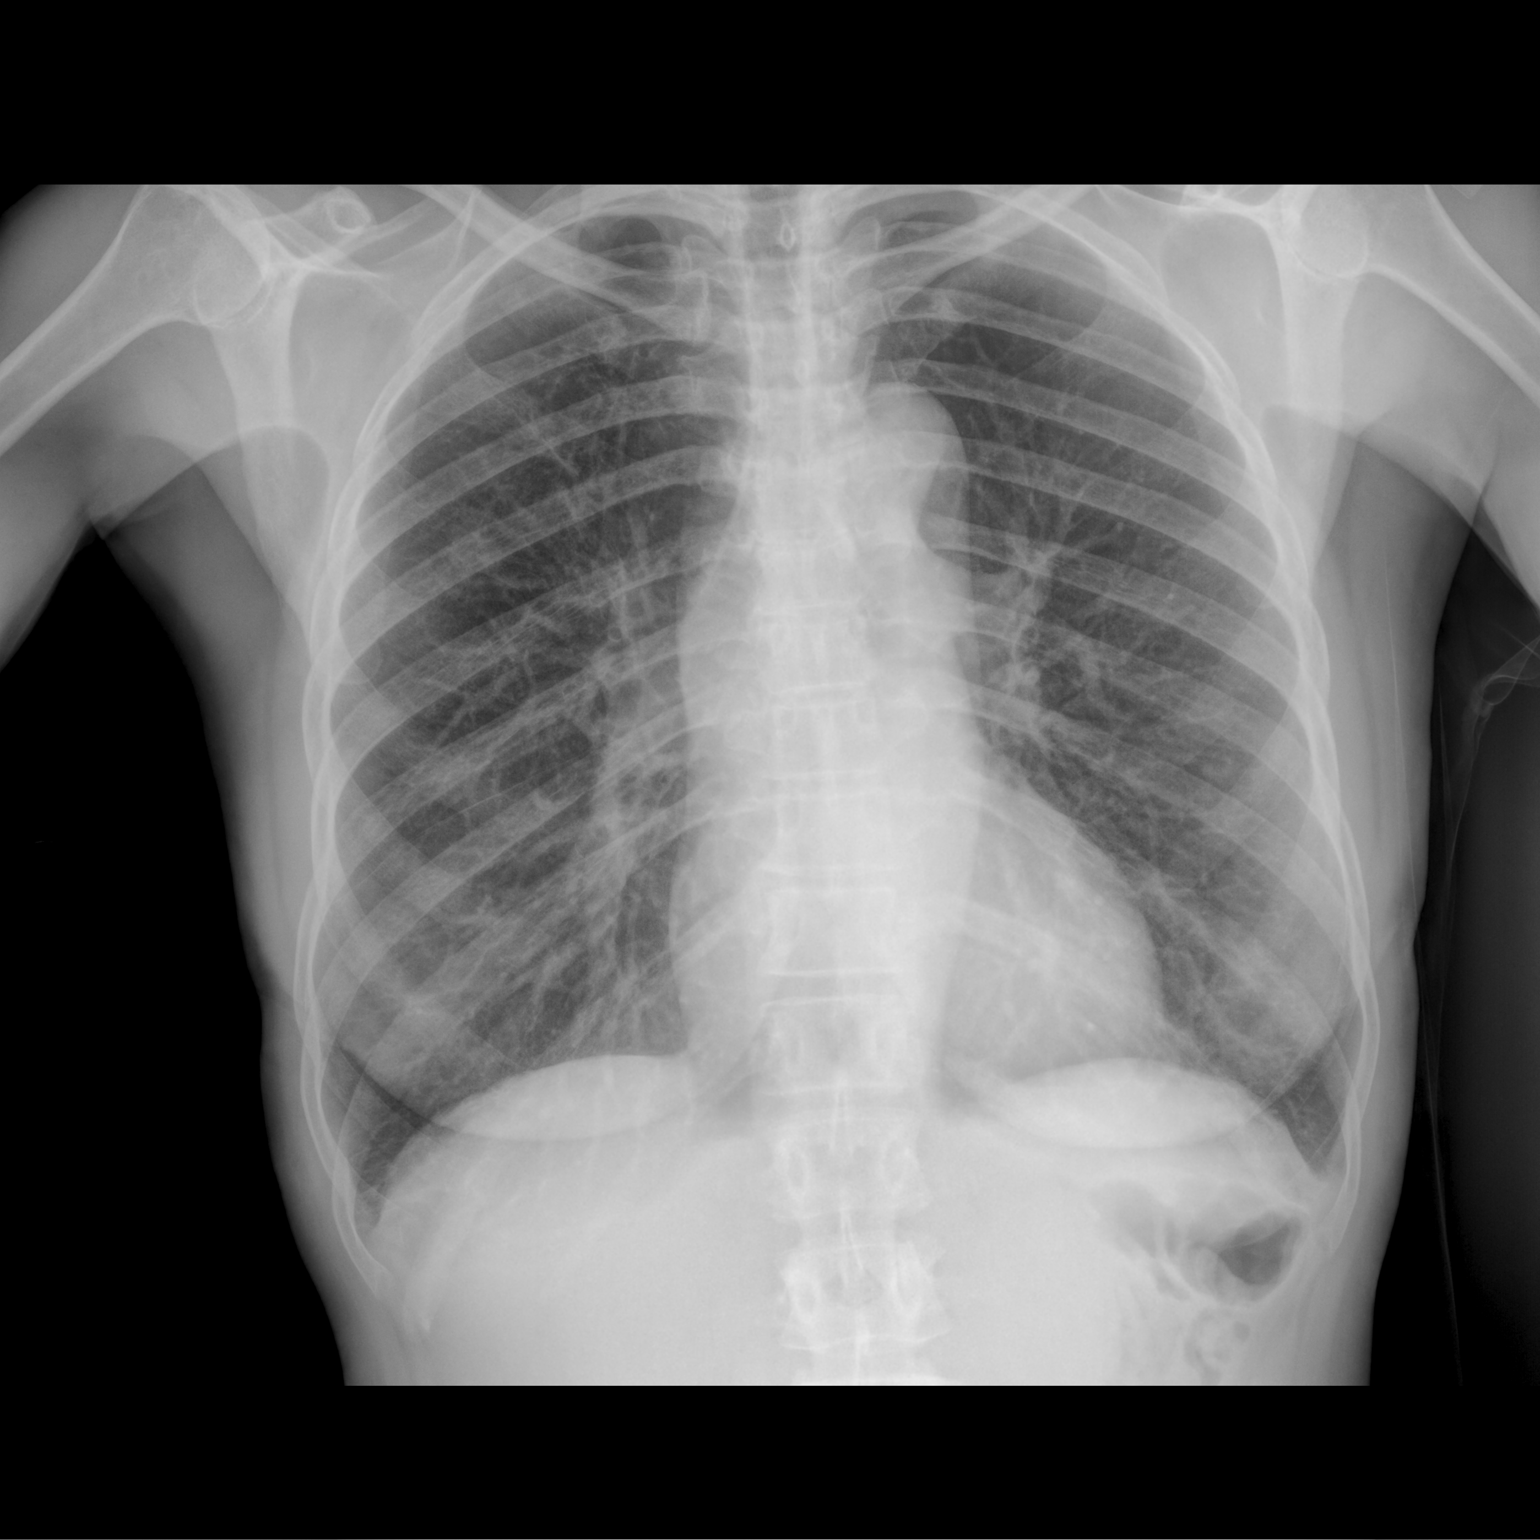

[chest lat]
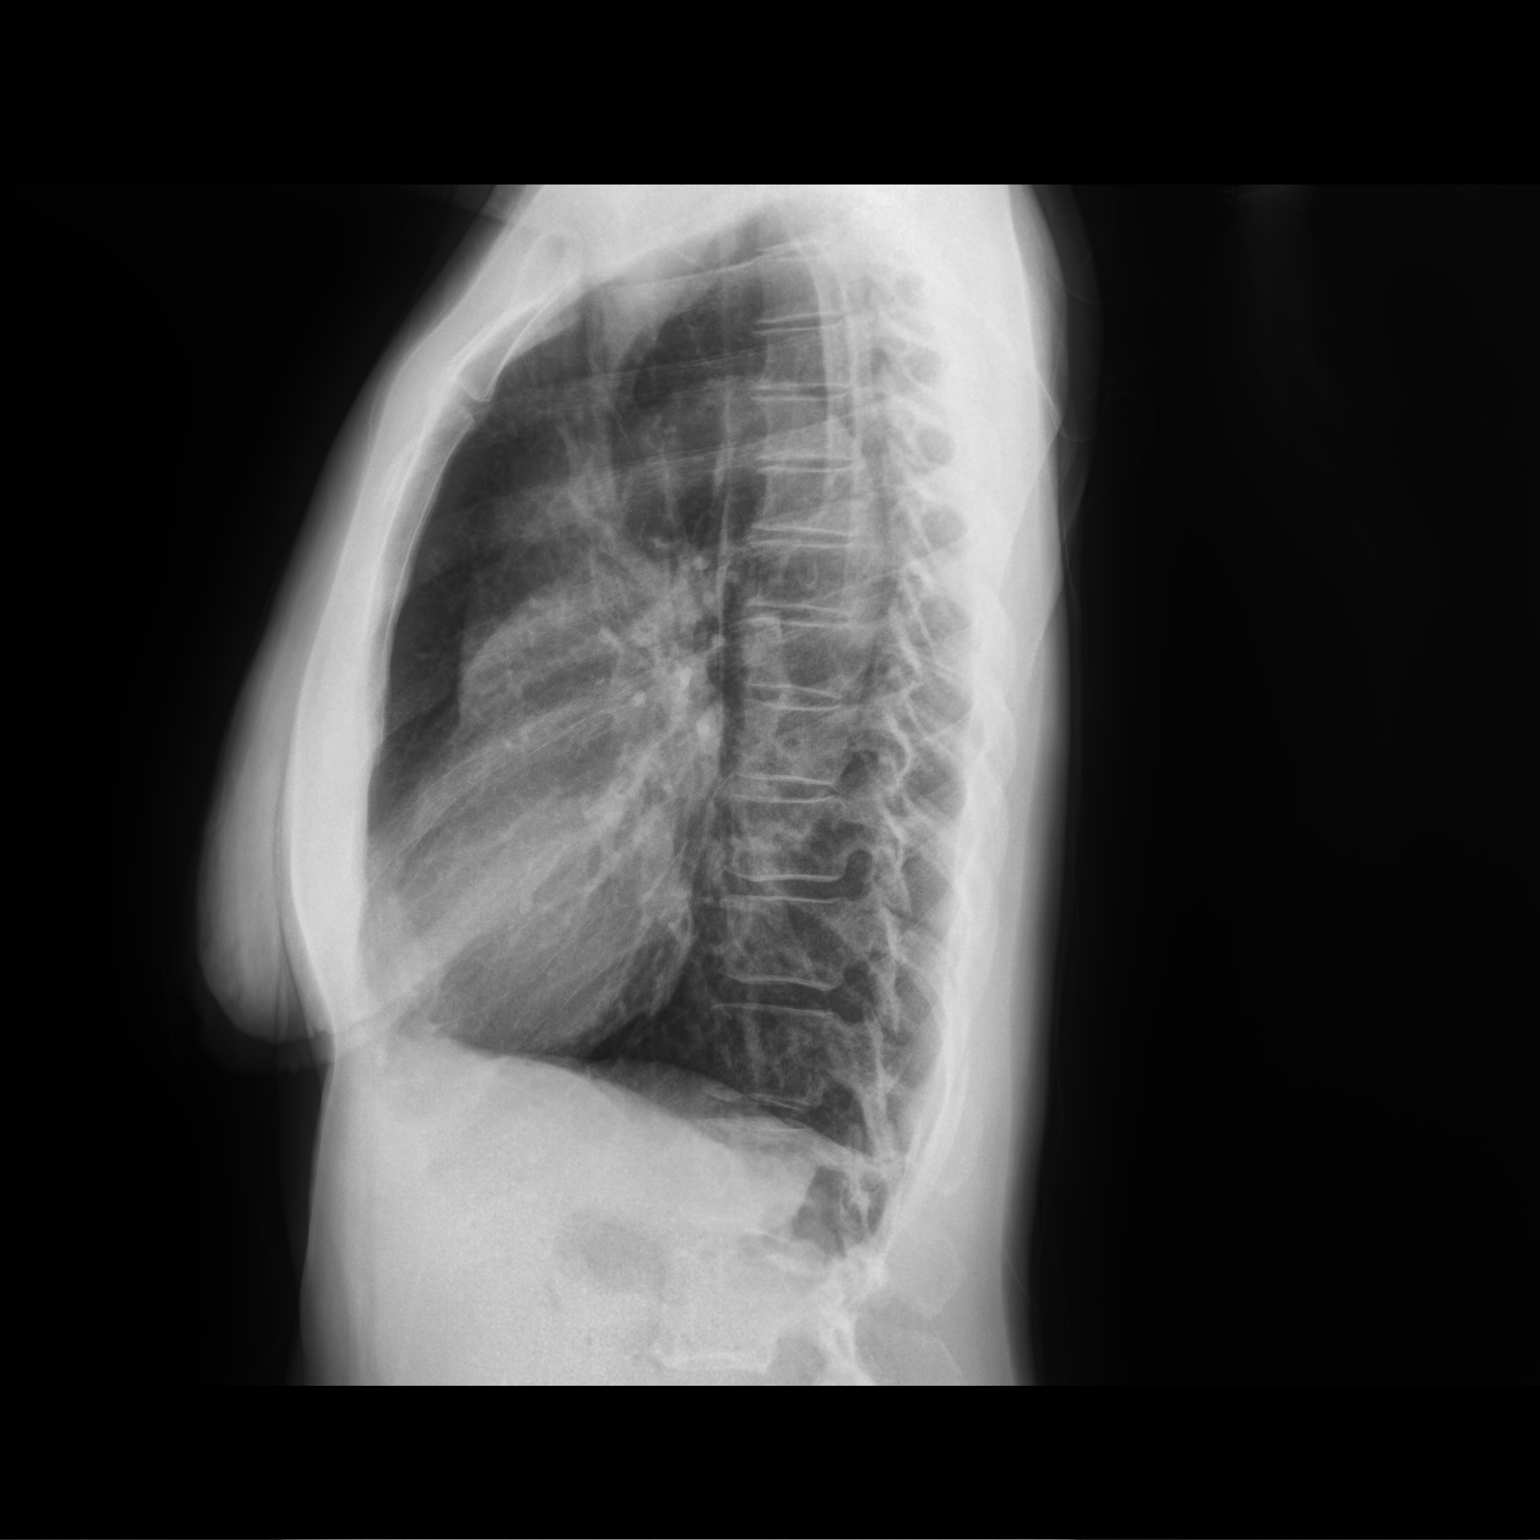

[2 of 2 positions shown; findings below may reference images not displayed]

FINDINGS: The cardiomediastinal silhouette is unchanged in contour with a
prominent RIGHT paramediastinal border. No pleural effusion. No
pneumothorax. Unchanged irregular nodular opacities of the RIGHT
upper lobe. No acute pleuroparenchymal abnormality. Visualized
abdomen is unremarkable. No acute osseous abnormality.
IMPRESSION: 1. No acute cardiopulmonary abnormality. Unchanged sequela of
sarcoidosis in comparison to recent CT.

## 2020-01-12 MED ORDER — PREDNISONE 20 MG PO TABS: 20.0000 mg | ORAL_TABLET | Freq: Every day | ORAL | 0 refills | Status: DC

## 2020-01-12 NOTE — Progress Notes (Signed)
Subjective:    Patient ID: Martha Schneider, female    DOB: [birth year], 54 y.o.   MRN: [id number]  HPI 54 year old smoker (20 pack years) with a history of hypertension, leukopenia, sarcoidosis that was diagnosed ??  bronchoscopy in [place].  She has been treated with maintenance prednisone in the past, not currently.  She is referred today for dyspnea. She began to notice some slowly progressive exertional SOB. Notices it with walking, with bending over. She does not wheeze, no CP. She does have L back / flank pain. She has albuterol that she uses once in the am. She then uses a Vicks inhaler prn through the day. She has a hoarse voice - has VC polyps, seen by Dr Pollyann Kennedy.   She underwent a CT scan of the abdomen pelvis 11/10/2019 which I have reviewed, shows a mildly enlarged subcarinal node 1.4 cm without any other mediastinal or hilar enlargement.  There is a 7 mm solid left lower lobe nodule at the major fissure without any consolidation, mass, overt infiltrate.  There are some fine nodular patchy interstitial thickening in the posterior right upper lobe.  She does have some centrilobular and paraseptal emphysematous change.  Pulmonary hypertension suggested by dilated main pulmonary artery.   Review of Systems As per hpi  Past Medical History:  Diagnosis Date   Hypertension    Sarcoidosis      Family History  Problem Relation Age of Onset   Heart attack Mother    Liver cancer Father    Diabetes Mellitus II Sister    Kidney disease Sister      Social History   Socioeconomic History   Marital status: Married    Spouse name: Not on file   Number of children: 2   Years of education: Not on file   Highest education level: Not on file  Occupational History   Not on file  Tobacco Use   Smoking status: Current Every Day Smoker    Packs/day: 0.50    Years: 37.00    Pack years: 18.50   Smokeless tobacco: Never Used  Building services engineer Use: Never used  Substance and  Sexual Activity   Alcohol use: Yes    Comment: occasionally   Drug use: No   Sexual activity: Not on file  Other Topics Concern   Not on file  Social History Narrative   Not on file   Social Determinants of Health   Financial Resource Strain:    Difficulty of Paying Living Expenses: Not on file  Food Insecurity:    Worried About Running Out of Food in the Last Year: Not on file   Ran Out of Food in the Last Year: Not on file  Transportation Needs:    Lack of Transportation (Medical): Not on file   Lack of Transportation (Non-Medical): Not on file  Physical Activity:    Days of Exercise per Week: Not on file   Minutes of Exercise per Session: Not on file  Stress:    Feeling of Stress : Not on file  Social Connections:    Frequency of Communication with Friends and Family: Not on file   Frequency of Social Gatherings with Friends and Family: Not on file   Attends Religious Services: Not on file   Active Member of Clubs or Organizations: Not on file   Attends Banker Meetings: Not on file   Marital Status: Not on file  Intimate Partner Violence:    Fear of  Current or Ex-Partner: Not on file   Emotionally Abused: Not on file   Physically Abused: Not on file   Sexually Abused: Not on file    She is from [place] Has worked as a a Lawyer  No other exposures.   No Known Allergies   Outpatient Medications Prior to Visit  Medication Sig Dispense Refill   amLODipine (NORVASC) 5 MG tablet Take 5 mg by mouth daily.     aspirin 325 MG tablet Take 325 mg by mouth daily.     dextromethorphan-guaiFENesin (MUCINEX DM) 30-600 MG per 12 hr tablet Take 1 tablet by mouth 2 (two) times daily as needed for cough.     sertraline (ZOLOFT) 100 MG tablet Take 100 mg by mouth daily.     amLODipine (NORVASC) 10 MG tablet Take 1 tablet (10 mg total) by mouth daily. 30 tablet 0   hydrochlorothiazide (HYDRODIURIL) 25 MG tablet Take 1 tablet (25 mg total) by mouth  daily. 30 tablet 0   Multiple Vitamins-Minerals (MULTIVITAMIN WITH MINERALS) tablet Take 1 tablet by mouth daily.     Omega-3 Fatty Acids (FISH OIL) 1000 MG CAPS Take by mouth.     No facility-administered medications prior to visit.        Objective:   Physical Exam  Vitals:   01/12/20 1520  BP: 130/82  Pulse: 84  SpO2: 98%  Weight: 120 lb (54.4 kg)  Height: 5\' 3"  (1.6 m)   Gen: Pleasant, well-nourished, in no distress,  normal affect  ENT: No lesions,  mouth clear,  oropharynx clear, no postnasal drip  Neck: No JVD, no stridor  Lungs: No use of accessory muscles, no crackles or wheezing on normal respiration, no wheeze on forced expiration  Cardiovascular: RRR, heart sounds normal, no murmur or gallops, no peripheral edema  Musculoskeletal: No deformities, no cyanosis or clubbing  Neuro: alert, awake, non focal  Skin: Warm, no lesions or rash     Assessment & Plan:  Dyspnea on exertion Broad differential for her dyspnea. Consider active sarcoid, progressive obstructive lung disease. Consider also the impact of her vocal cord polyps which were identified by ENT, could have a ball-valve phenomenon contributing to upper airway obstruction. Finally consider pulmonary hypertension associated with sarcoidosis especially given the dilated main pulmonary artery seen on her CT chest. Her CT chest from 11/10/2019 shows patchy right upper lobe infiltrate, question active sarcoid. We will treat her for an acute flare with scheduled prednisone for 3 weeks and then stop. Work-up other causes of dyspnea as below  We will arrange for pulmonary function testing We will arrange for an echocardiogram Chest x-ray today Please take prednisone 20 mg daily for the next 3 weeks and then stop. Keep albuterol available to use 2 puffs up to every 4 hours if needed for shortness of breath, chest tightness, wheezing.  We will try to get records from your previous pulmonary group and [place] 11/12/2019  including office notes, breathing tests and imaging reports. Follow with Dr. [place] next available with full pulmonary function testing on the same day.   Delton Coombes, MD, PhD 01/13/2020, 5:52 PM [facility] [phone] or if no answer [phone]

## 2020-01-12 NOTE — Patient Instructions (Addendum)
We will arrange for pulmonary function testing We will arrange for an echocardiogram Chest x-ray today Please take prednisone 20 mg daily for the next 3 weeks and then stop. Keep albuterol available to use 2 puffs up to every 4 hours if needed for shortness of breath, chest tightness, wheezing.  We will try to get records from your previous pulmonary group and [place] including office notes, breathing tests and imaging reports. Follow with Dr. Delton Coombes next available with full pulmonary function testing on the same day.

## 2020-01-13 ENCOUNTER — Encounter: Payer: Self-pay | Admitting: Emergency Medicine

## 2020-01-13 DIAGNOSIS — R0609 Other forms of dyspnea: Secondary | ICD-10-CM | POA: Insufficient documentation

## 2020-01-13 DIAGNOSIS — R0600 Dyspnea, unspecified: Secondary | ICD-10-CM | POA: Insufficient documentation

## 2020-01-13 NOTE — Assessment & Plan Note (Signed)
Broad differential for her dyspnea. Consider active sarcoid, progressive obstructive lung disease. Consider also the impact of her vocal cord polyps which were identified by ENT, could have a ball-valve phenomenon contributing to upper airway obstruction. Finally consider pulmonary hypertension associated with sarcoidosis especially given the dilated main pulmonary artery seen on her CT chest. Her CT chest from 11/10/2019 shows patchy right upper lobe infiltrate, question active sarcoid. We will treat her for an acute flare with scheduled prednisone for 3 weeks and then stop. Work-up other causes of dyspnea as below  We will arrange for pulmonary function testing We will arrange for an echocardiogram Chest x-ray today Please take prednisone 20 mg daily for the next 3 weeks and then stop. Keep albuterol available to use 2 puffs up to every 4 hours if needed for shortness of breath, chest tightness, wheezing.  We will try to get records from your previous pulmonary group and [place] including office notes, breathing tests and imaging reports. Follow with Dr. Delton Coombes next available with full pulmonary function testing on the same day.

## 2020-01-26 ENCOUNTER — Other Ambulatory Visit: Payer: Self-pay | Admitting: Emergency Medicine

## 2020-01-27 ENCOUNTER — Encounter (HOSPITAL_COMMUNITY): Payer: Self-pay | Admitting: Emergency Medicine

## 2020-02-07 ENCOUNTER — Telehealth (HOSPITAL_COMMUNITY): Payer: Self-pay | Admitting: Emergency Medicine

## 2020-02-07 NOTE — Telephone Encounter (Signed)
Just an FYI. We have made several attempts to contact this patient including sending a letter to schedule or reschedule their echocardiogram. We will be removing the patient from the echo WQ.   01/27/2020 MAILED LETTER [facility]  01/24/2020 [facility] to schedule @ 9:34/[facility] 01/17/20 [facility] to schedule @ 11:47/[facility] 01/13/20 [facility] to schedule @ 11:34/[facility] no pa req for all 3 cpt codes/lo/9/25-Gave to [facility] to precert///hdp      Thank you

## 2020-02-16 ENCOUNTER — Other Ambulatory Visit: Payer: Self-pay

## 2020-02-16 ENCOUNTER — Ambulatory Visit: Payer: BC Managed Care – PPO | Admitting: Emergency Medicine

## 2020-04-12 ENCOUNTER — Ambulatory Visit: Payer: BC Managed Care – PPO | Admitting: Emergency Medicine

## 2020-04-24 ENCOUNTER — Ambulatory Visit: Payer: BC Managed Care – PPO | Admitting: Hematology and Oncology

## 2020-04-24 ENCOUNTER — Other Ambulatory Visit: Payer: BC Managed Care – PPO

## 2020-05-03 ENCOUNTER — Other Ambulatory Visit: Payer: Self-pay | Admitting: Hematology and Oncology

## 2020-05-03 ENCOUNTER — Inpatient Hospital Stay: Payer: BC Managed Care – PPO

## 2020-05-03 ENCOUNTER — Inpatient Hospital Stay: Payer: BC Managed Care – PPO | Admitting: Hematology and Oncology

## 2020-05-03 DIAGNOSIS — D708 Other neutropenia: Secondary | ICD-10-CM

## 2020-05-03 DIAGNOSIS — D860 Sarcoidosis of lung: Secondary | ICD-10-CM

## 2020-05-10 ENCOUNTER — Encounter: Payer: Self-pay | Admitting: Hematology and Oncology

## 2020-05-10 ENCOUNTER — Inpatient Hospital Stay: Payer: BC Managed Care – PPO | Attending: Hematology and Oncology

## 2020-05-10 ENCOUNTER — Other Ambulatory Visit: Payer: Self-pay

## 2020-05-10 ENCOUNTER — Inpatient Hospital Stay (HOSPITAL_BASED_OUTPATIENT_CLINIC_OR_DEPARTMENT_OTHER): Payer: BC Managed Care – PPO | Admitting: Hematology and Oncology

## 2020-05-10 VITALS — BP 178/95 | HR 80 | Temp 97.6°F | Resp 20 | Wt 117.7 lb

## 2020-05-10 DIAGNOSIS — Z79899 Other long term (current) drug therapy: Secondary | ICD-10-CM | POA: Insufficient documentation

## 2020-05-10 DIAGNOSIS — D708 Other neutropenia: Secondary | ICD-10-CM

## 2020-05-10 DIAGNOSIS — D860 Sarcoidosis of lung: Secondary | ICD-10-CM | POA: Diagnosis not present

## 2020-05-10 DIAGNOSIS — D72819 Decreased white blood cell count, unspecified: Secondary | ICD-10-CM | POA: Insufficient documentation

## 2020-05-10 DIAGNOSIS — F17200 Nicotine dependence, unspecified, uncomplicated: Secondary | ICD-10-CM | POA: Diagnosis not present

## 2020-05-10 LAB — CMP (CANCER CENTER ONLY)
ALT: 14 U/L (ref 0–44)
AST: 26 U/L (ref 15–41)
Albumin: 4.4 g/dL (ref 3.5–5.0)
Alkaline Phosphatase: 70 U/L (ref 38–126)
Anion gap: 9 (ref 5–15)
BUN: 6 mg/dL (ref 6–20)
CO2: 29 mmol/L (ref 22–32)
Calcium: 9.6 mg/dL (ref 8.9–10.3)
Chloride: 101 mmol/L (ref 98–111)
Creatinine: 0.78 mg/dL (ref 0.44–1.00)
GFR, Estimated: >60 mL/min (ref >60)
Glucose, Bld: 91 mg/dL (ref 70–99)
Potassium: 4.3 mmol/L (ref 3.5–5.1)
Sodium: 139 mmol/L (ref 135–145)
Total Bilirubin: 0.6 mg/dL (ref 0.3–1.2)
Total Protein: 8.9 g/dL — ABNORMAL HIGH (ref 6.5–8.1)

## 2020-05-10 LAB — CBC WITH DIFFERENTIAL (CANCER CENTER ONLY)
Abs Immature Granulocytes: 0.01 10*3/uL (ref 0.00–0.07)
Basophils Absolute: 0 10*3/uL (ref 0.0–0.1)
Basophils Relative: 1 %
Eosinophils Absolute: 0 10*3/uL (ref 0.0–0.5)
Eosinophils Relative: 1 %
HCT: 41.8 % (ref 36.0–46.0)
Hemoglobin: 13.6 g/dL (ref 12.0–15.0)
Immature Granulocytes: 0 %
Lymphocytes Relative: 25 %
Lymphs Abs: 0.7 10*3/uL (ref 0.7–4.0)
MCH: 28.8 pg (ref 26.0–34.0)
MCHC: 32.5 g/dL (ref 30.0–36.0)
MCV: 88.6 fL (ref 80.0–100.0)
Monocytes Absolute: 0.5 10*3/uL (ref 0.1–1.0)
Monocytes Relative: 17 %
Neutro Abs: 1.5 10*3/uL — ABNORMAL LOW (ref 1.7–7.7)
Neutrophils Relative %: 56 %
Platelet Count: 234 10*3/uL (ref 150–400)
RBC: 4.72 MIL/uL (ref 3.87–5.11)
RDW: 13.3 % (ref 11.5–15.5)
WBC Count: 2.8 10*3/uL — ABNORMAL LOW (ref 4.0–10.5)
nRBC: 0 % (ref 0.0–0.2)

## 2020-05-10 LAB — SAVE SMEAR(SSMR), FOR PROVIDER SLIDE REVIEW

## 2020-05-10 LAB — LACTATE DEHYDROGENASE: LDH: 229 U/L — ABNORMAL HIGH (ref 98–192)

## 2020-05-10 NOTE — Progress Notes (Signed)
[place] [phone]   [phone]  PROGRESS NOTE  Patient Care Team: Lujean Amel, MD as PCP - General (Family Medicine)  Hematological/Oncological History # Leukopenia, Neutropenia 1) 02/06/2007: WBC 3.0, Hgb 13.4, Plt 280. ANC 1400 2) 08/02/2013: WBC 2.0, Hgb 13.4, Plt 126 3) 12/17/2013: WBC 2.1, Hgb 12.5, Plt 216 4) 09/20/2019: WBC 2.1, Hgb 12.4, MCV 86, Plt 231, ANC 700 5) 10/19/2019: Establish care with Dr. Lorenso Courier. WBC 3.7, ANC 2.6 6) 05/10/2020: WBC 2.8, Hgb 13.6, MCV 88.6, Plt 234, ANC 1.5   Interval History:  Martha Schneider 55 y.o. female with medical history significant for neutropenia presents for a follow up visit. The patient's last visit was on 10/20/2019 at which time she established care. In the interim since the last visit she is established with pulmonology for sarcoidosis.  On exam today Ms. Schorsch is accompanied by her husband.  She notes that she is "been doing okay".  She also does occasionally have some trouble breathing and some occasional back pain.  She denies any fevers, chills, sweats, nausea, vomiting or diarrhea.  She denies any infectious symptoms such as urinary tract infection or diarrhea.  She reports her energy is good, however she constantly works and therefore does not eat as much as she should.  She is lost about 3 pounds of weight in the interim since our last visit.  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  Overall there have been no major changes in her health since her last visit.  A full 10 point ROS is listed below.  MEDICAL HISTORY:  Past Medical History:  Diagnosis Date  . Hypertension   . Sarcoidosis     SURGICAL HISTORY: No past surgical history on file.  SOCIAL HISTORY: Social History   Socioeconomic History  . Marital status: Married    Spouse name: Not on file  . Number of children: 2  . Years of education: Not on file  . Highest education level: Not on file  Occupational  History  . Not on file  Tobacco Use  . Smoking status: Current Every Day Smoker    Packs/day: 0.50    Years: 37.00    Pack years: 18.50  . Smokeless tobacco: Never Used  Vaping Use  . Vaping Use: Never used  Substance and Sexual Activity  . Alcohol use: Yes    Comment: occasionally  . Drug use: No  . Sexual activity: Not on file  Other Topics Concern  . Not on file  Social History Narrative  . Not on file   Social Determinants of Health   Financial Resource Strain: Not on file  Food Insecurity: Not on file  Transportation Needs: Not on file  Physical Activity: Not on file  Stress: Not on file  Social Connections: Not on file  Intimate Partner Violence: Not on file    FAMILY HISTORY: Family History  Problem Relation Age of Onset  . Heart attack Mother   . Liver cancer Father   . Diabetes Mellitus II Sister   . Kidney disease Sister     ALLERGIES:  has No Known Allergies.  MEDICATIONS:  Current Outpatient Medications  Medication Sig Dispense Refill  . amLODipine (NORVASC) 5 MG tablet Take 5 mg by mouth daily.    Marland Kitchen aspirin 325 MG tablet Take 325 mg by mouth daily.    Marland Kitchen dextromethorphan-guaiFENesin (MUCINEX DM) 30-600 MG per 12 hr tablet Take 1 tablet by mouth 2 (two) times  daily as needed for cough.    . predniSONE (DELTASONE) 20 MG tablet Take 1 tablet (20 mg total) by mouth daily with breakfast. 21 tablet 0  . sertraline (ZOLOFT) 100 MG tablet Take 100 mg by mouth daily.     No current facility-administered medications for this visit.    REVIEW OF SYSTEMS:   Constitutional: ( - ) fevers, ( - )  chills , ( - ) night sweats Eyes: ( - ) blurriness of vision, ( - ) double vision, ( - ) watery eyes Ears, nose, mouth, throat, and face: ( - ) mucositis, ( - ) sore throat Respiratory: ( - ) cough, ( - ) dyspnea, ( - ) wheezes Cardiovascular: ( - ) palpitation, ( - ) chest discomfort, ( - ) lower extremity swelling Gastrointestinal:  ( - ) nausea, ( - ) heartburn, (  - ) change in bowel habits Skin: ( - ) abnormal skin rashes Lymphatics: ( - ) new lymphadenopathy, ( - ) easy bruising Neurological: ( - ) numbness, ( - ) tingling, ( - ) new weaknesses Behavioral/Psych: ( - ) mood change, ( - ) new changes  All other systems were reviewed with the patient and are negative.  PHYSICAL EXAMINATION: ECOG PERFORMANCE STATUS: 0 - Asymptomatic  Vitals:   05/10/20 1136  BP: (!) 178/95  Pulse: 80  Resp: 20  Temp: 97.6 F (36.4 C)  SpO2: 100%   Filed Weights   05/10/20 1136  Weight: 117 lb 11.2 oz (53.4 kg)    GENERAL: well appearing middle aged [place] American female. alert, no distress and comfortable SKIN: skin color, texture, turgor are normal, no rashes or significant lesions EYES: conjunctiva are pink and non-injected, sclera clear LUNGS: clear to auscultation and percussion with normal breathing effort HEART: regular rate & rhythm and no murmurs and no lower extremity edema Musculoskeletal: no cyanosis of digits and no clubbing  PSYCH: alert & oriented x 3, fluent speech NEURO: no focal motor/sensory deficits  LABORATORY DATA:  I have reviewed the data as listed CBC Latest Ref Rng & Units 05/10/2020 10/20/2019 12/17/2013  WBC 4.0 - 10.5 K/uL 2.8(L) 3.7(L) 2.1(L)  Hemoglobin 12.0 - 15.0 g/dL 13.6 14.4 12.5  Hematocrit 36.0 - 46.0 % 41.8 43.2 37.0  Platelets 150 - 400 K/uL 234 233 216    CMP Latest Ref Rng & Units 05/10/2020 10/20/2019 12/17/2013  Glucose 70 - 99 mg/dL 91 98 95  BUN 6 - 20 mg/dL 6 7 12   Creatinine 0.44 - 1.00 mg/dL 0.78 0.77 0.76  Sodium 135 - 145 mmol/L 139 141 138  Potassium 3.5 - 5.1 mmol/L 4.3 3.9 4.1  Chloride 98 - 111 mmol/L 101 101 102  CO2 22 - 32 mmol/L 29 31 30   Calcium 8.9 - 10.3 mg/dL 9.6 9.5 8.9  Total Protein 6.5 - 8.1 g/dL 8.9(H) 9.4(H) 7.2  Total Bilirubin 0.3 - 1.2 mg/dL 0.6 0.6 0.5  Alkaline Phos 38 - 126 U/L 70 78 54  AST 15 - 41 U/L 26 30 21   ALT 0 - 44 U/L 14 22 14     RADIOGRAPHIC STUDIES: No  results found.  ASSESSMENT & PLAN Martha Schneider 55 y.o. female with medical history significant for neutropenia presents for a follow up visit.   # Leukopenia, Neutropenia --today will order repeat CBC, CMP, LDH, and peripheral blood smear --nutritional evaluation with vitamin b12, folate, MMA, and homocysteine showed no deficiencies.  --viral workup with Hep B, Hep C, and HIV was also negative --  if worsening leukopenia is noted may need to consider bone marrow biopsy to further evaluate. It is possible there is Bone marrow involvement of her sarcoidosis --RTC PRN. Recommend re-referral in the event the [place] consistently <1000 or if other abnormalities were to develop.   #Pulmonary Sarcoidosis --patient has received no treatment since she moved to [facility]. She took steroids "years" ago --no currently having shortness of breath or cough -- will order CT chest to assess status o sarcoid and r/o lung malignancy given smoking history and recent history of weight loss.  --can refer to pulmonary if sarcoidosis appears active.   No orders of the defined types were placed in this encounter.   All questions were answered. The patient knows to call the clinic with any problems, questions or concerns.  A total of more than 30 minutes were spent on this encounter and over half of that time was spent on counseling and coordination of care as outlined above.   Ledell Peoples, MD Department of Hematology/Oncology [place] [facility] Phone: [phone] Pager: [phone] Email: [email or web address]  05/10/2020 5:29 PM

## 2020-05-13 ENCOUNTER — Telehealth: Payer: Self-pay | Admitting: Hematology and Oncology

## 2020-05-13 NOTE — Telephone Encounter (Signed)
Per 1/12 los, no changes made to pt schedule  °

## 2020-06-07 DIAGNOSIS — M549 Dorsalgia, unspecified: Secondary | ICD-10-CM | POA: Diagnosis not present

## 2020-06-07 DIAGNOSIS — U071 COVID-19: Secondary | ICD-10-CM | POA: Diagnosis not present

## 2020-06-07 DIAGNOSIS — J381 Polyp of vocal cord and larynx: Secondary | ICD-10-CM | POA: Diagnosis not present

## 2020-07-12 DIAGNOSIS — M79604 Pain in right leg: Secondary | ICD-10-CM | POA: Diagnosis not present

## 2020-07-13 ENCOUNTER — Other Ambulatory Visit (HOSPITAL_COMMUNITY): Payer: Self-pay | Admitting: Family Medicine

## 2020-07-13 ENCOUNTER — Inpatient Hospital Stay (HOSPITAL_COMMUNITY): Admission: RE | Admit: 2020-07-13 | Payer: BC Managed Care – PPO | Source: Ambulatory Visit

## 2020-07-13 DIAGNOSIS — M79604 Pain in right leg: Secondary | ICD-10-CM

## 2020-07-14 ENCOUNTER — Ambulatory Visit (HOSPITAL_COMMUNITY)
Admission: RE | Admit: 2020-07-14 | Discharge: 2020-07-14 | Disposition: A | Discharge status: Home / Self Care | Payer: BC Managed Care – PPO | Source: Ambulatory Visit | Attending: Family Medicine | Admitting: Family Medicine

## 2020-07-14 ENCOUNTER — Other Ambulatory Visit: Payer: Self-pay

## 2020-07-14 DIAGNOSIS — M79604 Pain in right leg: Secondary | ICD-10-CM | POA: Diagnosis not present

## 2020-07-28 DIAGNOSIS — F419 Anxiety disorder, unspecified: Secondary | ICD-10-CM | POA: Diagnosis not present

## 2020-07-28 DIAGNOSIS — I10 Essential (primary) hypertension: Secondary | ICD-10-CM | POA: Diagnosis not present

## 2020-07-28 DIAGNOSIS — J381 Polyp of vocal cord and larynx: Secondary | ICD-10-CM | POA: Diagnosis not present

## 2020-07-28 DIAGNOSIS — F17200 Nicotine dependence, unspecified, uncomplicated: Secondary | ICD-10-CM | POA: Diagnosis not present

## 2021-04-03 ENCOUNTER — Observation Stay (HOSPITAL_COMMUNITY)
Admission: EM | Admit: 2021-04-03 | Discharge: 2021-04-04 | Disposition: A | Discharge status: Home / Self Care | Payer: BC Managed Care – PPO | Attending: Internal Medicine | Admitting: Internal Medicine

## 2021-04-03 ENCOUNTER — Encounter (HOSPITAL_COMMUNITY): Payer: Self-pay | Admitting: Internal Medicine

## 2021-04-03 ENCOUNTER — Emergency Department (HOSPITAL_COMMUNITY): Payer: BC Managed Care – PPO

## 2021-04-03 ENCOUNTER — Other Ambulatory Visit: Payer: Self-pay

## 2021-04-03 DIAGNOSIS — I10 Essential (primary) hypertension: Secondary | ICD-10-CM | POA: Diagnosis present

## 2021-04-03 DIAGNOSIS — F32A Depression, unspecified: Secondary | ICD-10-CM | POA: Diagnosis not present

## 2021-04-03 DIAGNOSIS — F17210 Nicotine dependence, cigarettes, uncomplicated: Secondary | ICD-10-CM | POA: Insufficient documentation

## 2021-04-03 DIAGNOSIS — R569 Unspecified convulsions: Principal | ICD-10-CM

## 2021-04-03 DIAGNOSIS — Z79899 Other long term (current) drug therapy: Secondary | ICD-10-CM | POA: Diagnosis not present

## 2021-04-03 DIAGNOSIS — Z20822 Contact with and (suspected) exposure to covid-19: Secondary | ICD-10-CM | POA: Diagnosis not present

## 2021-04-03 DIAGNOSIS — Z720 Tobacco use: Secondary | ICD-10-CM | POA: Diagnosis present

## 2021-04-03 DIAGNOSIS — Z7982 Long term (current) use of aspirin: Secondary | ICD-10-CM | POA: Insufficient documentation

## 2021-04-03 DIAGNOSIS — D72819 Decreased white blood cell count, unspecified: Secondary | ICD-10-CM | POA: Diagnosis not present

## 2021-04-03 DIAGNOSIS — Y900 Blood alcohol level of less than 20 mg/100 ml: Secondary | ICD-10-CM | POA: Insufficient documentation

## 2021-04-03 HISTORY — DX: Depression, unspecified: F32.A

## 2021-04-03 LAB — CBC WITH DIFFERENTIAL/PLATELET
Abs Immature Granulocytes: 0.01 10*3/uL (ref 0.00–0.07)
Basophils Absolute: 0 10*3/uL (ref 0.0–0.1)
Basophils Relative: 0 %
Eosinophils Absolute: 0 10*3/uL (ref 0.0–0.5)
Eosinophils Relative: 1 %
HCT: 36.6 % (ref 36.0–46.0)
Hemoglobin: 11.7 g/dL — ABNORMAL LOW (ref 12.0–15.0)
Immature Granulocytes: 0 %
Lymphocytes Relative: 21 %
Lymphs Abs: 0.6 10*3/uL — ABNORMAL LOW (ref 0.7–4.0)
MCH: 28.7 pg (ref 26.0–34.0)
MCHC: 32 g/dL (ref 30.0–36.0)
MCV: 89.9 fL (ref 80.0–100.0)
Monocytes Absolute: 0.5 10*3/uL (ref 0.1–1.0)
Monocytes Relative: 18 %
Neutro Abs: 1.6 10*3/uL — ABNORMAL LOW (ref 1.7–7.7)
Neutrophils Relative %: 60 %
Platelets: 210 10*3/uL (ref 150–400)
RBC: 4.07 MIL/uL (ref 3.87–5.11)
RDW: 13.6 % (ref 11.5–15.5)
WBC: 2.7 10*3/uL — ABNORMAL LOW (ref 4.0–10.5)
nRBC: 0 % (ref 0.0–0.2)

## 2021-04-03 LAB — RESP PANEL BY RT-PCR (FLU A&B, COVID) ARPGX2
Influenza A by PCR: NEGATIVE
Influenza B by PCR: NEGATIVE
SARS Coronavirus 2 by RT PCR: NEGATIVE

## 2021-04-03 LAB — COMPREHENSIVE METABOLIC PANEL
ALT: 16 U/L (ref 0–44)
AST: 26 U/L (ref 15–41)
Albumin: 3.7 g/dL (ref 3.5–5.0)
Alkaline Phosphatase: 59 U/L (ref 38–126)
Anion gap: 9 (ref 5–15)
BUN: 14 mg/dL (ref 6–20)
CO2: 28 mmol/L (ref 22–32)
Calcium: 9.1 mg/dL (ref 8.9–10.3)
Chloride: 101 mmol/L (ref 98–111)
Creatinine, Ser: 0.78 mg/dL (ref 0.44–1.00)
GFR, Estimated: >60 mL/min (ref >60)
Glucose, Bld: 107 mg/dL — ABNORMAL HIGH (ref 70–99)
Potassium: 3.7 mmol/L (ref 3.5–5.1)
Sodium: 138 mmol/L (ref 135–145)
Total Bilirubin: 0.6 mg/dL (ref 0.3–1.2)
Total Protein: 7 g/dL (ref 6.5–8.1)

## 2021-04-03 LAB — RAPID URINE DRUG SCREEN, HOSP PERFORMED
Amphetamines: NOT DETECTED
Barbiturates: NOT DETECTED
Benzodiazepines: NOT DETECTED
Cocaine: NOT DETECTED
Opiates: NOT DETECTED
Tetrahydrocannabinol: NOT DETECTED

## 2021-04-03 IMAGING — CT CT HEAD W/O CM
3 series · 16 of 47 positions shown, 19 images · non-contrast
Comparison: None.

CLINICAL DATA: Seizure, nontraumatic.

EXAM:
CT HEAD WITHOUT CONTRAST
TECHNIQUE: Contiguous axial images were obtained from the base of the skull
through the vertex without intravenous contrast.

[Series 3: head 5.0 h30s · axial · 0.41mm/px · z∈[-114,+26]mm · 10 of 34 slices shown, 13 images]
[im 3/34  brain]
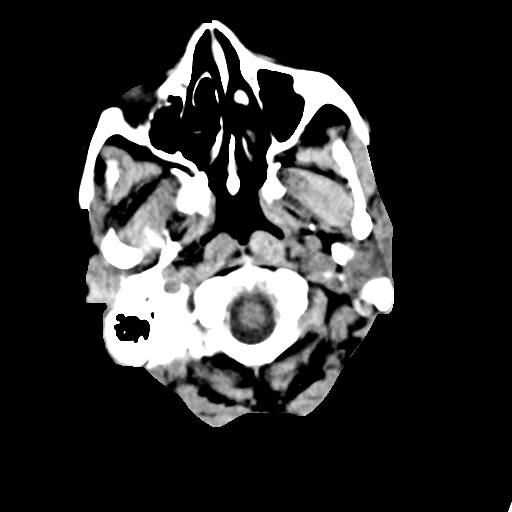
[im 3/34  bone]
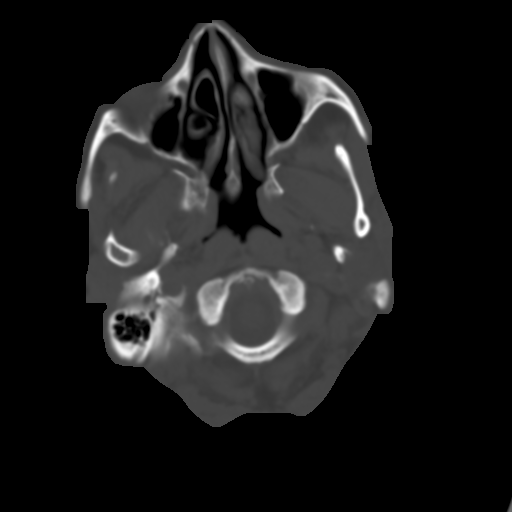
[im 6/34  brain]
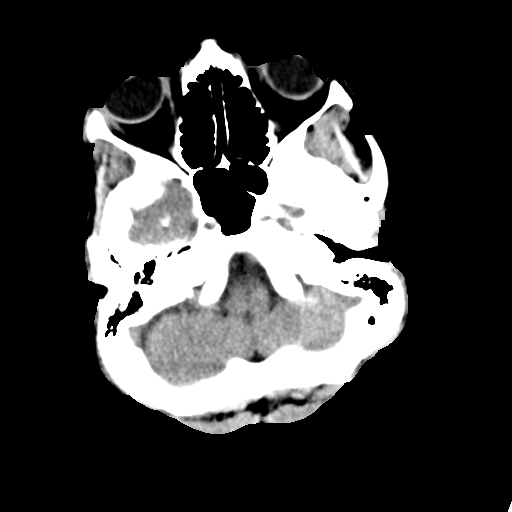
[im 10/34  brain]
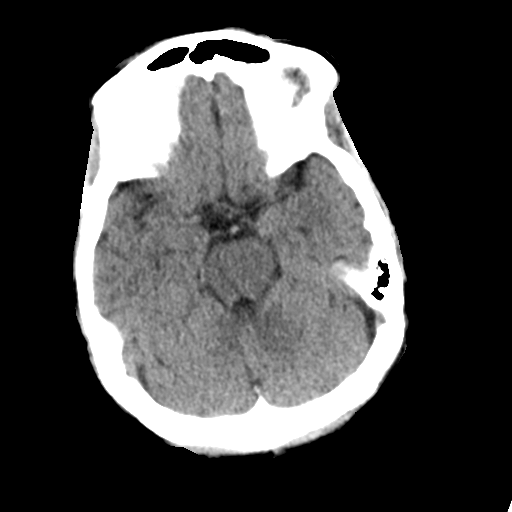
[im 12/34  brain]
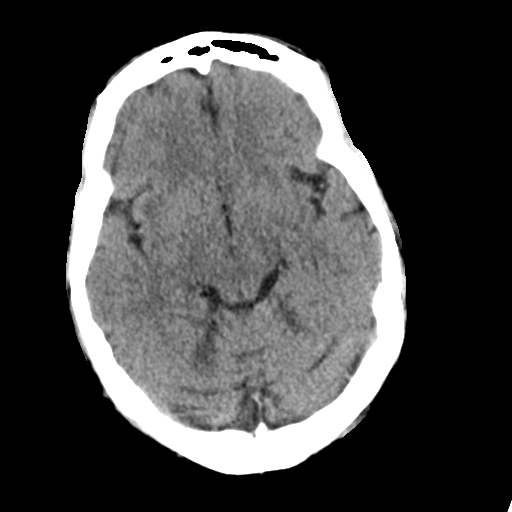
[im 15/34  brain]
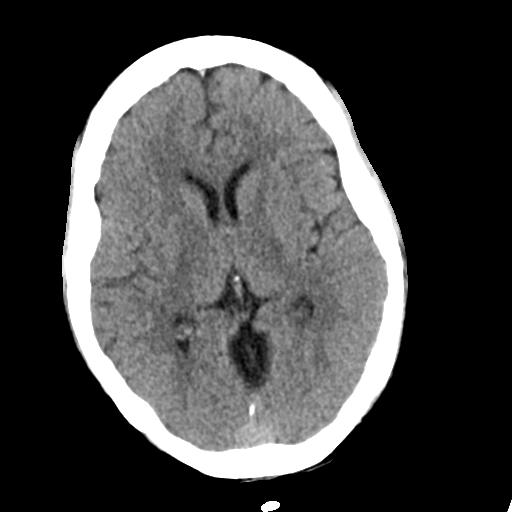
[im 15/34  bone]
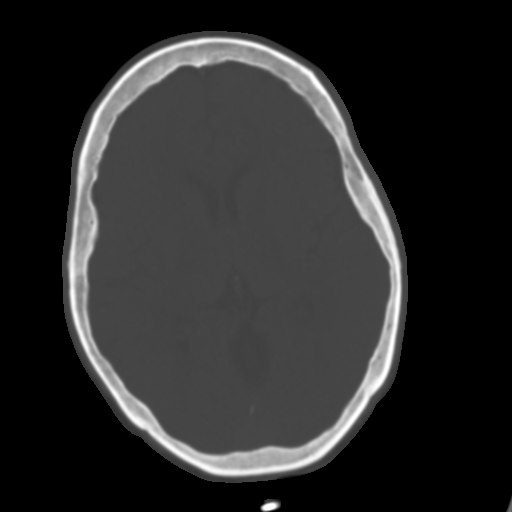
[im 19/34  brain]
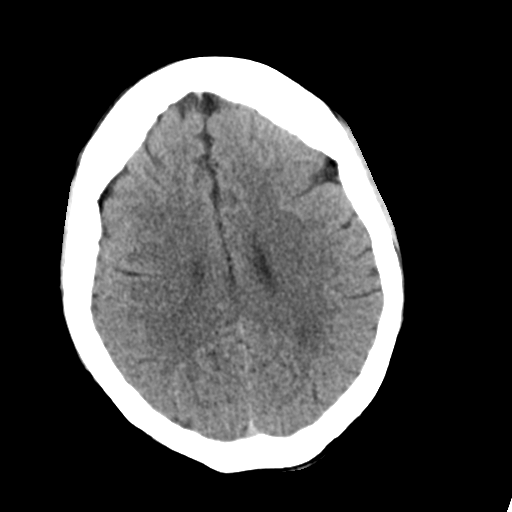
[im 22/34  brain]
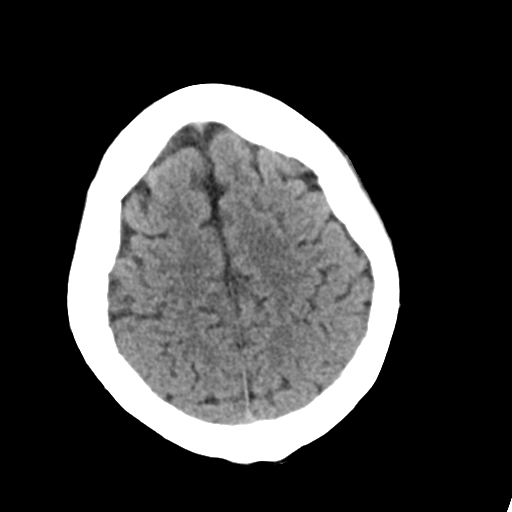
[im 26/34  brain]
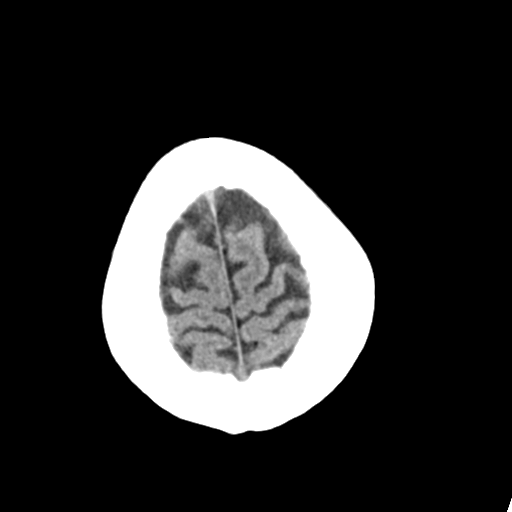
[im 28/34  brain]
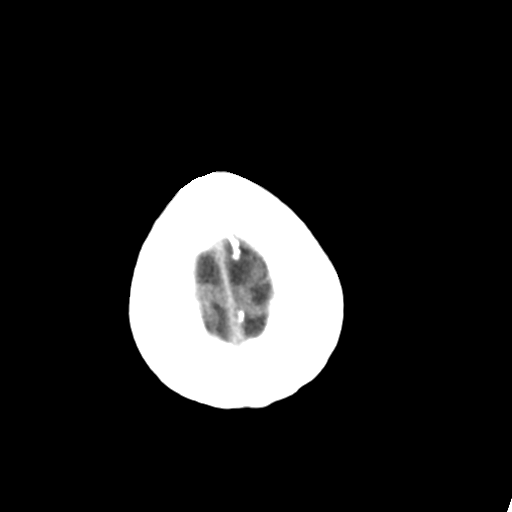
[im 28/34  bone]
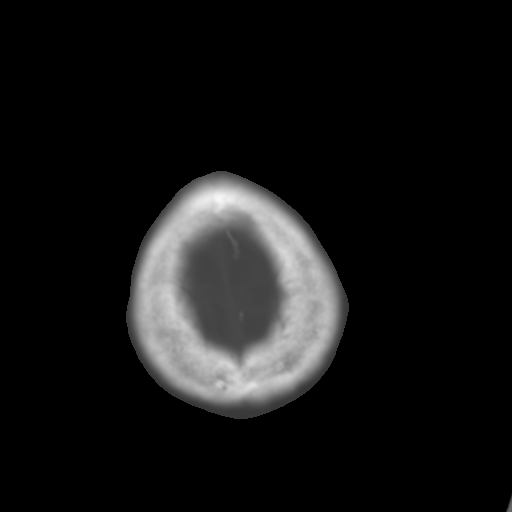
[im 31/34  brain]
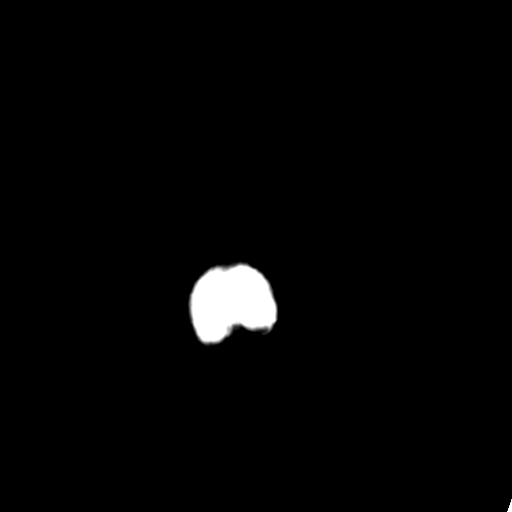

[Series 5: head 3.0 mpr cor · coronal · 0.32mm/px · 3 of 67 slices shown]
[im 23/67  brain]
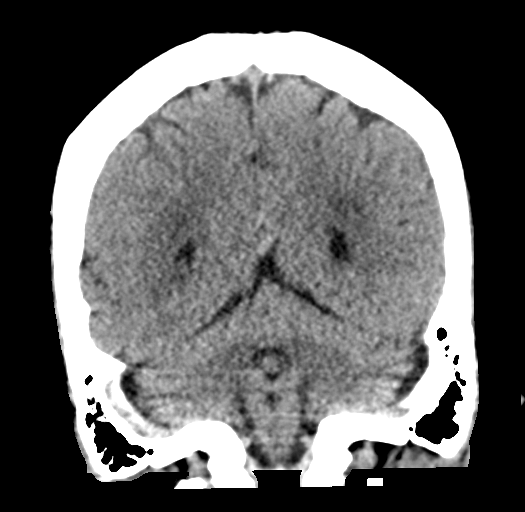
[im 30/67  brain]
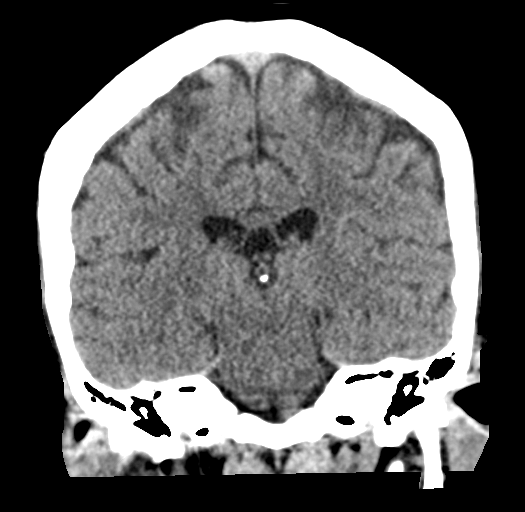
[im 37/67  brain]
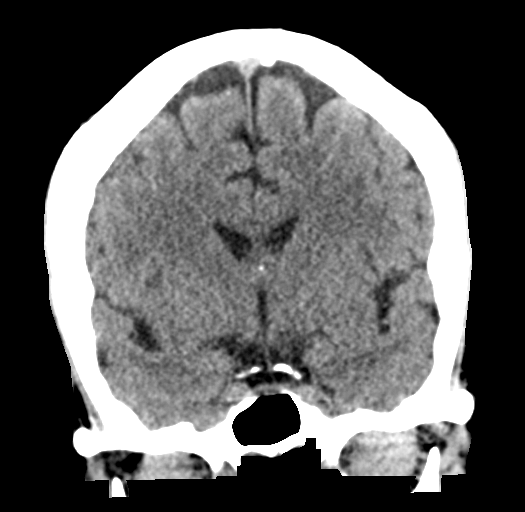

[Series 6: head 3.0 mpr sag · sagittal · 0.34mm/px · 3 of 57 slices shown]
[im 19/57  brain]
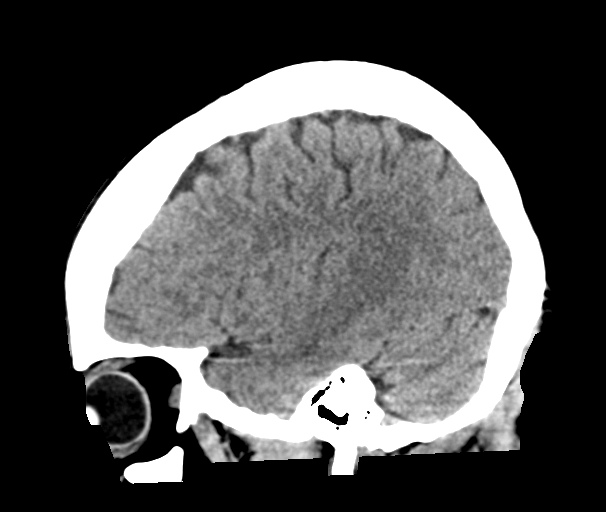
[im 29/57  brain]
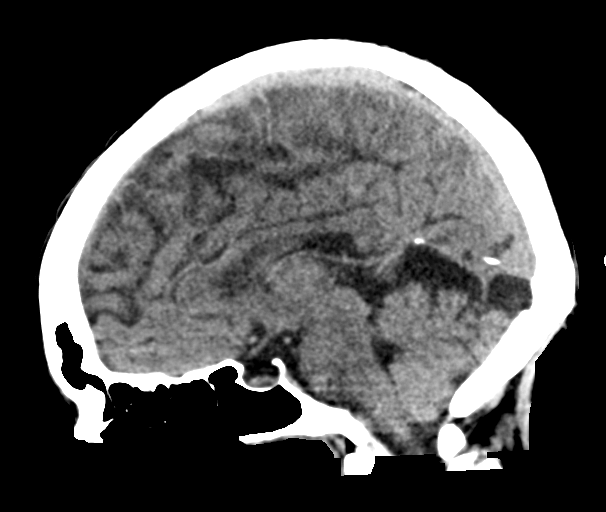
[im 38/57  brain]
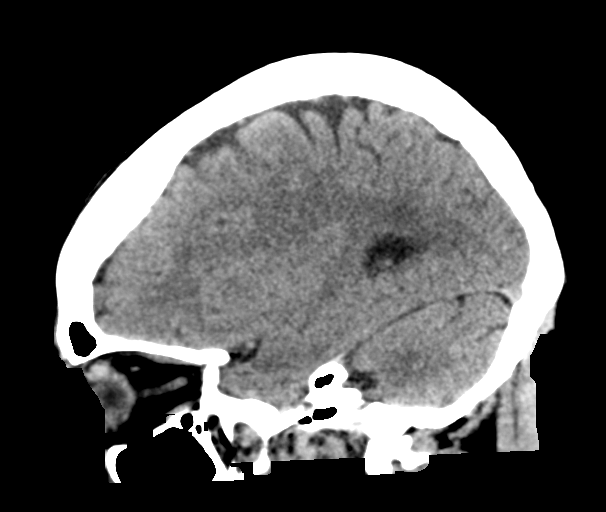

[16 of 47 positions shown; findings below may reference images not displayed]

FINDINGS: Brain: No acute intracranial hemorrhage, midline shift or mass
effect. No extra-axial fluid collection is identified. Gray-white
matter differentiation is within normal limits and there is no
hydrocephalus. There is a CSF attenuation region in the posterior
fossa in the midline, possible arachnoid cyst.

Vascular: No hyperdense vessel or unexpected calcification.

Skull: Normal. Negative for fracture or focal lesion.

Sinuses/Orbits: No acute finding.

Other: None.
IMPRESSION: No acute intracranial process.

## 2021-04-03 MED ORDER — ACETAMINOPHEN 500 MG PO TABS
1000.0000 mg | ORAL_TABLET | Freq: Once | ORAL | Status: AC
  Administered 2021-04-03: 1000 mg via ORAL
  Filled 2021-04-03: qty 2

## 2021-04-03 MED ORDER — AMLODIPINE BESYLATE 5 MG PO TABS
5.0000 mg | ORAL_TABLET | Freq: Every day | ORAL | Status: DC
  Administered 2021-04-04: 5 mg via ORAL
  Filled 2021-04-03: qty 1

## 2021-04-03 MED ORDER — ACETAMINOPHEN 650 MG RE SUPP: 650.0000 mg | Freq: Four times a day (QID) | RECTAL | Status: DC | PRN

## 2021-04-03 MED ORDER — LORAZEPAM 2 MG/ML IJ SOLN
1.0000 mg | INTRAMUSCULAR | Status: DC | PRN
  Administered 2021-04-04: 1 mg via INTRAVENOUS
  Filled 2021-04-03: qty 1

## 2021-04-03 MED ORDER — HYDROCODONE-ACETAMINOPHEN 5-325 MG PO TABS
1.0000 | ORAL_TABLET | Freq: Four times a day (QID) | ORAL | Status: DC | PRN
  Administered 2021-04-04: 1 via ORAL
  Filled 2021-04-03: qty 1

## 2021-04-03 MED ORDER — ACETAMINOPHEN 325 MG PO TABS: 650.0000 mg | ORAL_TABLET | Freq: Four times a day (QID) | ORAL | Status: DC | PRN

## 2021-04-03 NOTE — ED Notes (Signed)
Received verbal report from Sarah B RN at this time °

## 2021-04-03 NOTE — Consult Note (Signed)
Neurology Consultation Note  Consult Requested by: Dr. Arlean Hopping  Reason for Consult: New onset seizure  Consult Date: 04/03/21   The history was obtained from the patient and her husband.  During history and examination, all items were able to obtain unless otherwise noted.  History of Present Illness:  Martha Schneider is a 55 y.o. African American female with PMH of hypertension, depression on Zoloft, ?  Sarcoidosis following with pulmonary, smoker presented to ED after seizure.  Per husband and patient, this afternoon patient was taking break at her work, she was sitting inside her car, talking to her husband over the phone.  Then husband asking her questions and she only stated "I don't know" and the husband heard patient dropped the cell phone.  Moments later, patient's coworker called patient husband stating that patient was whole body shaking jerking, loss of conscious inside her car.  EMS was called, per EDP, on EMS arrival patient still has briefly shaking activity, later on husband arrival, husband reported patient has no shaking jerking but able to open eyes responding to his voice but eyes dazed.  Estimated seizure episode may lasting 4 to 5 minutes.  Patient reported that she could remember she was in the ambulance with EMS.  Currently ED, patient back to her baseline.  She denies any history of seizure or family history of seizure.  She had a fall about a year ago without complication.  She stated that her job very stressful, especially for the last 2 weeks, she started have headache also for the last 2 weeks due to stressful work.  She is smoker, and willing to quit at this time.  She is social drinker, denies any drug use.  UDS negative in ER.  CT no acute abnormality.  She takes medication for hypertension, also on Zoloft for depression, no recent dose change.  Follow-up with Dr. Delton Coombes for questionable several doses, took prednisone 20 mg daily for 3 weeks last year.   Past Medical  History:  Diagnosis Date   Hypertension    Sarcoidosis     No past surgical history on file.  Family History  Problem Relation Age of Onset   Heart attack Mother    Liver cancer Father    Diabetes Mellitus II Sister    Kidney disease Sister     Social History:  reports that she has been smoking. She has a 18.50 pack-year smoking history. She has never used smokeless tobacco. She reports current alcohol use. She reports that she does not use drugs.  Allergies: No Known Allergies  No current facility-administered medications on file prior to encounter.   Current Outpatient Medications on File Prior to Encounter  Medication Sig Dispense Refill   amLODipine (NORVASC) 5 MG tablet Take 5 mg by mouth daily.     aspirin 325 MG tablet Take 325 mg by mouth daily.     dextromethorphan-guaiFENesin (MUCINEX DM) 30-600 MG per 12 hr tablet Take 1 tablet by mouth 2 (two) times daily as needed for cough.     predniSONE (DELTASONE) 20 MG tablet Take 1 tablet (20 mg total) by mouth daily with breakfast. 21 tablet 0   sertraline (ZOLOFT) 100 MG tablet Take 100 mg by mouth daily.      Review of Systems: A full ROS was attempted today and was able to be performed.  Systems assessed include - Constitutional, Eyes, HENT, Respiratory, Cardiovascular, Gastrointestinal, Genitourinary, Integument/breast, Hematologic/lymphatic, Musculoskeletal, Neurological, Behavioral/Psych, Endocrine, Allergic/Immunologic - with pertinent responses as per HPI.  Physical Examination: Temp:  [98.8 F (37.1 C)] 98.8 F (37.1 C) (12/06 1745) Pulse Rate:  [77-86] 78 (12/06 2130) Resp:  [14-18] 16 (12/06 2130) BP: (128-146)/(80-92) 139/92 (12/06 2130) SpO2:  [96 %-98 %] 96 % (12/06 2130) Weight:  [54.4 kg] 54.4 kg (12/06 1743)  General - well nourished, well developed, in no apparent distress.    Ophthalmologic - fundi not visualized due to noncooperation.    Cardiovascular - regular rhythm and rate  Mental Status -   Level of arousal and orientation to time, place, and person were intact. Language including expression, naming, repetition, comprehension, reading, and writing was assessed and found intact. Attention span and concentration were normal. Fund of Knowledge was assessed and was intact.  Cranial Nerves II - XII - II - Vision intact OU. III, IV, VI - Extraocular movements intact. V - Facial sensation intact bilaterally. VII - Facial movement intact bilaterally. VIII - Hearing & vestibular intact bilaterally. X - Palate elevates symmetrically. XI - Chin turning & shoulder shrug intact bilaterally. XII - Tongue protrusion intact.  Motor Strength - The patient's strength was normal in all extremities and pronator drift was absent.   Motor Tone & Bulk - Muscle tone was assessed at the neck and appendages and was normal.  Bulk was normal and fasciculations were absent.   Reflexes - The patient's reflexes were normal in all extremities and she had no pathological reflexes.  Sensory - Light touch, temperature/pinprick were assessed and were normal.    Coordination - The patient had normal movements in the hands and feet with no ataxia or dysmetria.  Tremor was absent.  Gait and Station - deferred  Data Reviewed: CT Head Wo Contrast  Result Date: 04/03/2021 CLINICAL DATA:  Seizure, nontraumatic. EXAM: CT HEAD WITHOUT CONTRAST TECHNIQUE: Contiguous axial images were obtained from the base of the skull through the vertex without intravenous contrast. COMPARISON:  None. FINDINGS: Brain: No acute intracranial hemorrhage, midline shift or mass effect. No extra-axial fluid collection is identified. Gray-white matter differentiation is within normal limits and there is no hydrocephalus. There is a CSF attenuation region in the posterior fossa in the midline, possible arachnoid cyst. Vascular: No hyperdense vessel or unexpected calcification. Skull: Normal. Negative for fracture or focal lesion.  Sinuses/Orbits: No acute finding. Other: None. IMPRESSION: No acute intracranial process. Electronically Signed   By: Thornell Sartorius M.D.   On: 04/03/2021 20:03    Assessment: 55 y.o. female with PMH of hypertension, depression on Zoloft, ? Sarcoidosis following with pulmonary, smoker presented to ED after new onset seizure.  Seizure witnessed by coworker inside her car, tonic-clonic seizure, may lasted for 5 minutes.  Currently at baseline.  No history of seizure.  Admitted stressful work recently.  UDS negative, CT no acute abnormality.  On Zoloft and Norvasc at home, no recent change.  Etiology for new onset seizure not quite clear, may related to stress work and lower the seizure threshold.  However, need to further seizure work-up given new onset, questionable history of sarcoidosis.  Will need MRI with and without contrast and EEG.  No AED needed at this time.  Patient cannot drive until seizure-free for 6 months and under physician care.  Patient and husband informed and understood.  Plan: - Recommend hospitalist service admission and continue seizure work up - neuro monitoring - seizure precautions - MRI with and without contrast  - Routine EEG.   - No AED needed at this time.   - Patient cannot drive  until seizure-free for 6 months and under physician care.  Patient and husband informed and understood. - Discussed with EDP - will follow  Thank you for this consultation and allowing [place] to participate in the care of this patient.

## 2021-04-03 NOTE — ED Triage Notes (Signed)
Pt was found by coworker having full body seizure activity in her vehicle at work. FD arrived approximately 4 mins later and she was still seizing. On EMS arrival patient had stopped seizing without intervention but was confused. Pt does not remember how she even got in her vehicle. A/O x 4 now. Denies hx of seizures.

## 2021-04-03 NOTE — ED Notes (Addendum)
Pt was speaking with husband on the phone became confused and then didn't respond to spouse on the phone. Seen by witnesses in her car shaking during this time. Pt doesn't have any memory of situation. Pt only complaints at this time is a headache. Per pt and spouse increase in stress recently and this has never happened before. Message sent to provider reference something for headache

## 2021-04-03 NOTE — ED Notes (Signed)
Pt ambulated to restroom with assistance at this time 

## 2021-04-03 NOTE — ED Notes (Signed)
Neuro provider at bedside  

## 2021-04-03 NOTE — ED Notes (Signed)
MRI called pt is claustrophobic meds are ordered for her to receive prior to going for the test. pt

## 2021-04-03 NOTE — H&P (Signed)
History and Physical    PLEASE NOTE THAT DRAGON DICTATION SOFTWARE WAS USED IN THE CONSTRUCTION OF THIS NOTE.   Martha Schneider PJA:[id number] DOB: Jan 12, 1966 DOA: 04/03/2021  PCP: Lujean Amel, MD  Patient coming from: home   I have personally briefly reviewed patient's old medical records in [place]  Chief Complaint:  seizures  HPI: Martha Schneider is a 55 y.o. female with medical history significant for essential htn, depression, sarcoidosis, chronic leukopenia, who is admitted to [facility] on 04/03/2021 with new onset seizure after presenting from home to [facility] ED via complaining of one episode of tonic-clonic activity.   Dragon dictation not currently functioning. Therefore, portions of this note will be typed, as alterate means of data entry.   In setting of no prior h/o seizures, patient found to be co-worker to be exhibiting tonic-cloinci shaking involving all for extremities, while the patient was sitting in the driver seat of her parked car in company parking lot. She had gone outside to talk on phone with husband and was sitting in her car at the time. Husband said that , during their phone call, that patient started repeatign over and over "I doon't know", at least 10 times before silence ensued , and could tell that co-worker then found pt in the car without a few mins of ensuing phone silence. EMS then called, and shaking activity rewsulvoed spontaneously prior to their arrival , wioth ems subseuqently taking pt to [facility] ED for further eval. No tongue biting. No loss of bowel/ bladder fxn. No subsequent seizure liek acitivity. Brief peroid of diminisned responsive / confusion ensued for about 15-20 mintues following conclusion of reported seizure like activity. Mental status now back to baseline.   No any any anti-antileptic meds at home. No recent changes to home medication regimen. Denies any history of alcohol abuse or routine/recent alcohol consumption.  No history of  recreational drug use, including no abuse of stimulant class recreational drugs, including no use of cocaine, methamphetamine, or amphetamines. PRN dextromethoraphan listed in home med list, but patient denies any recent use of this. Denies any chronic or recent use of benzodiazepines, opioids,but notes use of zoloft on scheduled basis.   No history of diabetes and does not take insulin or sulfonylureas as an outpatient. Patient conveys that she incurred glf that was mechancial and occurred a few months ago, during which she hit her front portion of head on the floor, without LOC. Over the last 2 weeks she has been experineincg b/l front lobe headaches that are completely new for her. Not a/w auro. Otherwise, no recent trauma.    Today's presentation Not a/w any recent acute focal weakness, acute focal numbness, paresthesias, slurred speech, facial droop, vertigo, nausea, vomiting, or acute change in vision.  No recent reported neck stiffness, rash.   No recent subjective fever, chills, rigors, or generalized myalgias. No recent sore throat, sob, wheezing, cough, abdominal pain, diarrhea.  denies any recent dysuria, gross hematuria, or change in urinary urgency/frequency.     ED Course:  Vital signs in the ED were notable for the following:  AF; HR 74-86; bp 128 / 80 - 146 - 85; RR 16-18; O2 sat 96-98 RA.   Labs were notable for the following: cmp noptable for NA 138, bicarb 29 ; creatinine 0.78, glucose 107 and LE's wnl. CBC notable for wbc 2.7 compared to 2.8 in Janu '22, hgb 11.7. covid/flu pcr negative.   Imaging and additional notable ED work-up: ekg showed SR ,  HR 87, no e/o tw or st changes, including no STEMI. CT head showed no acute process.   EDP d/w on-call neuro, Dr. Erlinda Hong, who will formally consutl and recommneded obs to hopsllitatis service for further eva/ management of new onset seizuress, including reocmmending mri brain and eeg in the AM.   While in the ED, the following were  administered: (none).      Review of Systems: As per HPI otherwise 10 point review of systems negative.   Past Medical History:  Diagnosis Date   Depression    Hypertension    Sarcoidosis     History reviewed. No pertinent surgical history.  Social History:  reports that she has been smoking. She has a 18.50 pack-year smoking history. She has never used smokeless tobacco. She reports current alcohol use. She reports that she does not use drugs.   No Known Allergies  Family History  Problem Relation Age of Onset   Heart attack Mother    Liver cancer Father    Diabetes Mellitus II Sister    Kidney disease Sister      Prior to Admission medications   Medication Sig Start Date End Date Taking? Authorizing Provider  amLODipine (NORVASC) 5 MG tablet Take 5 mg by mouth daily. 12/16/19   [provider]  aspirin 325 MG tablet Take 325 mg by mouth daily.    [provider]  dextromethorphan-guaiFENesin (MUCINEX DM) 30-600 MG per 12 hr tablet Take 1 tablet by mouth 2 (two) times daily as needed for cough.    [provider]  predniSONE (DELTASONE) 20 MG tablet Take 1 tablet (20 mg total) by mouth daily with breakfast. 01/12/20   Collene Gobble, MD  sertraline (ZOLOFT) 100 MG tablet Take 100 mg by mouth daily. 12/16/19   [provider]     Objective    Physical Exam: Vitals:   04/03/21 1800 04/03/21 1815 04/03/21 2030 04/03/21 2130  BP: (!) 146/85 133/89 140/89 (!) 139/92  Pulse: 77 81 83 78  Resp: _0 Temp:      TempSrc:      SpO2: 97% 98% 96% 96%  Weight:      Height:        General: appears to be stated age; alert, oriented Skin: warm, dry, no rash Head:  AT/[place] Mouth:  Oral mucosa membranes appear moist, normal dentition Neck: supple; trachea midline Heart:  RRR; did not appreciate any M/R/G Lungs: CTAB, did not appreciate any wheezes, rales, or rhonchi Abdomen: + BS; soft, ND, NT Vascular: 2+ pedal pulses b/l; 2+  radial pulses b/l Extremities: no peripheral edema, no muscle wasting Neuro: strength and sensation intact in upper and lower extremities b/l    Labs on Admission: I have personally reviewed following labs and imaging studies  CBC: Recent Labs  Lab 04/03/21 1820  WBC 2.7*  NEUTROABS 1.6*  HGB 11.7*  HCT 36.6  MCV 89.9  PLT 542   Basic Metabolic Panel: Recent Labs  Lab 04/03/21 1820  NA 138  K 3.7  CL 101  CO2 28  GLUCOSE 107*  BUN 14  CREATININE 0.78  CALCIUM 9.1   GFR: Estimated Creatinine Clearance: 67.4 mL/min (by C-G formula based on SCr of 0.78 mg/dL). Liver Function Tests: Recent Labs  Lab 04/03/21 1820  AST 26  ALT 16  ALKPHOS 59  BILITOT 0.6  PROT 7.0  ALBUMIN 3.7   No results for input(s): LIPASE, AMYLASE in the last 168 hours. No  results for input(s): AMMONIA in the last 168 hours. Coagulation Profile: No results for input(s): INR, PROTIME in the last 168 hours. Cardiac Enzymes: No results for input(s): CKTOTAL, CKMB, CKMBINDEX, TROPONINI in the last 168 hours. BNP (last 3 results) No results for input(s): PROBNP in the last 8760 hours. HbA1C: No results for input(s): HGBA1C in the last 72 hours. CBG: No results for input(s): GLUCAP in the last 168 hours. Lipid Profile: No results for input(s): CHOL, HDL, LDLCALC, TRIG, CHOLHDL, LDLDIRECT in the last 72 hours. Thyroid Function Tests: No results for input(s): TSH, T4TOTAL, FREET4, T3FREE, THYROIDAB in the last 72 hours. Anemia Panel: No results for input(s): VITAMINB12, FOLATE, FERRITIN, TIBC, IRON, RETICCTPCT in the last 72 hours. Urine analysis:    Component Value Date/Time   BILIRUBINUR Negative 12/17/2013 1608   PROTEINUR Negaitve 12/17/2013 1608   UROBILINOGEN 0.2 12/17/2013 1608   NITRITE Negative 12/17/2013 1608   LEUKOCYTESUR Negative 12/17/2013 1608    Radiological Exams on Admission: CT Head Wo Contrast  Result Date: 04/03/2021 CLINICAL DATA:  Seizure, nontraumatic. EXAM:  CT HEAD WITHOUT CONTRAST TECHNIQUE: Contiguous axial images were obtained from the base of the skull through the vertex without intravenous contrast. COMPARISON:  None. FINDINGS: Brain: No acute intracranial hemorrhage, midline shift or mass effect. No extra-axial fluid collection is identified. Gray-white matter differentiation is within normal limits and there is no hydrocephalus. There is a CSF attenuation region in the posterior fossa in the midline, possible arachnoid cyst. Vascular: No hyperdense vessel or unexpected calcification. Skull: Normal. Negative for fracture or focal lesion. Sinuses/Orbits: No acute finding. Other: None. IMPRESSION: No acute intracranial process. Electronically Signed   By: Brett Fairy M.D.   On: 04/03/2021 20:03     EKG: Independently reviewed, with result as described above.    Assessment/Plan   Principal Problem:   Seizures (HCC) Active Problems:   Chronic leukopenia   Hypertension   Depression   Tobacco abuse     #) Generalized tonic-clonic seizure: suspected episode of such occurring today, with ensuing e/o postictal state that appears have resolved, with mental status now back to baseline, with this suspected seizure episode having resolved spontaneously within 2-3 minutes of onset, without interval pharmacologic intervention to induce termination.  No evidence to suggest status epilepticus at this time.   This is all in the context of no known prior history of seizures, and unclear underlying source at this time, altough potential seizure lowering threshold via home zofoft is noted. additional preceding trauma in form of mechanical glf requesting in pt hitting her head w/o LOC severeal months ago is also noted, with CT head today showing no acute process including no e/o bleed. Considered dextromethoraphan as potential contributor, although pt conveys not recent use of this prn med.   In terms of evaluating for underlying source, pt does not appear to  have a history of diabetes, is not on insulin or sulfonylurea as outpatient, and initial labs show no e/o hypoglycemia, although will continue closely monitor ensuing blood sugar, as outline below. No evidence of hyponatremia.  Add on serum Mg level to assess for hypomagnesemia. renal function appears to be at baseline. No overt e/o underlying infxn at this time. Covid negative.   No reported h/o recreational drug use to increase suspicion for contributory heroin withdrawal or stimulant use at this time. Will check uds and add on serum ethanol level. presentation not associated with any known acute focal neuro deficits.  Of note, the patient conveys some recent,  new jaw pain with chewing. Not a/w change in vision. No h/o vasculiits and presentation appears atypical for giant cell arteritis.Marland Kitchen will check esr/crp, and tx at reisdual discomfort with prn tylenol.   d/w on-call neuro, Dr. Erlinda Hong, who will formally consutl and recommneded obs to hopsllitatis service for further eva/ management of new onset seizuress, including reocmmending mri brain and eeg in the AM.      Plan: Seizure precautions. Prn IV Ativan for breakthrough seizure.  Monitor on telemetry. continuous pulse-ox. Serial neuro checks. CMP/CBC in the AM. Check serum Mg level.  Add on serum ethanol level. Check UDS.  Q4 hour accu-checks to evaluate for any evidence of contributory hypoglycemia. Neurology consulted, as above. Crp, esr. Per neuro, MRI brain/ eeg ordered. Check prolactin.      #) chronic leukopenia: per chart review, since 2008, all recordered wbc data pts taht I have encourntered for this pt fit within the range of 2.1 - 3.7. repeating cbc conveys wbc consistent with this baseline range. Source unclear to me at this time. Hgb at baselnie. Platelets wnl.   Plan: cbc in AM. Check INR.        #) depresion : on zoloft as outpatient.   Plan: hold home zolfot for now in setting of presetnign suspected seizure and this  medication's potential for lowering seizure threshold.              #) Essential Hypertension: documented h/o such, with outpatient antihypertensive regimen including norvasc.  SBP's in the ED today: 120's to 140's mmHG  Plan: Close monitoring of subsequent BP via routine VS. Continue home norvasc.       #) chonric tobacco abuse: currnet smoker, have smokied approx 0.5 ppd over the last 40 years.   Plan: consulved on importantce of complete smking disoncintuation. Prn nicotine patch orderecd by me for use during this hospitalization.       DVT prophylaxis: SCD's   Code Status: Full code Family Communication:case d/w with pt's husband who was present at bedisde.  Disposition Plan: Per Rounding Team Consults called: Dr. Erlinda Hong of neuro formally conzsutled, as above;  Admission status: obs; med tele.    PLEASE NOTE THAT DRAGON DICTATION SOFTWARE WAS USED IN THE CONSTRUCTION OF THIS NOTE.   Allen DO [facility]  From [place]   04/03/2021, 10:05 PM

## 2021-04-03 NOTE — ED Provider Notes (Addendum)
[facility] EMERGENCY DEPARTMENT Provider Note   CSN: [id number] Arrival date & time: 04/03/21  1736     History Chief Complaint  Patient presents with   Seizures    Martha Schneider is a 55 y.o. female.  Patient is a 55 year old female with a history of hypertension presenting today after first-time seizure. Pt's husband gives some of the history and reports he was talking to his wife and she was completely normal and then suddenly he asked her a question  and she suddenly started saying "I don't know" and then dropped the phone.  Pt was seen by a co-worker in her car shaking all over and she still had generalized shaking when EMS arrived but resolved without intervention.  Pt does report a fall 2 months ago where she hit her head but never was checked.  She said she has been having morning headaches for the last 2 weeks but denies any headache here.  Patient denies any chest pain, shortness of breath, recent URI symptoms, fever.  She was feeling her normal self this morning except for the headache that she had but it did already resolved.  The history is provided by the patient and the EMS personnel.  Seizures Seizure activity on arrival: no   Seizure type:  Grand mal Preceding symptoms comment:  Pt can't remember anything Initial focality:  Unable to specify Episode characteristics: generalized shaking and unresponsiveness   Episode characteristics: no tongue biting   Postictal symptoms: confusion   Return to baseline: yes   Severity:  Severe Timing:  Once Progression:  Resolved Context: previous head injury   Context: not alcohol withdrawal, not drug use, not emotional upset, not family hx of seizures, not possible hypoglycemia and not possible medication ingestion   Context comment:  Reports that she has been working more but she is still getting at least 5 hours of sleep per night Recent head injury: Approximately 2 months ago she slipped and fell hitting the  front of her head while she was rushing but denies loss of consciousness and did not have it evaluated. PTA treatment:  None History of seizures: no       Past Medical History:  Diagnosis Date   Hypertension    Sarcoidosis     Patient Active Problem List   Diagnosis Date Noted   Dyspnea on exertion 01/13/2020   Current every day smoker 09/25/2019   Neutropenia (HCC) 09/25/2019    No past surgical history on file.   OB History   No obstetric history on file.     Family History  Problem Relation Age of Onset   Heart attack Mother    Liver cancer Father    Diabetes Mellitus II Sister    Kidney disease Sister     Social History   Tobacco Use   Smoking status: Every Day    Packs/day: 0.50    Years: 37.00    Pack years: 18.50    Types: Cigarettes   Smokeless tobacco: Never  Vaping Use   Vaping Use: Never used  Substance Use Topics   Alcohol use: Yes    Comment: occasionally   Drug use: No    Home Medications Prior to Admission medications   Medication Sig Start Date End Date Taking? Authorizing Provider  amLODipine (NORVASC) 5 MG tablet Take 5 mg by mouth daily. 12/16/19   [provider]  aspirin 325 MG tablet Take 325 mg by mouth daily.    [provider]  dextromethorphan-guaiFENesin (MUCINEX DM) 30-600 MG per 12 hr tablet Take 1 tablet by mouth 2 (two) times daily as needed for cough.    [provider]  predniSONE (DELTASONE) 20 MG tablet Take 1 tablet (20 mg total) by mouth daily with breakfast. 01/12/20   Leslye Peer, MD  sertraline (ZOLOFT) 100 MG tablet Take 100 mg by mouth daily. 12/16/19   [provider]    Allergies    Patient has no known allergies.  Review of Systems   Review of Systems  Neurological:  Positive for seizures.  All other systems reviewed and are negative.  Physical Exam Updated Vital Signs BP 140/89   Pulse 83   Temp 98.8 F (37.1 C) (Oral)   Resp 17   Ht 5\' 4"  (1.626 m)   Wt 54.4  kg   SpO2 96%   BMI 20.60 kg/m   Physical Exam Vitals and nursing note reviewed.  Constitutional:      General: She is not in acute distress.    Appearance: Normal appearance. She is well-developed.  HENT:     Head: Normocephalic and atraumatic.  Eyes:     Pupils: Pupils are equal, round, and reactive to light.  Cardiovascular:     Rate and Rhythm: Normal rate and regular rhythm.     Heart sounds: Normal heart sounds. No murmur heard.   No friction rub.  Pulmonary:     Effort: Pulmonary effort is normal.     Breath sounds: Normal breath sounds. No wheezing or rales.  Abdominal:     General: Bowel sounds are normal. There is no distension.     Palpations: Abdomen is soft.     Tenderness: There is no abdominal tenderness. There is no guarding or rebound.  Musculoskeletal:        General: No tenderness. Normal range of motion.     Comments: No edema  Skin:    General: Skin is warm and dry.     Findings: No rash.  Neurological:     General: No focal deficit present.     Mental Status: She is alert and oriented to person, place, and time. Mental status is at baseline.     Cranial Nerves: No cranial nerve deficit.     Sensory: No sensory deficit.     Motor: No weakness or pronator drift.     Coordination: Coordination normal. Finger-Nose-Finger Test and Heel to Shin Test normal.     Gait: Gait normal.  Psychiatric:        Behavior: Behavior normal.    ED Results / Procedures / Treatments   Labs (all labs ordered are listed, but only abnormal results are displayed) Labs Reviewed  CBC WITH DIFFERENTIAL/PLATELET - Abnormal; Notable for the following components:      Result Value   WBC 2.7 (*)    Hemoglobin 11.7 (*)    Neutro Abs 1.6 (*)    Lymphs Abs 0.6 (*)    All other components within normal limits  COMPREHENSIVE METABOLIC PANEL - Abnormal; Notable for the following components:   Glucose, Bld 107 (*)    All other components within normal limits  RAPID URINE DRUG  SCREEN, HOSP PERFORMED    EKG EKG Interpretation  Date/Time:  Tuesday April 03 2021 17:50:08 EST Ventricular Rate:  87 PR Interval:  244 QRS Duration: 93 QT Interval:  381 QTC Calculation: 459 R Axis:   -31 Text Interpretation: Sinus rhythm Prolonged PR interval Left axis deviation Low voltage, extremity leads  No previous tracing Confirmed by Gwyneth Sprout ([id number]) on 04/03/2021 6:56:12 PM  Radiology CT Head Wo Contrast  Result Date: 04/03/2021 CLINICAL DATA:  Seizure, nontraumatic. EXAM: CT HEAD WITHOUT CONTRAST TECHNIQUE: Contiguous axial images were obtained from the base of the skull through the vertex without intravenous contrast. COMPARISON:  None. FINDINGS: Brain: No acute intracranial hemorrhage, midline shift or mass effect. No extra-axial fluid collection is identified. Gray-white matter differentiation is within normal limits and there is no hydrocephalus. There is a CSF attenuation region in the posterior fossa in the midline, possible arachnoid cyst. Vascular: No hyperdense vessel or unexpected calcification. Skull: Normal. Negative for fracture or focal lesion. Sinuses/Orbits: No acute finding. Other: None. IMPRESSION: No acute intracranial process. Electronically Signed   By: Thornell Sartorius M.D.   On: 04/03/2021 20:03    Procedures Procedures   Medications Ordered in ED Medications  acetaminophen (TYLENOL) tablet 1,000 mg (has no administration in time range)    ED Course  I have reviewed the triage vital signs and the nursing notes.  Pertinent labs & imaging results that were available during my care of the patient were reviewed by me and considered in my medical decision making (see chart for details).    MDM Rules/Calculators/A&P                           Patient presenting today with new onset seizure prior to arrival.  Has no prior history of seizures and currently is awake and alert with no focal findings.  She does not use drugs or alcohol.  She is not  on any medications that would cause seizure as a side effect or withdrawal.  She does report a fall 2 months ago and in the last few weeks she has been having morning headaches but denies any headaches currently.  She does not take any anticoagulation. Will evaluate blood work and do a CT of the head to ensure no evidence of bleeding giving the history of her head trauma 2 months ago.  8:54 PM Pt's labs are reassuring.  CT neg.  EKG wnl. Discussed with Dr. Roda Shutters who will speak with the patient about inpt obs vs outpt work up.  9:28 PM Pt will be admitted for expedited work up.  MDM   Amount and/or Complexity of Data Reviewed Clinical lab tests: ordered and reviewed Tests in the radiology section of CPT: ordered and reviewed Tests in the medicine section of CPT: ordered and reviewed Independent visualization of images, tracings, or specimens: yes     Final Clinical Impression(s) / ED Diagnoses Final diagnoses:  New onset seizure [facility])    Rx / DC Orders ED Discharge Orders     None        Gwyneth Sprout, MD 04/03/21 2055    Gwyneth Sprout, MD 04/03/21 2128

## 2021-04-03 NOTE — ED Notes (Signed)
Ambulated to restroom with spouse

## 2021-04-03 NOTE — ED Notes (Signed)
Admit provider at bedside at this time 

## 2021-04-04 ENCOUNTER — Observation Stay (HOSPITAL_COMMUNITY): Payer: BC Managed Care – PPO

## 2021-04-04 DIAGNOSIS — F32A Depression, unspecified: Secondary | ICD-10-CM | POA: Diagnosis present

## 2021-04-04 DIAGNOSIS — Z720 Tobacco use: Secondary | ICD-10-CM | POA: Diagnosis present

## 2021-04-04 DIAGNOSIS — D72819 Decreased white blood cell count, unspecified: Secondary | ICD-10-CM | POA: Diagnosis present

## 2021-04-04 DIAGNOSIS — R569 Unspecified convulsions: Secondary | ICD-10-CM | POA: Diagnosis not present

## 2021-04-04 DIAGNOSIS — I10 Essential (primary) hypertension: Secondary | ICD-10-CM | POA: Diagnosis present

## 2021-04-04 LAB — COMPREHENSIVE METABOLIC PANEL
ALT: 13 U/L (ref 0–44)
AST: 26 U/L (ref 15–41)
Albumin: 3.6 g/dL (ref 3.5–5.0)
Alkaline Phosphatase: 56 U/L (ref 38–126)
Anion gap: 9 (ref 5–15)
BUN: 9 mg/dL (ref 6–20)
CO2: 27 mmol/L (ref 22–32)
Calcium: 8.8 mg/dL — ABNORMAL LOW (ref 8.9–10.3)
Chloride: 99 mmol/L (ref 98–111)
Creatinine, Ser: 0.63 mg/dL (ref 0.44–1.00)
GFR, Estimated: >60 mL/min (ref >60)
Glucose, Bld: 82 mg/dL (ref 70–99)
Potassium: 3.2 mmol/L — ABNORMAL LOW (ref 3.5–5.1)
Sodium: 135 mmol/L (ref 135–145)
Total Bilirubin: 0.8 mg/dL (ref 0.3–1.2)
Total Protein: 7.1 g/dL (ref 6.5–8.1)

## 2021-04-04 LAB — SEDIMENTATION RATE: Sed Rate: 16 mm/hr (ref 0–22)

## 2021-04-04 LAB — C-REACTIVE PROTEIN: CRP: <0.5 mg/dL (ref <1.0)

## 2021-04-04 LAB — PROTIME-INR
INR: 1.1 (ref 0.8–1.2)
Prothrombin Time: 14 seconds (ref 11.4–15.2)

## 2021-04-04 LAB — CBC WITH DIFFERENTIAL/PLATELET
Abs Immature Granulocytes: 0 10*3/uL (ref 0.00–0.07)
Basophils Absolute: 0 10*3/uL (ref 0.0–0.1)
Basophils Relative: 0 %
Eosinophils Absolute: 0 10*3/uL (ref 0.0–0.5)
Eosinophils Relative: 1 %
HCT: 36.5 % (ref 36.0–46.0)
Hemoglobin: 12.3 g/dL (ref 12.0–15.0)
Immature Granulocytes: 0 %
Lymphocytes Relative: 34 %
Lymphs Abs: 0.8 10*3/uL (ref 0.7–4.0)
MCH: 29.5 pg (ref 26.0–34.0)
MCHC: 33.7 g/dL (ref 30.0–36.0)
MCV: 87.5 fL (ref 80.0–100.0)
Monocytes Absolute: 0.5 10*3/uL (ref 0.1–1.0)
Monocytes Relative: 22 %
Neutro Abs: 1.1 10*3/uL — ABNORMAL LOW (ref 1.7–7.7)
Neutrophils Relative %: 43 %
Platelets: 210 10*3/uL (ref 150–400)
RBC: 4.17 MIL/uL (ref 3.87–5.11)
RDW: 13.7 % (ref 11.5–15.5)
WBC: 2.4 10*3/uL — ABNORMAL LOW (ref 4.0–10.5)
nRBC: 0 % (ref 0.0–0.2)

## 2021-04-04 LAB — CBG MONITORING, ED: Glucose-Capillary: 98 mg/dL (ref 70–99)

## 2021-04-04 LAB — HIV ANTIBODY (ROUTINE TESTING W REFLEX): HIV Screen 4th Generation wRfx: NONREACTIVE

## 2021-04-04 LAB — MAGNESIUM: Magnesium: 2 mg/dL (ref 1.7–2.4)

## 2021-04-04 IMAGING — MR MR HEAD WO/W CM
22 of 33 series · 30 of 48 positions shown · IV contrast (Gadavist)
Comparison: None.

CLINICAL DATA: Seizure

EXAM:
MRI HEAD WITHOUT AND WITH CONTRAST
TECHNIQUE: Multiplanar, multiecho pulse sequences of the brain and surrounding
structures were obtained without and with intravenous contrast.
CONTRAST:  5mL GADAVIST GADOBUTROL 1 MMOL/ML IV SOLN

[Series 9: DWI · axial · 3.0mm · 0.92mm/px · z∈[-61,+103]mm · 2 of 111 slices shown (1 of 4)]
[im 1/111]
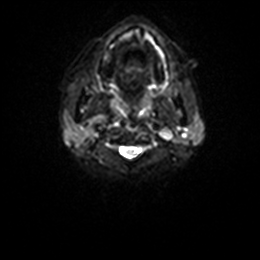
[im 111/111]
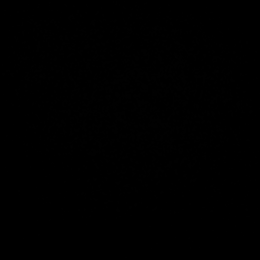

[Series 10: DWI · axial · 3.0mm · 0.92mm/px · 1 of 53 slices shown (2 of 4)]
[im 1/53]
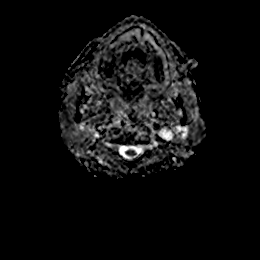

[Series 11: DWI · coronal · 4.0mm · 0.88mm/px · 1 of 80 slices shown (3 of 4)]
[im 1/80]
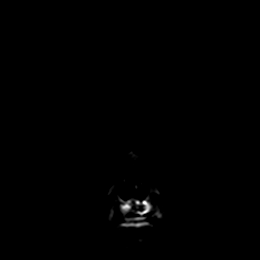

[Series 12: DWI · coronal · 4.0mm · 0.88mm/px · 1 of 40 slices shown (4 of 4)]
[im 1/40]
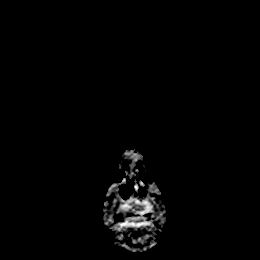

[Series 13: T1 · sagittal · 5.0mm · 0.75mm/px · 1 of 27 slices shown (1 of 2)]
[im 1/27]
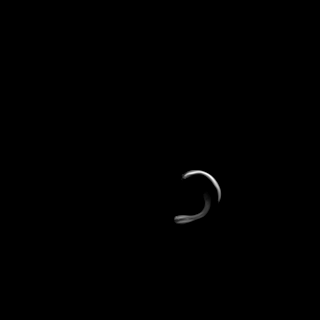

[Series 14: T2 · axial · 5.0mm · 0.75mm/px · 1 of 28 slices shown (1 of 4)]
[im 1/28]
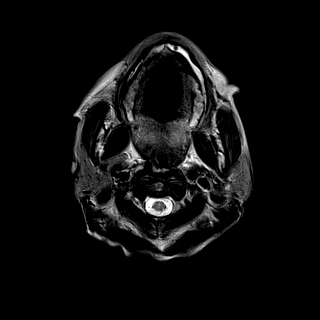

[Series 15: FLAIR · axial · 5.0mm · 0.47mm/px · 1 of 28 slices shown (1 of 2)]
[im 1/28]
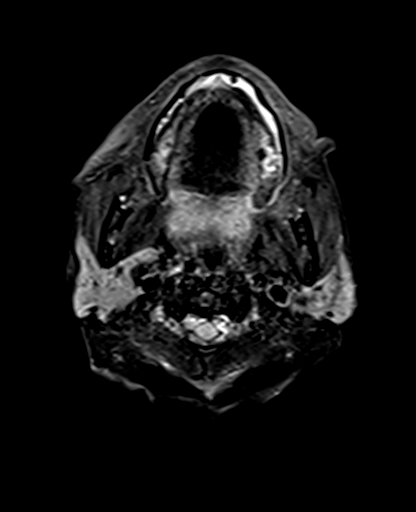

[Series 16: mag_images · axial · 3.0mm · 0.94mm/px · 1 of 56 slices shown]
[im 1/56]
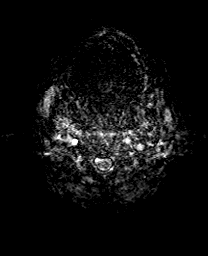

[Series 17: pha_images · axial · 3.0mm · 0.94mm/px · 1 of 56 slices shown]
[im 1/56]
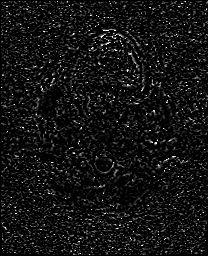

[Series 18: swi_images · axial · 3.0mm · 0.94mm/px · 1 of 56 slices shown]
[im 1/56]
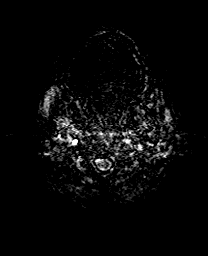

[Series 19: mip_images(sw) · axial · 24.0mm · 0.94mm/px · 1 of 49 slices shown]
[im 1/49]
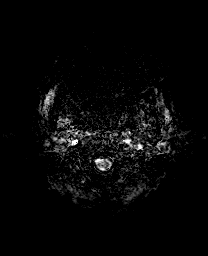

[Series 21: t1_mprage_tra_p2_iso · axial · 1.0mm · 0.98mm/px · z∈[-61,+96]mm · 5 of 160 slices shown]
[im 1/160]
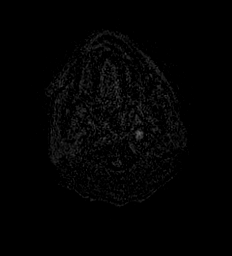
[im 40/160]
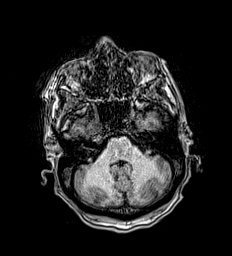
[im 80/160]
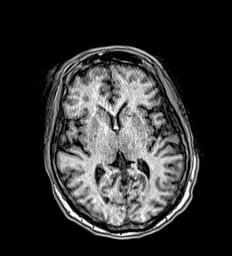
[im 120/160]
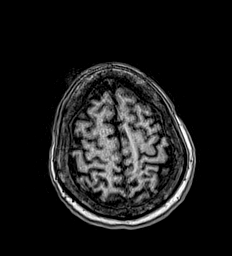
[im 160/160]
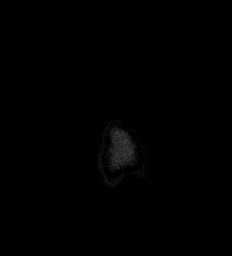

[Series 22: t1_mprage_tra_p2_iso_mpr_coronal · coronal · 1.0mm · 0.45mm/px · 4 of 128 slices shown]
[im 1/128]
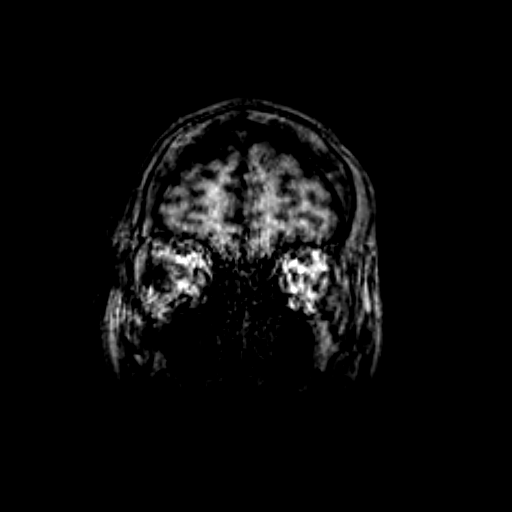
[im 43/128]
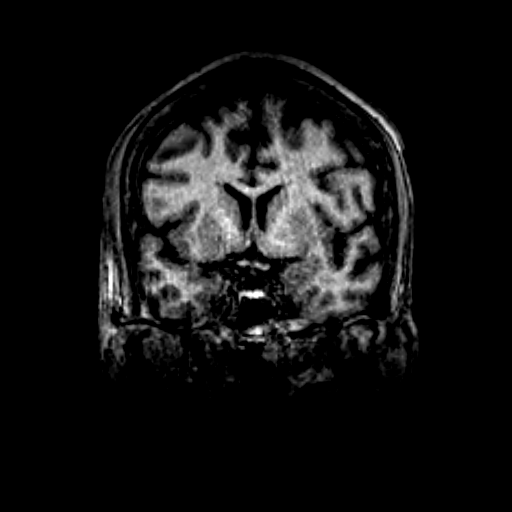
[im 85/128]
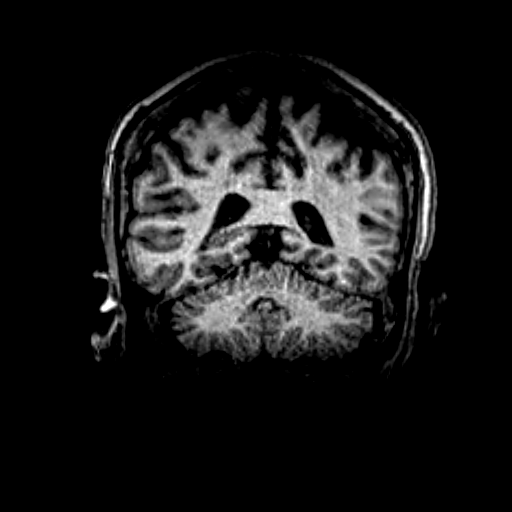
[im 128/128]
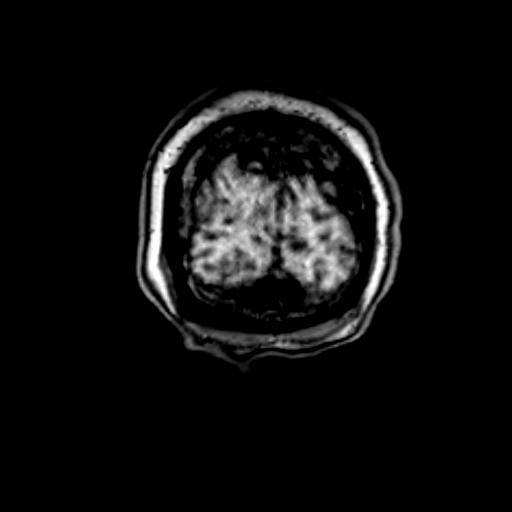

[Series 23: T2 · coronal · 3.0mm · 0.30mm/px · 1 of 43 slices shown (2 of 4)]
[im 1/43]
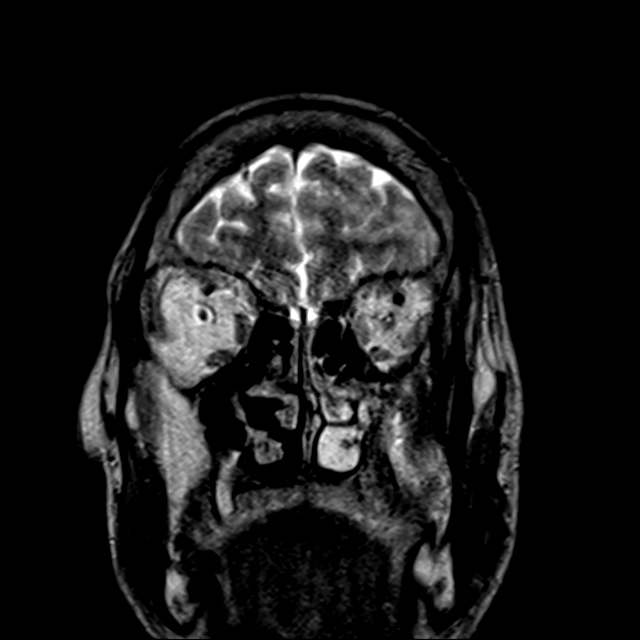

[Series 24: FLAIR · coronal · 3.0mm · 0.56mm/px · 1 of 21 slices shown (2 of 2)]
[im 1/21]
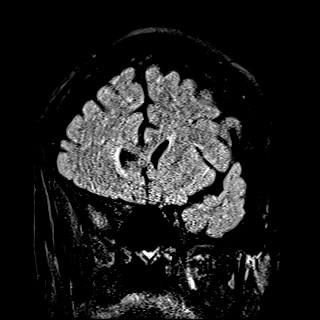

[Series 25: T1 · sagittal · 5.0mm · 0.75mm/px · 1 of 27 slices shown (2 of 2)]
[im 1/27]
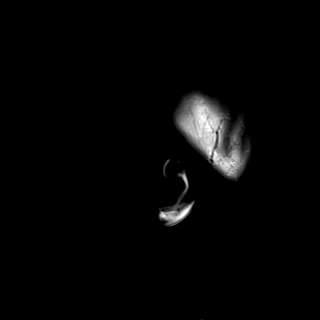

[Series 26: T2 · coronal · 3.0mm · 0.30mm/px · 1 of 43 slices shown (3 of 4)]
[im 1/43]
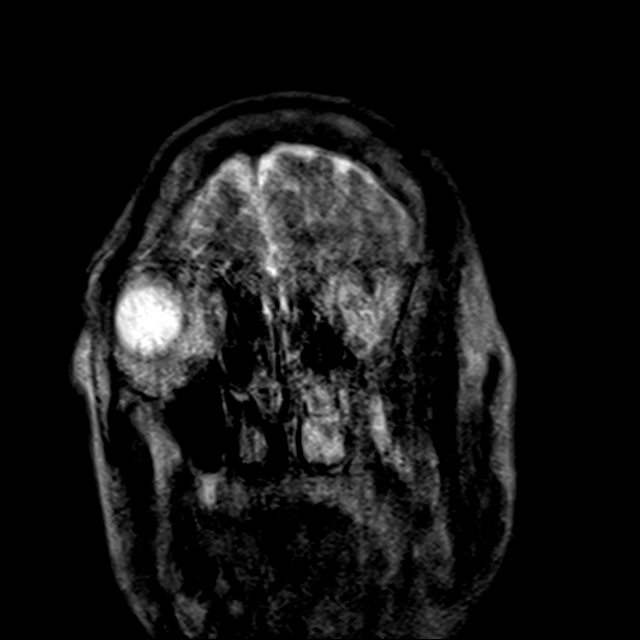

[Series 28: T2 · coronal · 3.0mm · 0.30mm/px · 1 of 43 slices shown (4 of 4)]
[im 1/43]
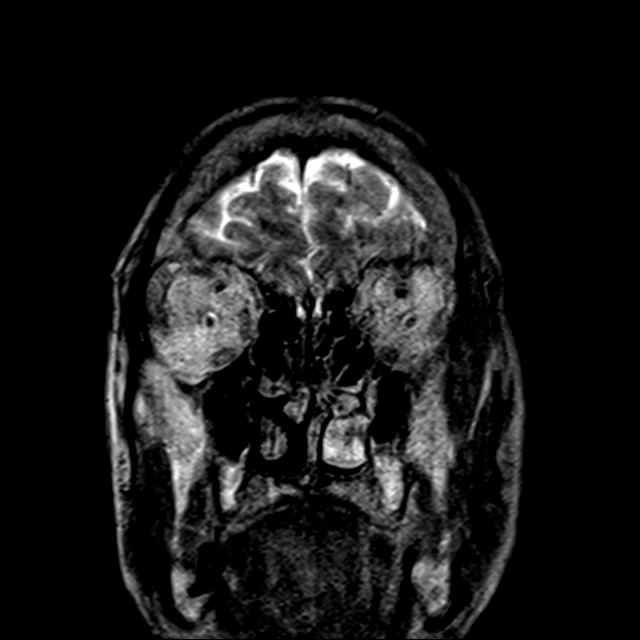

[Series 29: T2 post-contrast · coronal · 5.0mm · 0.72mm/px · 1 of 32 slices shown]
[im 1/32]
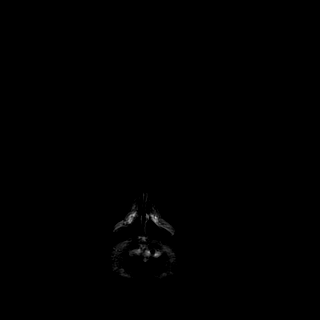

[Series 31: T1 post-contrast · coronal · 5.0mm · 0.34mm/px · 1 of 32 slices shown (1 of 3)]
[im 1/32]
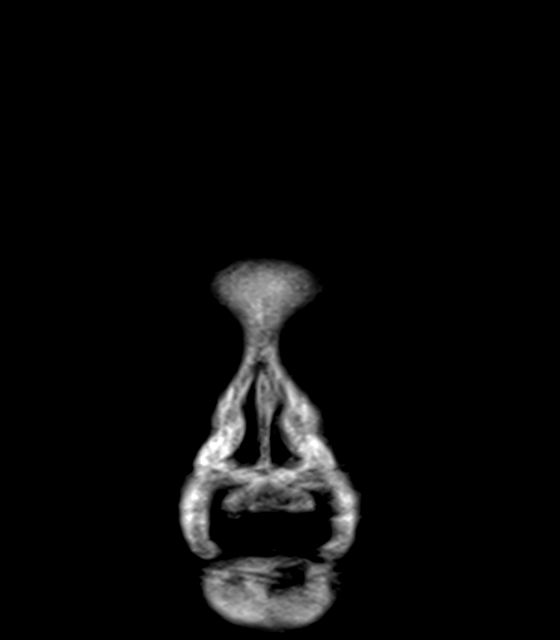

[Series 32: T1 post-contrast · sagittal · 5.0mm · 0.72mm/px · 1 of 29 slices shown (2 of 3)]
[im 1/29]
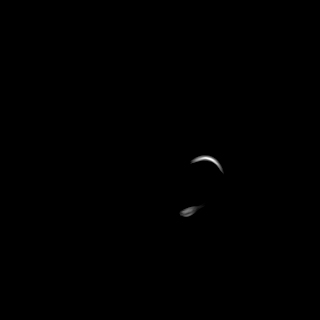

[Series 33: T1 post-contrast · coronal · 5.0mm · 0.34mm/px · 1 of 32 slices shown (3 of 3)]
[im 1/32]
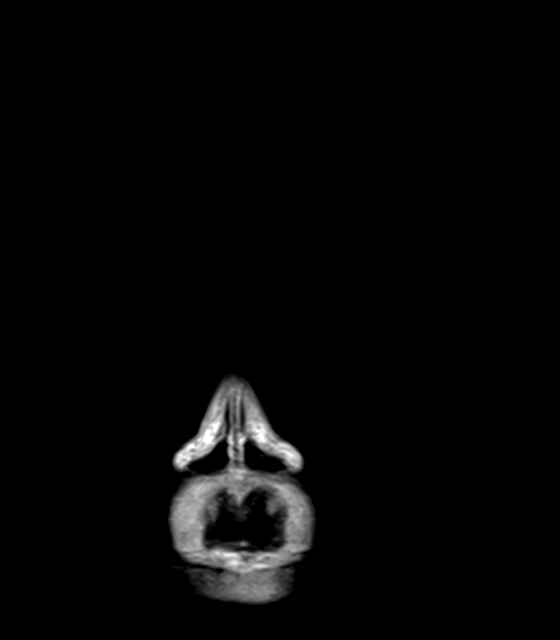

[30 of 48 positions shown; findings below may reference images not displayed]

FINDINGS: Brain: No acute infarct, mass effect or extra-axial collection.
Chronic microhemorrhage in the left pons. There is a cluster of
serpiginous contrast enhancement at the left frontal pole. There is
multifocal hyperintense T2-weighted signal within the white matter.
Parenchymal volume and CSF spaces are normal. The midline structures
are normal.

Vascular: Major flow voids are preserved.

Skull and upper cervical spine: Normal calvarium and skull base.
Visualized upper cervical spine and soft tissues are normal.

Sinuses/Orbits:No paranasal sinus fluid levels or advanced mucosal
thickening. No mastoid or middle ear effusion. Normal orbits.
IMPRESSION: 1. No acute intracranial abnormality.
2. Cluster of serpiginous contrast enhancement at the left frontal
pole may be a small vascular malformation. CTA of the head may be
helpful for further characterization.
3. Findings of chronic microvascular disease.

## 2021-04-04 MED ORDER — POTASSIUM CHLORIDE CRYS ER 20 MEQ PO TBCR
40.0000 meq | EXTENDED_RELEASE_TABLET | Freq: Once | ORAL | Status: AC
  Administered 2021-04-04: 40 meq via ORAL
  Filled 2021-04-04: qty 2

## 2021-04-04 MED ORDER — GADOBUTROL 1 MMOL/ML IV SOLN
5.0000 mL | Freq: Once | INTRAVENOUS | Status: AC | PRN
  Administered 2021-04-04: 5 mL via INTRAVENOUS

## 2021-04-04 MED ORDER — NICOTINE 14 MG/24HR TD PT24: 14.0000 mg | MEDICATED_PATCH | Freq: Every day | TRANSDERMAL | 0 refills | Status: DC | PRN

## 2021-04-04 MED ORDER — LEVETIRACETAM 500 MG PO TABS: 500.0000 mg | ORAL_TABLET | Freq: Two times a day (BID) | ORAL | 0 refills | Status: DC

## 2021-04-04 MED ORDER — LEVETIRACETAM 500 MG PO TABS
500.0000 mg | ORAL_TABLET | Freq: Two times a day (BID) | ORAL | Status: DC
  Administered 2021-04-04: 500 mg via ORAL
  Filled 2021-04-04: qty 1

## 2021-04-04 MED ORDER — NICOTINE 14 MG/24HR TD PT24
14.0000 mg | MEDICATED_PATCH | Freq: Every day | TRANSDERMAL | Status: DC | PRN
  Administered 2021-04-04: 14 mg via TRANSDERMAL
  Filled 2021-04-04: qty 1

## 2021-04-04 NOTE — Progress Notes (Signed)
EEG complete - results pending 

## 2021-04-04 NOTE — ED Notes (Signed)
Patient is resting comfortably. 

## 2021-04-04 NOTE — ED Notes (Signed)
Pt to EEG.

## 2021-04-04 NOTE — Procedures (Signed)
Patient Name: Martha Schneider  MRN: [id number]  Epilepsy Attending: Charlsie Quest  Referring Physician/Provider: Dr Marvel Plan Date: 04/04/2021 Duration: 31.10 mins  Patient history: 55 year old female with new onset seizure.  EEG to evaluate for seizure.  Level of alertness: Awake, asleep  AEDs during EEG study: LEV, ativan  Technical aspects: This EEG study was done with scalp electrodes positioned according to the 10-20 International system of electrode placement. Electrical activity was acquired at a sampling rate of 500Hz  and reviewed with a high frequency filter of 70Hz  and a low frequency filter of 1Hz . EEG data were recorded continuously and digitally stored.   Description: The posterior dominant rhythm consists of 9-10 Hz activity of moderate voltage (25-35 uV) seen predominantly in posterior head regions, symmetric and reactive to eye opening and eye closing. Sleep was characterized by vertex waves, sleep spindles (12 to 14 Hz), maximal frontocentral region.  Hyperventilation did not show any EEG change. Hyperventilation and photic stimulation were not performed.     IMPRESSION: This study is within normal limits. No seizures or epileptiform discharges were seen throughout the recording.  Nil Bolser 

## 2021-04-04 NOTE — ED Notes (Signed)
Breakfast orders placed 

## 2021-04-04 NOTE — ED Notes (Signed)
Patient is resting comfortably.  Back from MRI and in NAD. Husband at [facility]

## 2021-04-04 NOTE — ED Notes (Signed)
Pt verbalized understanding of d/c instructions, meds and followup care. Denies questions. VSS, no distress noted. Pt and husband verbalize understanding that pt cannot drive or operate machinery until seizure free, and need to follow up neurology.Steady gait to exit with all belongings.

## 2021-04-04 NOTE — ED Notes (Signed)
Called MRI and advised to bring pt to H21 when completed

## 2021-04-04 NOTE — Discharge Instructions (Signed)
Follow with Docia Chuck, Dibas, MD in 5-7 days  Please get a complete blood count and chemistry panel checked by your Primary MD at your next visit, and again as instructed by your Primary MD. Please get your medications reviewed and adjusted by your Primary MD.  Please request your Primary MD to go over all Hospital Tests and Procedure/Radiological results at the follow up, please get all Hospital records sent to your Prim MD by signing hospital release before you go home.  In some cases, there will be blood work, cultures and biopsy results pending at the time of your discharge. Please request that your primary care M.D. goes through all the records of your hospital data and follows up on these results.  If you had Pneumonia of Lung problems at the Hospital: Please get a 2 view Chest X ray done in 6-8 weeks after hospital discharge or sooner if instructed by your Primary MD.  If you have Congestive Heart Failure: Please call your Cardiologist or Primary MD anytime you have any of the following symptoms:  1) 3 pound weight gain in 24 hours or 5 pounds in 1 week  2) shortness of breath, with or without a dry hacking cough  3) swelling in the hands, feet or stomach  4) if you have to sleep on extra pillows at night in order to breathe  Follow cardiac low salt diet and 1.5 lit/day fluid restriction.  If you have diabetes Accuchecks 4 times/day, Once in AM empty stomach and then before each meal. Log in all results and show them to your primary doctor at your next visit. If any glucose reading is under 80 or above 300 call your primary MD immediately.  If you have Seizure/Convulsions/Epilepsy: Please do not drive, operate heavy machinery, participate in activities at heights or participate in high speed sports until you have seen by Primary MD or a Neurologist and advised to do so again. Per [facility] statutes, patients with seizures are not allowed to drive until they have been  seizure-free for six months.  Use caution when using heavy equipment or power tools. Avoid working on ladders or at heights. Take showers instead of baths. Ensure the water temperature is not too high on the home water heater. Do not go swimming alone. Do not lock yourself in a room alone (i.e. bathroom). When caring for infants or small children, sit down when holding, feeding, or changing them to minimize risk of injury to the child in the event you have a seizure. Maintain good sleep hygiene. Avoid alcohol.   If you had Gastrointestinal Bleeding: Please ask your Primary MD to check a complete blood count within one week of discharge or at your next visit. Your endoscopic/colonoscopic biopsies that are pending at the time of discharge, will also need to followed by your Primary MD.  Get Medicines reviewed and adjusted. Please take all your medications with you for your next visit with your Primary MD  Please request your Primary MD to go over all hospital tests and procedure/radiological results at the follow up, please ask your Primary MD to get all Hospital records sent to his/her office.  If you experience worsening of your admission symptoms, develop shortness of breath, life threatening emergency, suicidal or homicidal thoughts you must seek medical attention immediately by calling 911 or calling your MD immediately  if symptoms less severe.  You must read complete instructions/literature along with all the possible adverse reactions/side effects for all the Medicines you take  and that have been prescribed to you. Take any new Medicines after you have completely understood and accpet all the possible adverse reactions/side effects.   Do not drive or operate heavy machinery when taking Pain medications.   Do not take more than prescribed Pain, Sleep and Anxiety Medications  Special Instructions: If you have smoked or chewed Tobacco  in the last 2 yrs please stop smoking, stop any regular  Alcohol  and or any Recreational drug use.  Wear Seat belts while driving.  Please note You were cared for by a hospitalist during your hospital stay. If you have any questions about your discharge medications or the care you received while you were in the hospital after you are discharged, you can call the unit and asked to speak with the hospitalist on call if the hospitalist that took care of you is not available. Once you are discharged, your primary care physician will handle any further medical issues. Please note that NO REFILLS for any discharge medications will be authorized once you are discharged, as it is imperative that you return to your primary care physician (or establish a relationship with a primary care physician if you do not have one) for your aftercare needs so that they can reassess your need for medications and monitor your lab values.  You can reach the hospitalist office at phone [phone] or fax [phone]   If you do not have a primary care physician, you can call [phone] for a physician referral.  Activity: As tolerated with Full fall precautions use walker/cane & assistance as needed    Diet: regular  Disposition Home

## 2021-04-04 NOTE — ED Notes (Signed)
Pt stated she did not know she was admitted. This RN gave update on POC and sent secure chat to Dr. Elvera Lennox asking that he come speak to pt and her husband. Pt was not on monitoring equipment at shift change; this RN obtained telemetry monitoring and applied to pt. Pt sitting up in stretcher eating breakfast. Husband at bedside. No distress noted, VSS, and pt denies further needs.

## 2021-04-04 NOTE — ED Notes (Signed)
T transported to MRI

## 2021-04-04 NOTE — Discharge Summary (Addendum)
Physician Discharge Summary  [name] [id number] DOB: 08-30-1965 DOA: 04/03/2021  PCP: Lujean Amel, MD  Admit date: 04/03/2021 Discharge date: 04/04/2021  Admitted From: home Disposition:  home  Recommendations for Outpatient Follow-up:  Follow up with PCP in 1-2 weeks Follow-up with neurology in 2 to 4 weeks Follow-up with neurosurgery in 2 to 4 weeks  Home Health: None Equipment/Devices: None  Discharge Condition: Stable CODE STATUS: Full code Diet recommendation: Heart healthy  HPI: Per admitting MD, Martha Schneider is a 55 y.o. female with medical history significant for essential htn, depression, sarcoidosis, chronic leukopenia, who is admitted to [facility] on 04/03/2021 with new onset seizure after presenting from home to [facility] ED via complaining of one episode of tonic-clonic activity. In setting of no prior h/o seizures, patient found to be co-worker to be exhibiting tonic-cloinci shaking involving all for extremities, while the patient was sitting in the driver seat of her parked car in company parking lot. She had gone outside to talk on phone with husband and was sitting in her car at the time. Husband said that , during their phone call, that patient started repeatign over and over "I doon't know", at least 10 times before silence ensued , and could tell that co-worker then found pt in the car without a few mins of ensuing phone silence. EMS then called, and shaking activity rewsulvoed spontaneously prior to their arrival , wioth ems subseuqently taking pt to [facility] ED for further eval. No tongue biting. No loss of bowel/ bladder fxn. No subsequent seizure liek acitivity. Brief peroid of diminisned responsive / confusion ensued for about 15-20 mintues following conclusion of reported seizure like activity. Mental status now back to baseline.  No any any anti-antileptic meds at home. No recent changes to home medication regimen. Denies any history of alcohol abuse or  routine/recent alcohol consumption.  No history of recreational drug use, including no abuse of stimulant class recreational drugs, including no use of cocaine, methamphetamine, or amphetamines. PRN dextromethoraphan listed in home med list, but patient denies any recent use of this. Denies any chronic or recent use of benzodiazepines, opioids,but notes use of zoloft on scheduled basis.   No history of diabetes and does not take insulin or sulfonylureas as an outpatient. Patient conveys that she incurred glf that was mechancial and occurred a few months ago, during which she hit her front portion of head on the floor, without LOC. Over the last 2 weeks she has been experineincg b/l front lobe headaches that are completely new for her. Not a/w auro. Otherwise, no recent trauma.  Today's presentation Not a/w any recent acute focal weakness, acute focal numbness, paresthesias, slurred speech, facial droop, vertigo, nausea, vomiting, or acute change in vision.  No recent reported neck stiffness, rash. No recent subjective fever, chills, rigors, or generalized myalgias. No recent sore throat, sob, wheezing, cough, abdominal pain, diarrhea.  denies any recent dysuria, gross hematuria, or change in urinary urgency/frequency.   Hospital Course / Discharge diagnoses: Principal problem Generalized tonic-clonic seizure -neurology consulted and followed patient while hospitalized.  This was followed by postictal episode which resolved spontaneously.  She underwent an MRI of the brain which was negative for acute intracranial abnormalities, but it did picked up a small vascular malformation at the left frontal pole.  Discussed with neurology, they do recommend Keppra 500 twice daily as well as outpatient follow-up with neurosurgery but no further inpatient work-up.  She has returned to baseline and will be discharged  home in stable condition.  She was advised not to drive for 6 months or cleared until cleared by  neurology/PCP  Active problems Chronic leukopenia-being followed as an outpatient by oncology Depression-continue home medications Essential hypertension-continue home medications Chronic tobacco use-counseled for cessation, nicotine patch prescribed EtOH use-every other day, 4-5 beers.  No withdrawals while here.   History of sarcoidosis-follows with pulmonology as an outpatient  Sepsis ruled out   Discharge Instructions  Discharge Instructions     Ambulatory referral to Neurology   Complete by: As directed    An appointment is requested in approximately: 2 weeks      Allergies as of 04/04/2021   No Known Allergies      Medication List     STOP taking these medications    predniSONE 20 MG tablet Commonly known as: Deltasone       TAKE these medications    amLODipine 10 MG tablet Commonly known as: NORVASC Take 10 mg by mouth daily. What changed: Another medication with the same name was removed. Continue taking this medication, and follow the directions you see here.   aspirin 325 MG tablet Take 325 mg by mouth daily.   dextromethorphan-guaiFENesin 30-600 MG 12hr tablet Commonly known as: MUCINEX DM Take 1 tablet by mouth 2 (two) times daily as needed for cough.   levETIRAcetam 500 MG tablet Commonly known as: KEPPRA Take 1 tablet (500 mg total) by mouth 2 (two) times daily.   nicotine 14 mg/24hr patch Commonly known as: NICODERM CQ - dosed in mg/24 hours Place 1 patch (14 mg total) onto the skin daily as needed (smoking cessation).   sertraline 100 MG tablet Commonly known as: ZOLOFT Take 100 mg by mouth daily.        Follow-up Information     Lisbeth Renshaw, MD Follow up in 1 month(s).   Specialty: Neurosurgery Why: Please call to schedule an appointment to see neurosurgery for your small vascular malformation seen on the MRI Contact information: [address] [place] [address] [phone]                  Consultations: Neurology   Procedures/Studies:  CT Head Wo Contrast  Result Date: 04/03/2021 CLINICAL DATA:  Seizure, nontraumatic. EXAM: CT HEAD WITHOUT CONTRAST TECHNIQUE: Contiguous axial images were obtained from the base of the skull through the vertex without intravenous contrast. COMPARISON:  None. FINDINGS: Brain: No acute intracranial hemorrhage, midline shift or mass effect. No extra-axial fluid collection is identified. Gray-white matter differentiation is within normal limits and there is no hydrocephalus. There is a CSF attenuation region in the posterior fossa in the midline, possible arachnoid cyst. Vascular: No hyperdense vessel or unexpected calcification. Skull: Normal. Negative for fracture or focal lesion. Sinuses/Orbits: No acute finding. Other: None. IMPRESSION: No acute intracranial process. Electronically Signed   By: Thornell Sartorius M.D.   On: 04/03/2021 20:03   MR BRAIN W WO CONTRAST  Result Date: 04/04/2021 CLINICAL DATA:  Seizure EXAM: MRI HEAD WITHOUT AND WITH CONTRAST TECHNIQUE: Multiplanar, multiecho pulse sequences of the brain and surrounding structures were obtained without and with intravenous contrast. CONTRAST:  13mL GADAVIST GADOBUTROL 1 MMOL/ML IV SOLN COMPARISON:  None. FINDINGS: Brain: No acute infarct, mass effect or extra-axial collection. Chronic microhemorrhage in the left pons. There is a cluster of serpiginous contrast enhancement at the left frontal pole. There is multifocal hyperintense T2-weighted signal within the white matter. Parenchymal volume and CSF spaces are normal. The midline structures  are normal. Vascular: Major flow voids are preserved. Skull and upper cervical spine: Normal calvarium and skull base. Visualized upper cervical spine and soft tissues are normal. Sinuses/Orbits:No paranasal sinus fluid levels or advanced mucosal thickening. No mastoid or middle ear effusion. Normal orbits. IMPRESSION: 1. No acute intracranial abnormality. 2.  Cluster of serpiginous contrast enhancement at the left frontal pole may be a small vascular malformation. CTA of the head may be helpful for further characterization. 3. Findings of chronic microvascular disease. Electronically Signed   By: Ulyses Jarred M.D.   On: 04/04/2021 02:43   EEG adult  Result Date: 04/04/2021 Lora Havens, MD     04/04/2021 10:58 AM Patient Name: Martha Schneider MRN: [id number] Epilepsy Attending: Lora Havens Referring Physician/Provider: Dr Rosalin Hawking Date: 04/04/2021 Duration: 31.10 mins Patient history: 55 year old female with new onset seizure.  EEG to evaluate for seizure. Level of alertness: Awake, asleep AEDs during EEG study: LEV, ativan Technical aspects: This EEG study was done with scalp electrodes positioned according to the 10-20 International system of electrode placement. Electrical activity was acquired at a sampling rate of 500Hz  and reviewed with a high frequency filter of 70Hz  and a low frequency filter of 1Hz . EEG data were recorded continuously and digitally stored. Description: The posterior dominant rhythm consists of 9-10 Hz activity of moderate voltage (25-35 uV) seen predominantly in posterior head regions, symmetric and reactive to eye opening and eye closing. Sleep was characterized by vertex waves, sleep spindles (12 to 14 Hz), maximal frontocentral region.  Hyperventilation did not show any EEG change. Hyperventilation and photic stimulation were not performed.   IMPRESSION: This study is within normal limits. No seizures or epileptiform discharges were seen throughout the recording. Priyanka Barbra Sarks     Subjective: - no chest pain, shortness of breath, no abdominal pain, nausea or vomiting.   Discharge Exam: BP 111/87 (BP Location: Right Arm)   Pulse 88   Temp 98.6 F (37 C) (Oral)   Resp 12   Ht 5\' 4"  (1.626 m)   Wt 54.4 kg   SpO2 98%   BMI 20.60 kg/m   General: Pt is alert, awake, not in acute distress Cardiovascular: RRR, S1/S2  +, no rubs, no gallops Respiratory: CTA bilaterally, no wheezing, no rhonchi Abdominal: Soft, NT, ND, bowel sounds + Extremities: no edema, no cyanosis    The results of significant diagnostics from this hospitalization (including imaging, microbiology, ancillary and laboratory) are listed below for reference.     Microbiology: Recent Results (from the past 240 hour(s))  Resp Panel by RT-PCR (Flu A&B, Covid) Nasopharyngeal Swab     Status: None   Collection Time: 04/03/21  9:28 PM   Specimen: Nasopharyngeal Swab; Nasopharyngeal(NP) swabs in vial transport medium  Result Value Ref Range Status   SARS Coronavirus 2 by RT PCR NEGATIVE NEGATIVE Final    Comment: (NOTE) SARS-CoV-2 target nucleic acids are NOT DETECTED.  The SARS-CoV-2 RNA is generally detectable in upper respiratory specimens during the acute phase of infection. The lowest concentration of SARS-CoV-2 viral copies this assay can detect is 138 copies/mL. A negative result does not preclude SARS-Cov-2 infection and should not be used as the sole basis for treatment or other patient management decisions. A negative result may occur with  improper specimen collection/handling, submission of specimen other than nasopharyngeal swab, presence of viral mutation(s) within the areas targeted by this assay, and inadequate number of viral copies(<138 copies/mL). A negative result must be combined with clinical observations,  patient history, and epidemiological information. The expected result is Negative.  Fact Sheet for Patients:  EntrepreneurPulse.com.au  Fact Sheet for Healthcare Providers:  IncredibleEmployment.be  This test is no t yet approved or cleared by the [place] FDA and  has been authorized for detection and/or diagnosis of SARS-CoV-2 by FDA under an Emergency Use Authorization (EUA). This EUA will remain  in effect (meaning this test can be used) for the duration of  the COVID-19 declaration under Section 564(b)(1) of the Act, 21 U.S.C.section 360bbb-3(b)(1), unless the authorization is terminated  or revoked sooner.       Influenza A by PCR NEGATIVE NEGATIVE Final   Influenza B by PCR NEGATIVE NEGATIVE Final    Comment: (NOTE) The Xpert Xpress SARS-CoV-2/FLU/RSV plus assay is intended as an aid in the diagnosis of influenza from Nasopharyngeal swab specimens and should not be used as a sole basis for treatment. Nasal washings and aspirates are unacceptable for Xpert Xpress SARS-CoV-2/FLU/RSV testing.  Fact Sheet for Patients: EntrepreneurPulse.com.au  Fact Sheet for Healthcare Providers: IncredibleEmployment.be  This test is not yet approved or cleared by the [place] FDA and has been authorized for detection and/or diagnosis of SARS-CoV-2 by FDA under an Emergency Use Authorization (EUA). This EUA will remain in effect (meaning this test can be used) for the duration of the COVID-19 declaration under Section 564(b)(1) of the Act, 21 U.S.C. section 360bbb-3(b)(1), unless the authorization is terminated or revoked.  Performed at [facility], [place] [address] [place], [place] [address]      Labs: Basic Metabolic Panel: Recent Labs  Lab 04/03/21 1820 04/04/21 0535  NA 138 135  K 3.7 3.2*  CL 101 99  CO2 28 27  GLUCOSE 107* 82  BUN 14 9  CREATININE 0.78 0.63  CALCIUM 9.1 8.8*  MG  --  2.0   Liver Function Tests: Recent Labs  Lab 04/03/21 1820 04/04/21 0535  AST 26 26  ALT 16 13  ALKPHOS 59 56  BILITOT 0.6 0.8  PROT 7.0 7.1  ALBUMIN 3.7 3.6   CBC: Recent Labs  Lab 04/03/21 1820 04/04/21 0535  WBC 2.7* 2.4*  NEUTROABS 1.6* 1.1*  HGB 11.7* 12.3  HCT 36.6 36.5  MCV 89.9 87.5  PLT 210 210   CBG: Recent Labs  Lab 04/04/21 0728  GLUCAP 98   Hgb A1c No results for input(s): HGBA1C in the last 72 hours. Lipid Profile No results for input(s): CHOL, HDL, LDLCALC,  TRIG, CHOLHDL, LDLDIRECT in the last 72 hours. Thyroid function studies No results for input(s): TSH, T4TOTAL, T3FREE, THYROIDAB in the last 72 hours.  Invalid input(s): FREET3 Urinalysis    Component Value Date/Time   BILIRUBINUR Negative 12/17/2013 1608   PROTEINUR Negaitve 12/17/2013 1608   UROBILINOGEN 0.2 12/17/2013 1608   NITRITE Negative 12/17/2013 1608   LEUKOCYTESUR Negative 12/17/2013 1608    FURTHER DISCHARGE INSTRUCTIONS:   Get Medicines reviewed and adjusted: Please take all your medications with you for your next visit with your Primary MD   Laboratory/radiological data: Please request your Primary MD to go over all hospital tests and procedure/radiological results at the follow up, please ask your Primary MD to get all Hospital records sent to his/her office.   In some cases, they will be blood work, cultures and biopsy results pending at the time of your discharge. Please request that your primary care M.D. goes through all the records of your hospital data and follows up on these results.   Also  Note the following: If you experience worsening of your admission symptoms, develop shortness of breath, life threatening emergency, suicidal or homicidal thoughts you must seek medical attention immediately by calling 911 or calling your MD immediately  if symptoms less severe.   You must read complete instructions/literature along with all the possible adverse reactions/side effects for all the Medicines you take and that have been prescribed to you. Take any new Medicines after you have completely understood and accpet all the possible adverse reactions/side effects.    Do not drive when taking Pain medications or sleeping medications (Benzodaizepines)   Do not take more than prescribed Pain, Sleep and Anxiety Medications. It is not advisable to combine anxiety,sleep and pain medications without talking with your primary care practitioner   Special Instructions: If you  have smoked or chewed Tobacco  in the last 2 yrs please stop smoking, stop any regular Alcohol  and or any Recreational drug use.   Wear Seat belts while driving.   Please note: You were cared for by a hospitalist during your hospital stay. Once you are discharged, your primary care physician will handle any further medical issues. Please note that NO REFILLS for any discharge medications will be authorized once you are discharged, as it is imperative that you return to your primary care physician (or establish a relationship with a primary care physician if you do not have one) for your post hospital discharge needs so that they can reassess your need for medications and monitor your lab values.  Time coordinating discharge: 40 minutes  SIGNED:  Marzetta Board, MD, PhD 04/04/2021, 11:10 AM

## 2021-04-08 ENCOUNTER — Inpatient Hospital Stay (HOSPITAL_COMMUNITY)
Admission: EM | Admit: 2021-04-08 | Discharge: 2021-04-11 | DRG: 101 | Disposition: A | Discharge status: Home / Self Care | Payer: BC Managed Care – PPO | Attending: Internal Medicine | Admitting: Internal Medicine

## 2021-04-08 ENCOUNTER — Emergency Department (HOSPITAL_COMMUNITY): Payer: BC Managed Care – PPO

## 2021-04-08 ENCOUNTER — Inpatient Hospital Stay (HOSPITAL_COMMUNITY): Payer: BC Managed Care – PPO

## 2021-04-08 DIAGNOSIS — F32A Depression, unspecified: Secondary | ICD-10-CM | POA: Diagnosis present

## 2021-04-08 DIAGNOSIS — Z800 Family history of malignant neoplasm of digestive organs: Secondary | ICD-10-CM | POA: Diagnosis not present

## 2021-04-08 DIAGNOSIS — D869 Sarcoidosis, unspecified: Secondary | ICD-10-CM | POA: Diagnosis present

## 2021-04-08 DIAGNOSIS — Z20822 Contact with and (suspected) exposure to covid-19: Secondary | ICD-10-CM | POA: Diagnosis present

## 2021-04-08 DIAGNOSIS — I440 Atrioventricular block, first degree: Secondary | ICD-10-CM | POA: Diagnosis present

## 2021-04-08 DIAGNOSIS — Z79899 Other long term (current) drug therapy: Secondary | ICD-10-CM | POA: Diagnosis not present

## 2021-04-08 DIAGNOSIS — Z833 Family history of diabetes mellitus: Secondary | ICD-10-CM | POA: Diagnosis not present

## 2021-04-08 DIAGNOSIS — G40909 Epilepsy, unspecified, not intractable, without status epilepticus: Secondary | ICD-10-CM | POA: Diagnosis not present

## 2021-04-08 DIAGNOSIS — Z720 Tobacco use: Secondary | ICD-10-CM | POA: Diagnosis present

## 2021-04-08 DIAGNOSIS — R410 Disorientation, unspecified: Secondary | ICD-10-CM | POA: Diagnosis not present

## 2021-04-08 DIAGNOSIS — Z841 Family history of disorders of kidney and ureter: Secondary | ICD-10-CM | POA: Diagnosis not present

## 2021-04-08 DIAGNOSIS — I10 Essential (primary) hypertension: Secondary | ICD-10-CM | POA: Diagnosis present

## 2021-04-08 DIAGNOSIS — Z8249 Family history of ischemic heart disease and other diseases of the circulatory system: Secondary | ICD-10-CM | POA: Diagnosis not present

## 2021-04-08 DIAGNOSIS — I160 Hypertensive urgency: Secondary | ICD-10-CM | POA: Diagnosis present

## 2021-04-08 DIAGNOSIS — D72819 Decreased white blood cell count, unspecified: Secondary | ICD-10-CM | POA: Diagnosis present

## 2021-04-08 DIAGNOSIS — F1010 Alcohol abuse, uncomplicated: Secondary | ICD-10-CM | POA: Diagnosis not present

## 2021-04-08 DIAGNOSIS — Z781 Physical restraint status: Secondary | ICD-10-CM

## 2021-04-08 DIAGNOSIS — Z7982 Long term (current) use of aspirin: Secondary | ICD-10-CM

## 2021-04-08 DIAGNOSIS — R569 Unspecified convulsions: Secondary | ICD-10-CM | POA: Diagnosis not present

## 2021-04-08 DIAGNOSIS — F17210 Nicotine dependence, cigarettes, uncomplicated: Secondary | ICD-10-CM | POA: Diagnosis present

## 2021-04-08 DIAGNOSIS — R0902 Hypoxemia: Secondary | ICD-10-CM | POA: Diagnosis present

## 2021-04-08 LAB — CBC
HCT: 43.1 % (ref 36.0–46.0)
Hemoglobin: 14.1 g/dL (ref 12.0–15.0)
MCH: 29.1 pg (ref 26.0–34.0)
MCHC: 32.7 g/dL (ref 30.0–36.0)
MCV: 89 fL (ref 80.0–100.0)
Platelets: 240 10*3/uL (ref 150–400)
RBC: 4.84 MIL/uL (ref 3.87–5.11)
RDW: 13.4 % (ref 11.5–15.5)
WBC: 3.4 10*3/uL — ABNORMAL LOW (ref 4.0–10.5)
nRBC: 0 % (ref 0.0–0.2)

## 2021-04-08 LAB — DIFFERENTIAL
Abs Immature Granulocytes: 0.02 10*3/uL (ref 0.00–0.07)
Basophils Absolute: 0 10*3/uL (ref 0.0–0.1)
Basophils Relative: 0 %
Eosinophils Absolute: 0 10*3/uL (ref 0.0–0.5)
Eosinophils Relative: 0 %
Immature Granulocytes: 1 %
Lymphocytes Relative: 17 %
Lymphs Abs: 0.6 10*3/uL — ABNORMAL LOW (ref 0.7–4.0)
Monocytes Absolute: 0.5 10*3/uL (ref 0.1–1.0)
Monocytes Relative: 14 %
Neutro Abs: 2.3 10*3/uL (ref 1.7–7.7)
Neutrophils Relative %: 68 %

## 2021-04-08 LAB — COMPREHENSIVE METABOLIC PANEL
ALT: 18 U/L (ref 0–44)
AST: 32 U/L (ref 15–41)
Albumin: 4.4 g/dL (ref 3.5–5.0)
Alkaline Phosphatase: 69 U/L (ref 38–126)
Anion gap: 7 (ref 5–15)
BUN: 9 mg/dL (ref 6–20)
CO2: 29 mmol/L (ref 22–32)
Calcium: 8.9 mg/dL (ref 8.9–10.3)
Chloride: 98 mmol/L (ref 98–111)
Creatinine, Ser: 0.59 mg/dL (ref 0.44–1.00)
GFR, Estimated: >60 mL/min (ref >60)
Glucose, Bld: 119 mg/dL — ABNORMAL HIGH (ref 70–99)
Potassium: 3.8 mmol/L (ref 3.5–5.1)
Sodium: 134 mmol/L — ABNORMAL LOW (ref 135–145)
Total Bilirubin: 0.6 mg/dL (ref 0.3–1.2)
Total Protein: 8.3 g/dL — ABNORMAL HIGH (ref 6.5–8.1)

## 2021-04-08 LAB — I-STAT CHEM 8, ED
BUN: 11 mg/dL (ref 6–20)
Calcium, Ion: 1.09 mmol/L — ABNORMAL LOW (ref 1.15–1.40)
Chloride: 102 mmol/L (ref 98–111)
Creatinine, Ser: 0.5 mg/dL (ref 0.44–1.00)
Glucose, Bld: 84 mg/dL (ref 70–99)
HCT: 45 % (ref 36.0–46.0)
Hemoglobin: 15.3 g/dL — ABNORMAL HIGH (ref 12.0–15.0)
Potassium: 4.2 mmol/L (ref 3.5–5.1)
Sodium: 141 mmol/L (ref 135–145)
TCO2: 31 mmol/L (ref 22–32)

## 2021-04-08 LAB — PROTIME-INR
INR: 1 (ref 0.8–1.2)
Prothrombin Time: 13.6 seconds (ref 11.4–15.2)

## 2021-04-08 LAB — RESP PANEL BY RT-PCR (FLU A&B, COVID) ARPGX2
Influenza A by PCR: NEGATIVE
Influenza B by PCR: NEGATIVE
SARS Coronavirus 2 by RT PCR: NEGATIVE

## 2021-04-08 LAB — APTT: aPTT: 38 seconds — ABNORMAL HIGH (ref 24–36)

## 2021-04-08 LAB — I-STAT BETA HCG BLOOD, ED (MC, WL, AP ONLY): I-stat hCG, quantitative: 5.7 m[IU]/mL — ABNORMAL HIGH (ref <5)

## 2021-04-08 IMAGING — CT CT HEAD CODE STROKE
4 series · 17 of 47 positions shown, 19 images · non-contrast
Comparison: CT head [DATE]

CLINICAL DATA: Code stroke. Acute neuro deficit. Slurred speech
aphasia.

EXAM:
CT HEAD WITHOUT CONTRAST
TECHNIQUE: Contiguous axial images were obtained from the base of the skull
through the vertex without intravenous contrast.

[Series 3: head wo · axial · 0.39mm/px · z∈[-118,+2]mm · 7 of 33 slices shown, 9 images]
[im 5/33  brain]
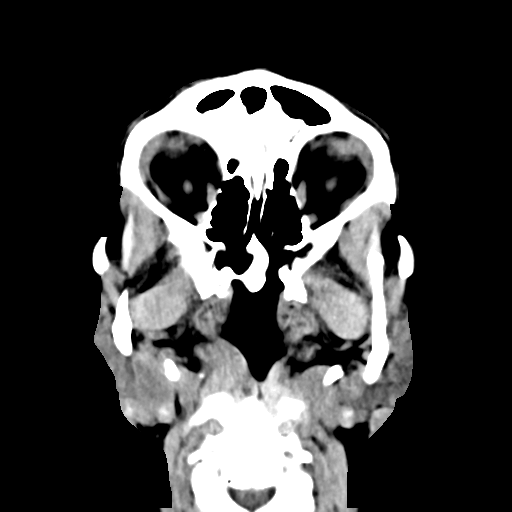
[im 5/33  bone]
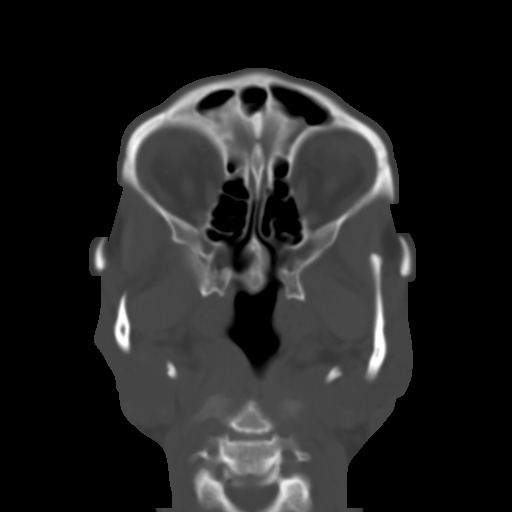
[im 9/33  brain]
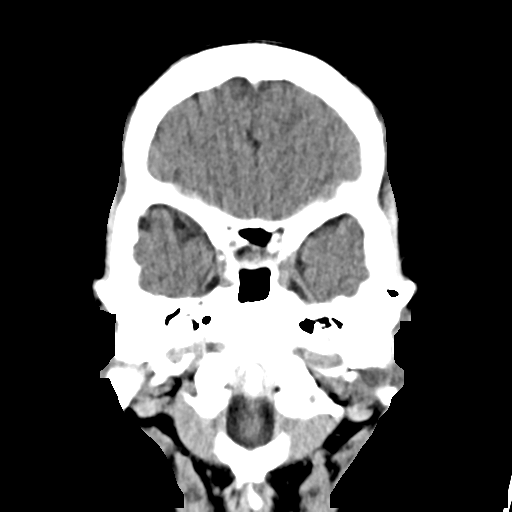
[im 13/33  brain]
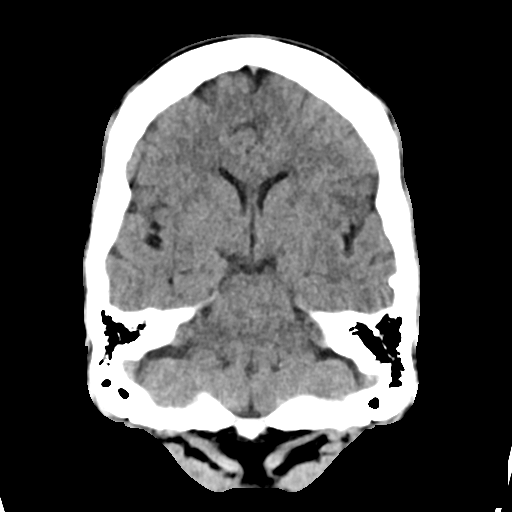
[im 17/33  brain]
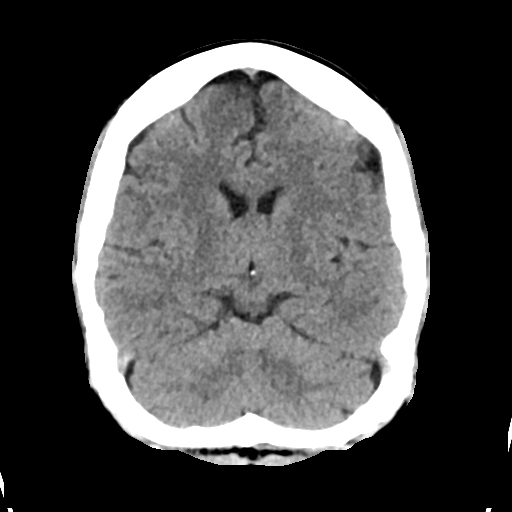
[im 21/33  brain]
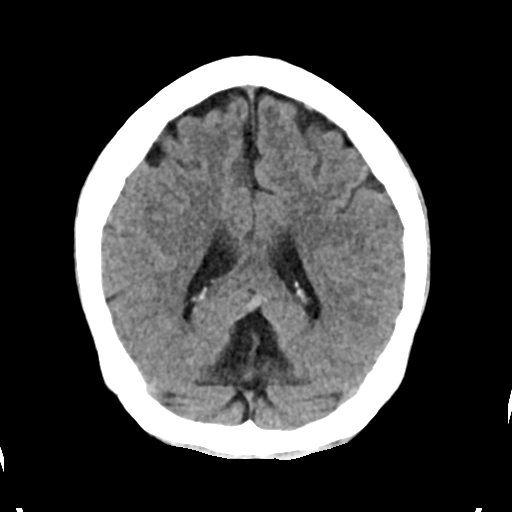
[im 21/33  bone]
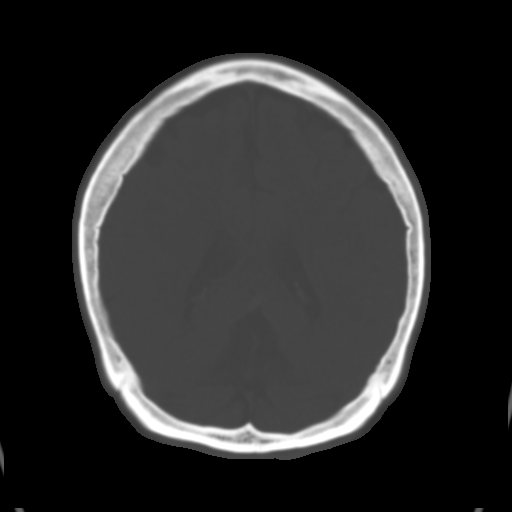
[im 25/33  brain]
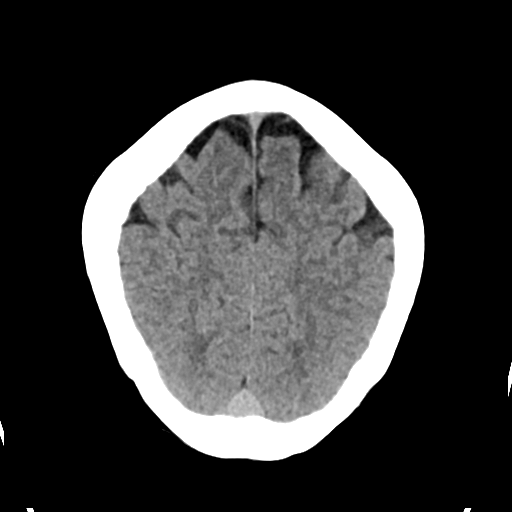
[im 29/33  brain]
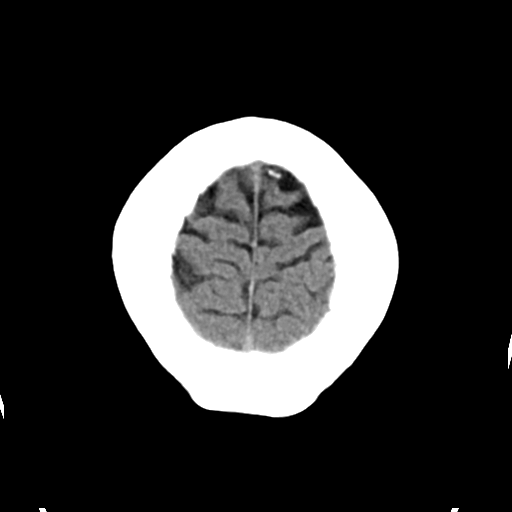

[Series 4: head bone · axial · 0.37mm/px · z∈[-122,-66]mm · 4 of 81 slices shown]
[im 9/81  bone]
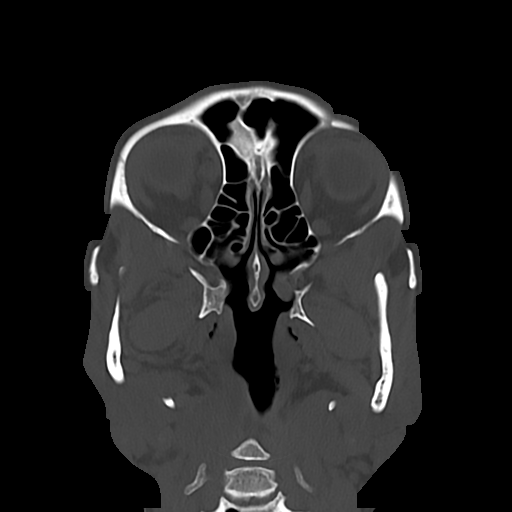
[im 17/81  bone]
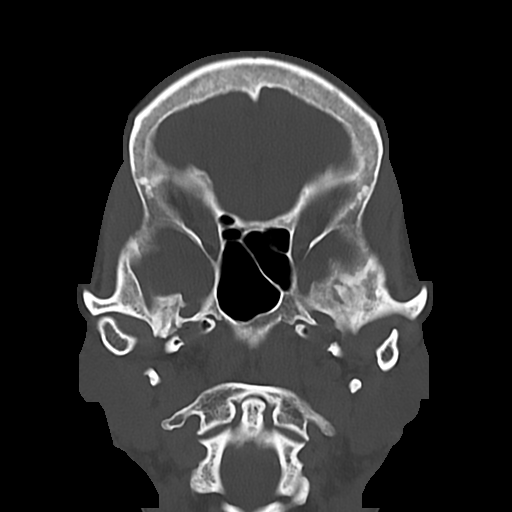
[im 25/81  bone]
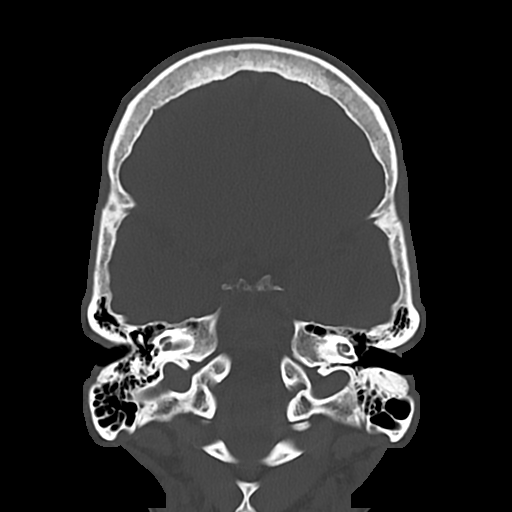
[im 37/81  bone]
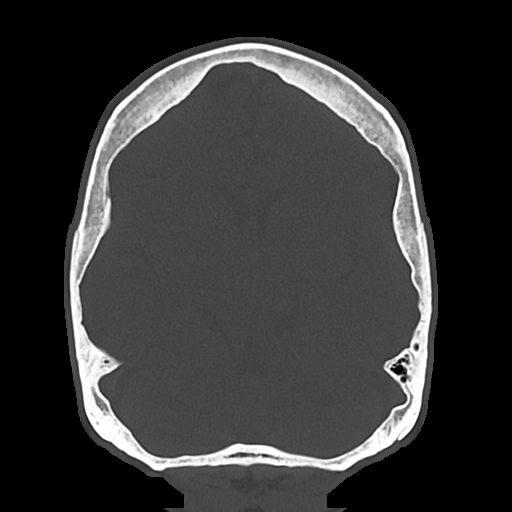

[Series 5: cor soft · coronal · 0.33mm/px · 3 of 62 slices shown]
[im 21/62  brain]
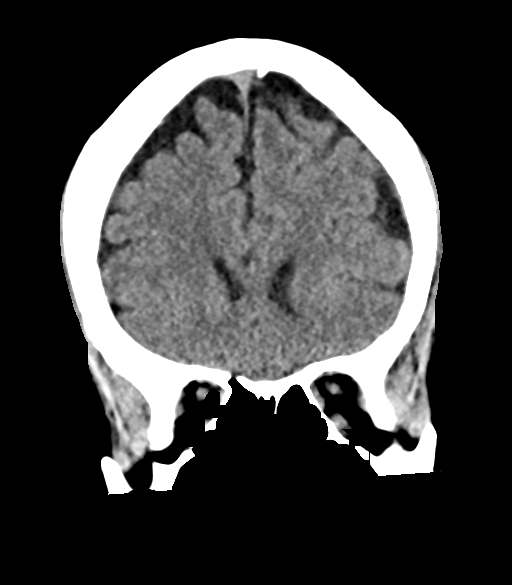
[im 28/62  brain]
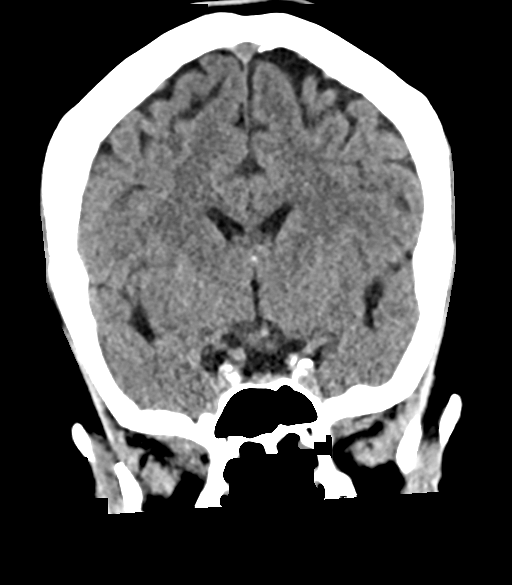
[im 34/62  brain]
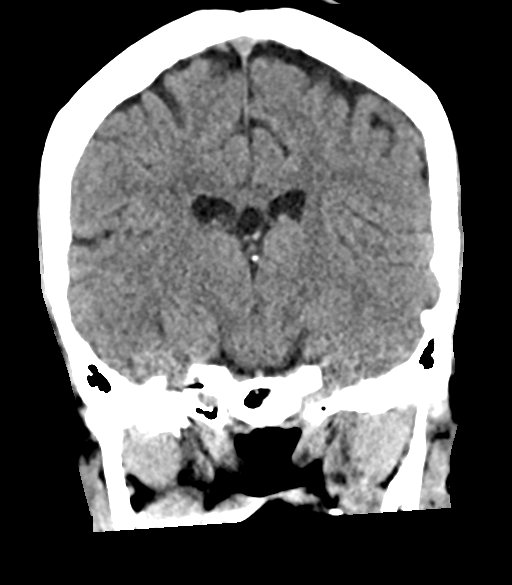

[Series 6: sag soft · sagittal · 0.35mm/px · 3 of 54 slices shown]
[im 18/54  brain]
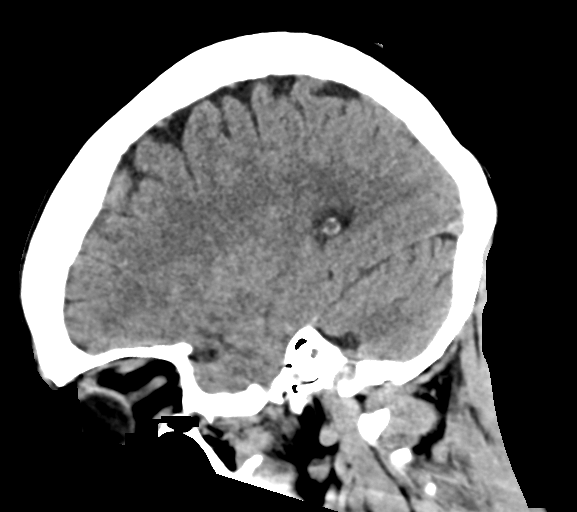
[im 27/54  brain]
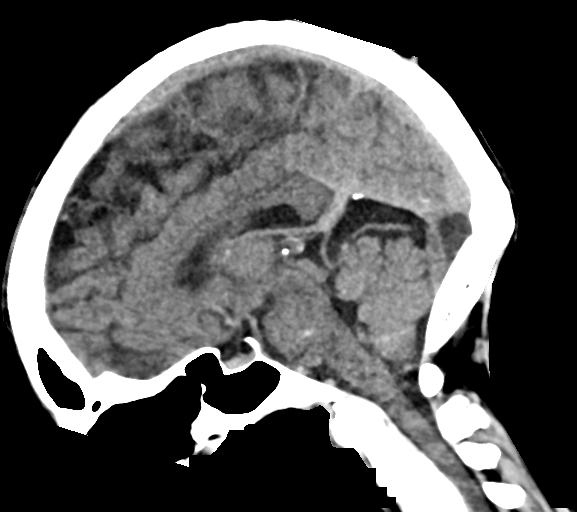
[im 36/54  brain]
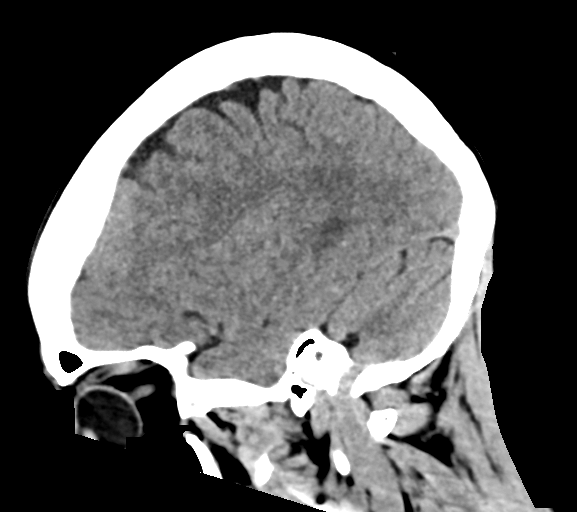

[17 of 47 positions shown; findings below may reference images not displayed]

FINDINGS: Brain: No evidence of acute infarction, hemorrhage, hydrocephalus,
extra-axial collection or mass lesion/mass effect.

Vascular: Negative for hyperdense vessel

Skull: Negative

Sinuses/Orbits: Negative

Other: None

ASPECTS (Alberta Stroke Program Early CT Score)

- Ganglionic level infarction (caudate, lentiform nuclei, internal
capsule, insula, M1-M3 cortex): 7

- Supraganglionic infarction (M4-M6 cortex): 3

Total score (0-10 with 10 being normal): 10
IMPRESSION: 1. Negative CT head
2. ASPECTS is 10
3. Code stroke imaging results were communicated on [DATE] at
[DATE] to provider [facility] via text page

## 2021-04-08 IMAGING — MR MR HEAD W/O CM
2 series · 48 of 48 positions shown · non-contrast
Comparison: CT angio head and neck [DATE].  MRI head [DATE]

CLINICAL DATA: Stroke follow-up.

EXAM:
MRI HEAD WITHOUT CONTRAST
TECHNIQUE: Multiplanar, multiecho pulse sequences of the brain and surrounding
structures were obtained without intravenous contrast.

[Series 2: DWI · axial · 3.0mm · 0.94mm/px · z∈[-112,+20]mm · 32 of 103 slices shown]
[im 1/103]
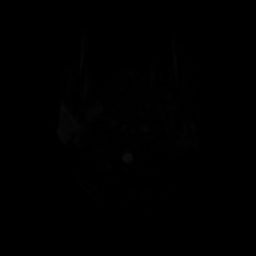
[im 4/103]
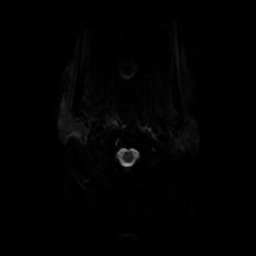
[im 7/103]
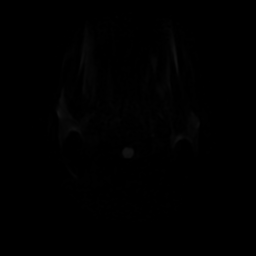
[im 10/103]
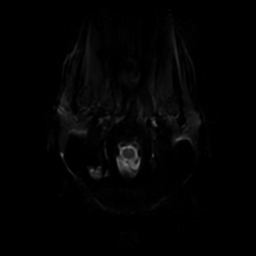
[im 14/103]
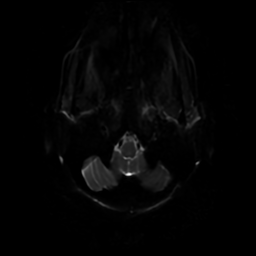
[im 17/103]
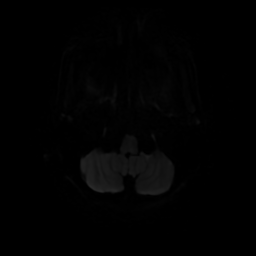
[im 20/103]
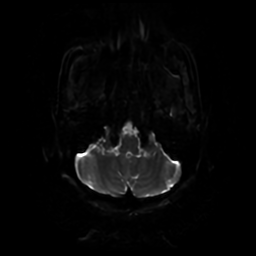
[im 24/103]
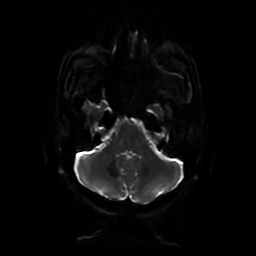
[im 27/103]
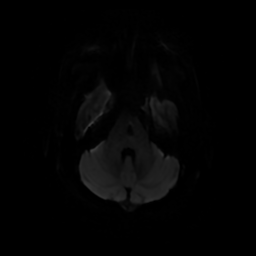
[im 30/103]
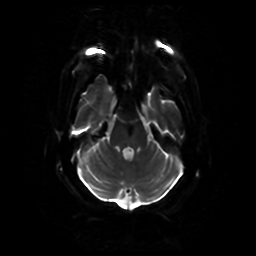
[im 33/103]
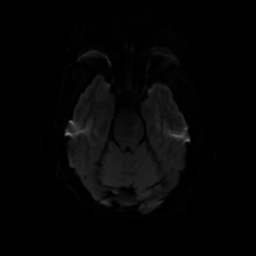
[im 37/103]
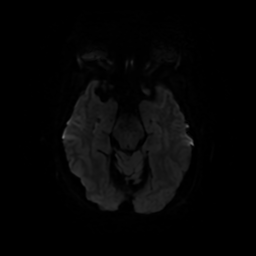
[im 40/103]
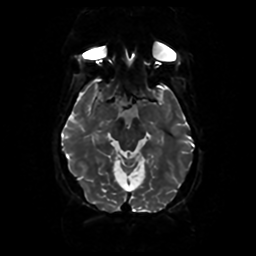
[im 43/103]
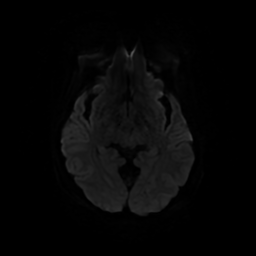
[im 47/103]
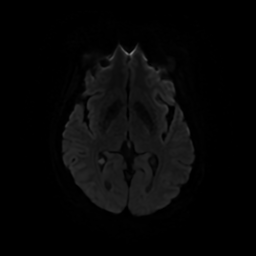
[im 50/103]
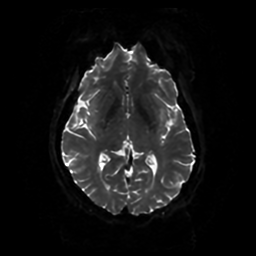
[im 53/103]
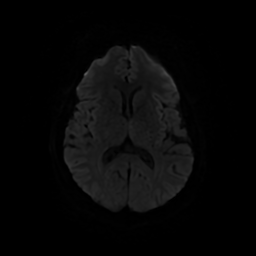
[im 56/103]
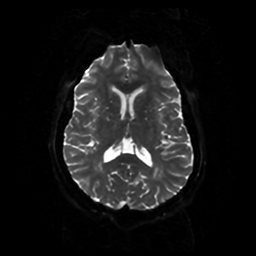
[im 60/103]
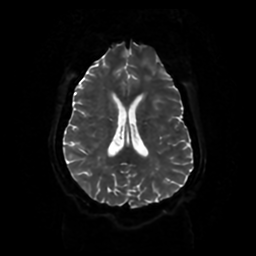
[im 63/103]
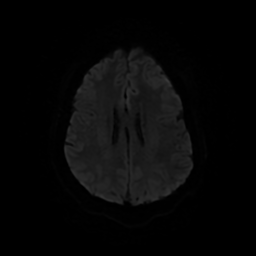
[im 66/103]
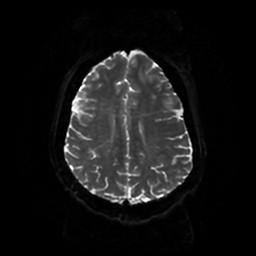
[im 70/103]
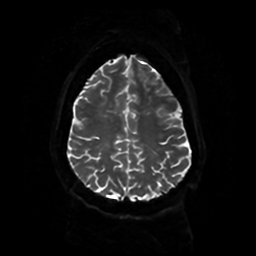
[im 73/103]
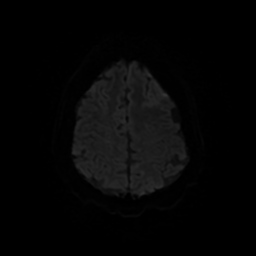
[im 76/103]
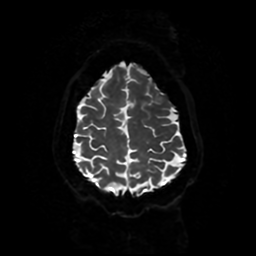
[im 79/103]
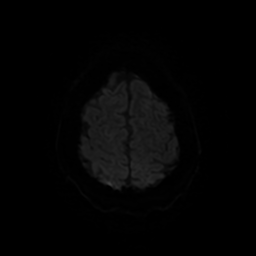
[im 83/103]
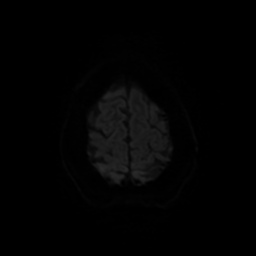
[im 86/103]
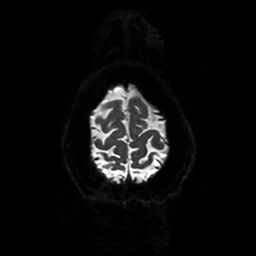
[im 89/103]
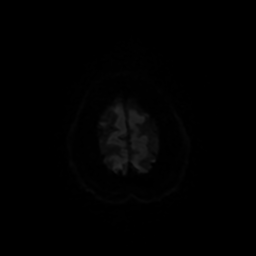
[im 93/103]
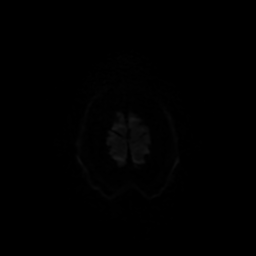
[im 96/103]
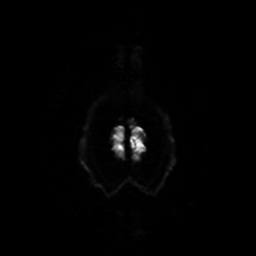
[im 99/103]
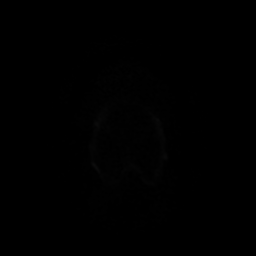
[im 103/103]
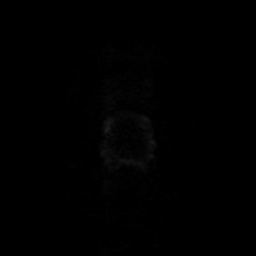

[Series 250: ADC · axial · 3.0mm · 0.94mm/px · z∈[-112,+20]mm · 16 of 52 slices shown]
[im 1/52]
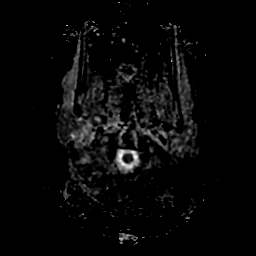
[im 4/52]
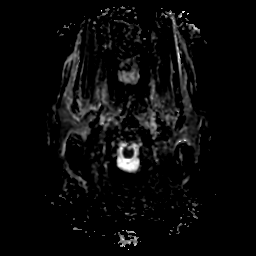
[im 7/52]
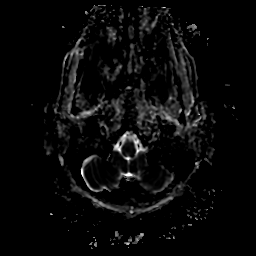
[im 11/52]
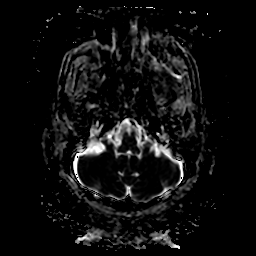
[im 14/52]
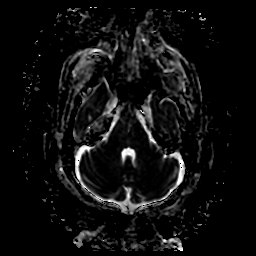
[im 18/52]
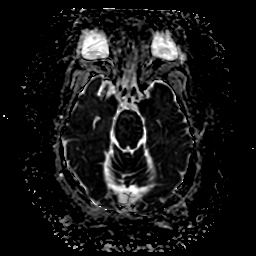
[im 21/52]
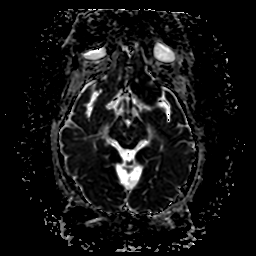
[im 24/52]
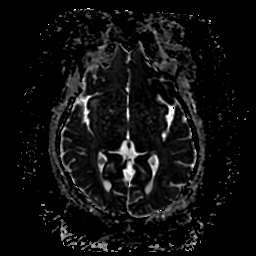
[im 28/52]
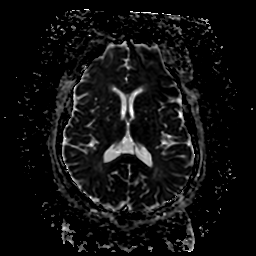
[im 31/52]
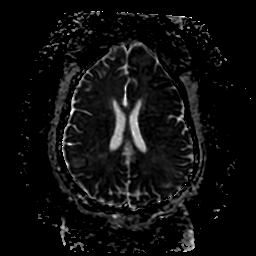
[im 35/52]
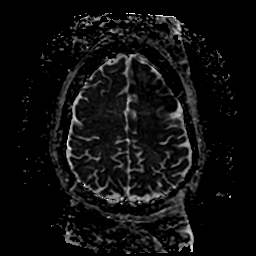
[im 38/52]
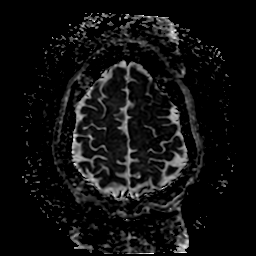
[im 41/52]
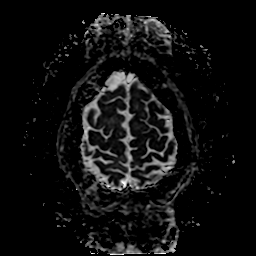
[im 45/52]
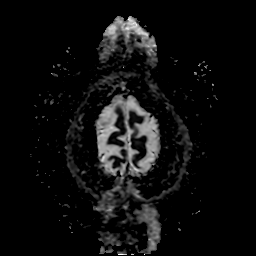
[im 48/52]
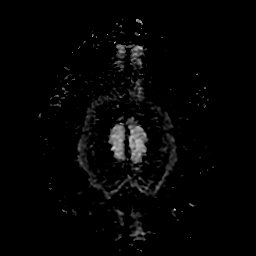
[im 52/52]
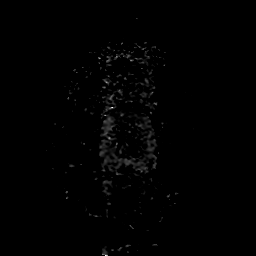

[48 of 48 positions shown; findings below may reference images not displayed]

FINDINGS: Brain: Limited study. Diffusion-weighted imaging only was performed.

No areas of restricted diffusion. No acute infarct. There is
susceptibility in the central pons most compatible with cavernoma.
This is unchanged from the prior MRI.

Review of the recent MRI reveals increased FLAIR signal in the left
anterior frontal lobe with associated serpiginous enhancement.
Follow-up recommended. This area does not show restricted diffusion.
Possible seizure related finding.
IMPRESSION: Negative for acute infarct. Limited to diffusion weighted imaging
only.

## 2021-04-08 MED ORDER — PANTOPRAZOLE SODIUM 40 MG IV SOLR
40.0000 mg | INTRAVENOUS | Status: DC
  Administered 2021-04-08 – 2021-04-10 (×3): 40 mg via INTRAVENOUS
  Filled 2021-04-08 (×3): qty 40

## 2021-04-08 MED ORDER — SODIUM CHLORIDE 0.9% FLUSH
3.0000 mL | Freq: Two times a day (BID) | INTRAVENOUS | Status: DC
  Administered 2021-04-08 – 2021-04-11 (×4): 3 mL via INTRAVENOUS

## 2021-04-08 MED ORDER — ACETAMINOPHEN 325 MG PO TABS: 650.0000 mg | ORAL_TABLET | Freq: Four times a day (QID) | ORAL | Status: DC | PRN

## 2021-04-08 MED ORDER — ACETAMINOPHEN 650 MG RE SUPP: 650.0000 mg | Freq: Four times a day (QID) | RECTAL | Status: DC | PRN

## 2021-04-08 MED ORDER — HALOPERIDOL LACTATE 5 MG/ML IJ SOLN
2.0000 mg | Freq: Once | INTRAMUSCULAR | Status: AC
  Administered 2021-04-08: 2 mg via INTRAVENOUS
  Filled 2021-04-08: qty 1

## 2021-04-08 MED ORDER — ENOXAPARIN SODIUM 40 MG/0.4ML IJ SOSY
40.0000 mg | PREFILLED_SYRINGE | INTRAMUSCULAR | Status: DC
  Administered 2021-04-08 – 2021-04-10 (×3): 40 mg via SUBCUTANEOUS
  Filled 2021-04-08 (×3): qty 0.4

## 2021-04-08 MED ORDER — LORAZEPAM 1 MG PO TABS: 1.0000 mg | ORAL_TABLET | ORAL | Status: AC | PRN

## 2021-04-08 MED ORDER — IOHEXOL 350 MG/ML SOLN
50.0000 mL | Freq: Once | INTRAVENOUS | Status: AC | PRN
  Administered 2021-04-08: 50 mL via INTRAVENOUS

## 2021-04-08 MED ORDER — HYDRALAZINE HCL 20 MG/ML IJ SOLN: 10.0000 mg | INTRAMUSCULAR | Status: DC | PRN

## 2021-04-08 MED ORDER — SODIUM CHLORIDE 0.9 % IV SOLN
75.0000 mL/h | INTRAVENOUS | Status: AC
  Administered 2021-04-08 – 2021-04-11 (×4): 75 mL/h via INTRAVENOUS

## 2021-04-08 MED ORDER — THIAMINE HCL 100 MG/ML IJ SOLN
100.0000 mg | Freq: Every day | INTRAMUSCULAR | Status: DC
  Administered 2021-04-08 – 2021-04-09 (×2): 100 mg via INTRAVENOUS
  Filled 2021-04-08 (×2): qty 2

## 2021-04-08 MED ORDER — IOHEXOL 350 MG/ML SOLN
40.0000 mL | Freq: Once | INTRAVENOUS | Status: AC | PRN
  Administered 2021-04-08: 40 mL via INTRAVENOUS

## 2021-04-08 MED ORDER — CALCIUM GLUCONATE-NACL 1-0.675 GM/50ML-% IV SOLN
1.0000 g | Freq: Once | INTRAVENOUS | Status: AC
  Administered 2021-04-08: 1000 mg via INTRAVENOUS
  Filled 2021-04-08: qty 50

## 2021-04-08 MED ORDER — CHLORHEXIDINE GLUCONATE 0.12% ORAL RINSE (MEDLINE KIT)
15.0000 mL | Freq: Two times a day (BID) | OROMUCOSAL | Status: DC
  Administered 2021-04-10 – 2021-04-11 (×3): 15 mL via OROMUCOSAL

## 2021-04-08 MED ORDER — FOLIC ACID 1 MG PO TABS
1.0000 mg | ORAL_TABLET | Freq: Every day | ORAL | Status: DC
  Administered 2021-04-10 – 2021-04-11 (×2): 1 mg via ORAL
  Filled 2021-04-08 (×2): qty 1

## 2021-04-08 MED ORDER — ADULT MULTIVITAMIN W/MINERALS CH
1.0000 | ORAL_TABLET | Freq: Every day | ORAL | Status: DC
  Administered 2021-04-08 – 2021-04-11 (×3): 1 via ORAL
  Filled 2021-04-08 (×2): qty 1

## 2021-04-08 MED ORDER — LEVETIRACETAM IN NACL 1000 MG/100ML IV SOLN
1000.0000 mg | Freq: Two times a day (BID) | INTRAVENOUS | Status: DC
  Administered 2021-04-08: 1000 mg via INTRAVENOUS
  Filled 2021-04-08: qty 100

## 2021-04-08 MED ORDER — ALBUTEROL SULFATE (2.5 MG/3ML) 0.083% IN NEBU: 2.5000 mg | INHALATION_SOLUTION | Freq: Four times a day (QID) | RESPIRATORY_TRACT | Status: DC | PRN

## 2021-04-08 MED ORDER — NICOTINE 14 MG/24HR TD PT24: 14.0000 mg | MEDICATED_PATCH | Freq: Every day | TRANSDERMAL | Status: DC | PRN

## 2021-04-08 MED ORDER — LEVETIRACETAM IN NACL 1000 MG/100ML IV SOLN
1000.0000 mg | Freq: Once | INTRAVENOUS | Status: AC
  Administered 2021-04-08: 1000 mg via INTRAVENOUS
  Filled 2021-04-08: qty 100

## 2021-04-08 MED ORDER — THIAMINE HCL 100 MG PO TABS
100.0000 mg | ORAL_TABLET | Freq: Every day | ORAL | Status: DC
  Administered 2021-04-10 – 2021-04-11 (×2): 100 mg via ORAL
  Filled 2021-04-08 (×2): qty 1

## 2021-04-08 MED ORDER — LORAZEPAM 2 MG/ML IJ SOLN
1.0000 mg | INTRAMUSCULAR | Status: DC | PRN
Start: 2021-04-08 — End: 2021-04-11
  Administered 2021-04-09: 2 mg via INTRAVENOUS
  Filled 2021-04-08: qty 1

## 2021-04-08 MED ORDER — SODIUM CHLORIDE 0.9% FLUSH
3.0000 mL | Freq: Once | INTRAVENOUS | Status: AC
  Administered 2021-04-08: 3 mL via INTRAVENOUS

## 2021-04-08 MED ORDER — LORAZEPAM 2 MG/ML IJ SOLN
1.0000 mg | INTRAMUSCULAR | Status: AC | PRN
  Administered 2021-04-10: 1 mg via INTRAVENOUS
  Administered 2021-04-10: 2 mg via INTRAVENOUS
  Filled 2021-04-08 (×2): qty 1

## 2021-04-08 MED ORDER — ORAL CARE MOUTH RINSE
15.0000 mL | OROMUCOSAL | Status: DC
  Administered 2021-04-08 – 2021-04-11 (×16): 15 mL via OROMUCOSAL

## 2021-04-08 NOTE — ED Notes (Signed)
Pt had a witnessed 45 second seizure, provider at bedside.  Pt starring right.  Pt alert.  Pt voided during seizure, pt cleansed, pad applied under pt purwick in -place.

## 2021-04-08 NOTE — Progress Notes (Signed)
EEG done at bedside. No skin breakdown noted. Results pending. 

## 2021-04-08 NOTE — H&P (Addendum)
History and Physical    Martha Schneider ZOX:[id number] DOB: 07-Mar-1966 DOA: 04/08/2021  Referring MD/NP/PA: Susy Frizzle, MD PCP: Darrow Bussing, MD  Patient coming from: Home via EMS  Chief Complaint: Altered  I have personally briefly reviewed patient's old medical records in [facility]   HPI: Martha Schneider is a 55 y.o. female with medical history significant of hypertension, chronic leukopenia, sarcoidosis, tobacco abuse, and depression who presents after being acutely altered this morning.  History is obtained from the patient's husband who is present at bedside as the patient is currently altered.  Patient just recently been hospitalized from 12/6-12/7 after being witnessed to have a new onset generalized tonic-clonic seizure while she was at work.  Husband notes that he had actually been on the phone with her when she dropped the phone but he was unsure why until notified by her job.  MRI of the brain was negative for any acute signs of a stroke, but did note concern for a small vascular malformation in the left frontal lobe of the brain.  Neurology had evaluated the patient at that time, and she was started on Keppra 500 mg twice daily to follow-up with neurology in the outpatient setting.  This morning around 10 AM he states that they were watching TV when she randomly got up and started fidgeting through her purse.  He asked what she was looking for but only responded with yes and possibly mumbled some other words.  After 15 minutes or so she was no longer able to respond fine and was just staring off.  Denies seeing any generalized tonic-clonic seizure activity.  No reported tongue biting or loss of bowel or bladder. He reports that she had been taking Keppra 500 mg twice daily as advised.  The patient still reportedly smokes and drinks 4-5 beers per day on average.  ED Course: On admission into the emergency department patient was initially seen as a code stroke for reports of aphasia.   Neurology have been notified and evaluated the patient.  CT cerebral perfusion was negative for any signs of a stroke.  She is not a tPA candidate as symptoms thought likely secondary to seizure.  She was noted to be afebrile, pulse 85-1 03, blood pressures elevated up to 186/110..  Labs noted WBC 3.4 and ionized calcium 1.09.  she was loaded with Keppra 1000 mg.  Patient was witnessed by the RN while in the ED having a brief 45-second generalized tonic-clonic seizure.  Following patient was noted to be postictal and briefly hypoxic which she was placed on a nonrebreather and it was able to be weaned down to nasal cannula oxygen.  Neurology recommende blood in the patient with wound Keppra 1 g and then starting her on Keppra 1000 mg twice daily. Admission for long-term EEG monitoring and MRI of the brain with and without contrast.  Review of Systems  Unable to perform ROS: Mental status change   Past Medical History:  Diagnosis Date   Depression    Hypertension    Sarcoidosis     No past surgical history on file.   reports that she has been smoking. She has a 18.50 pack-year smoking history. She has never used smokeless tobacco. She reports current alcohol use. She reports that she does not use drugs.  No Known Allergies  Family History  Problem Relation Age of Onset   Heart attack Mother    Liver cancer Father    Diabetes Mellitus II Sister  Kidney disease Sister     Prior to Admission medications   Medication Sig Start Date End Date Taking? Authorizing Provider  amLODipine (NORVASC) 10 MG tablet Take 10 mg by mouth daily. 01/20/21   [provider]  aspirin 325 MG tablet Take 325 mg by mouth daily.    [provider]  dextromethorphan-guaiFENesin (MUCINEX DM) 30-600 MG per 12 hr tablet Take 1 tablet by mouth 2 (two) times daily as needed for cough.    [provider]  levETIRAcetam (KEPPRA) 500 MG tablet Take 1 tablet (500 mg total) by mouth 2 (two) times  daily. 04/04/21   Leatha Gilding, MD  nicotine (NICODERM CQ - DOSED IN MG/24 HOURS) 14 mg/24hr patch Place 1 patch (14 mg total) onto the skin daily as needed (smoking cessation). 04/04/21   Leatha Gilding, MD  sertraline (ZOLOFT) 100 MG tablet Take 100 mg by mouth daily. 12/16/19   [provider]    Physical Exam:  Constitutional: Thin middle-age female who appears acutely altered and currently following commands at this time Vitals:   04/08/21 1251 04/08/21 1306 04/08/21 1315 04/08/21 1330  BP:  (!) 162/104 (!) 158/90 (!) 179/108  Pulse:  89 94 (!) 103  Resp:  13 18 14   Temp:  97.9 F (36.6 C)    TempSrc:  Oral    SpO2:  95% 92% 100%  Weight: 51 kg     Height: 5\' 4"  (1.626 m)      Eyes: PERRL, lids and conjunctivae normal ENMT: Mucous membranes are dry.  Posterior pharynx clear of any exudate or lesions.  Neck: normal, supple, no masses, no thyromegaly Respiratory: clear to auscultation bilaterally, no wheezing, no crackles. Normal respiratory effort. No accessory muscle use.  Cardiovascular: Regular rate and rhythm, no murmurs / rubs / gallops. No extremity edema. 2+ pedal pulses. No carotid bruits.  Abdomen: no tenderness, no masses palpated. No hepatosplenomegaly. Bowel sounds positive.  Musculoskeletal: no clubbing / cyanosis. No joint deformity upper and lower extremities. Good ROM, no contractures. Normal muscle tone.  Skin: no rashes, lesions, ulcers. No induration Neurologic: CN 2-12 grossly intact.  Patient able to move all extremities Psychiatric: Unable to assess as acutely.    Labs on Admission: I have personally reviewed following labs and imaging studies  CBC: Recent Labs  Lab 04/03/21 1820 04/04/21 0535 04/08/21 1206 04/08/21 1212  WBC 2.7* 2.4* 3.4*  --   NEUTROABS 1.6* 1.1* 2.3  --   HGB 11.7* 12.3 14.1 15.3*  HCT 36.6 36.5 43.1 45.0  MCV 89.9 87.5 89.0  --   PLT 210 210 240  --    Basic Metabolic Panel: Recent Labs  Lab  04/03/21 1820 04/04/21 0535 04/08/21 1212  NA 138 135 141  K 3.7 3.2* 4.2  CL 101 99 102  CO2 28 27  --   GLUCOSE 107* 82 84  BUN 14 9 11   CREATININE 0.78 0.63 0.50  CALCIUM 9.1 8.8*  --   MG  --  2.0  --    GFR: Estimated Creatinine Clearance: 63.2 mL/min (by C-G formula based on SCr of 0.5 mg/dL). Liver Function Tests: Recent Labs  Lab 04/03/21 1820 04/04/21 0535  AST 26 26  ALT 16 13  ALKPHOS 59 56  BILITOT 0.6 0.8  PROT 7.0 7.1  ALBUMIN 3.7 3.6   No results for input(s): LIPASE, AMYLASE in the last 168 hours. No results for input(s): AMMONIA in the last 168 hours. Coagulation Profile: Recent Labs  Lab 04/04/21 0535 04/08/21 1206  INR 1.1 1.0   Cardiac Enzymes: No results for input(s): CKTOTAL, CKMB, CKMBINDEX, TROPONINI in the last 168 hours. BNP (last 3 results) No results for input(s): PROBNP in the last 8760 hours. HbA1C: No results for input(s): HGBA1C in the last 72 hours. CBG: Recent Labs  Lab 04/04/21 0728  GLUCAP 98   Lipid Profile: No results for input(s): CHOL, HDL, LDLCALC, TRIG, CHOLHDL, LDLDIRECT in the last 72 hours. Thyroid Function Tests: No results for input(s): TSH, T4TOTAL, FREET4, T3FREE, THYROIDAB in the last 72 hours. Anemia Panel: No results for input(s): VITAMINB12, FOLATE, FERRITIN, TIBC, IRON, RETICCTPCT in the last 72 hours. Urine analysis:    Component Value Date/Time   BILIRUBINUR Negative 12/17/2013 1608   PROTEINUR Negaitve 12/17/2013 1608   UROBILINOGEN 0.2 12/17/2013 1608   NITRITE Negative 12/17/2013 1608   LEUKOCYTESUR Negative 12/17/2013 1608   Sepsis Labs: Recent Results (from the past 240 hour(s))  Resp Panel by RT-PCR (Flu A&B, Covid) Nasopharyngeal Swab     Status: None   Collection Time: 04/03/21  9:28 PM   Specimen: Nasopharyngeal Swab; Nasopharyngeal(NP) swabs in vial transport medium  Result Value Ref Range Status   SARS Coronavirus 2 by RT PCR NEGATIVE NEGATIVE Final    Comment: (NOTE) SARS-CoV-2  target nucleic acids are NOT DETECTED.  The SARS-CoV-2 RNA is generally detectable in upper respiratory specimens during the acute phase of infection. The lowest concentration of SARS-CoV-2 viral copies this assay can detect is 138 copies/mL. A negative result does not preclude SARS-Cov-2 infection and should not be used as the sole basis for treatment or other patient management decisions. A negative result may occur with  improper specimen collection/handling, submission of specimen other than nasopharyngeal swab, presence of viral mutation(s) within the areas targeted by this assay, and inadequate number of viral copies(<138 copies/mL). A negative result must be combined with clinical observations, patient history, and epidemiological information. The expected result is Negative.  Fact Sheet for Patients:  BloggerCourse.com  Fact Sheet for Healthcare Providers:  SeriousBroker.it  This test is no t yet approved or cleared by the [place] FDA and  has been authorized for detection and/or diagnosis of SARS-CoV-2 by FDA under an Emergency Use Authorization (EUA). This EUA will remain  in effect (meaning this test can be used) for the duration of the COVID-19 declaration under Section 564(b)(1) of the Act, 21 U.S.C.section 360bbb-3(b)(1), unless the authorization is terminated  or revoked sooner.       Influenza A by PCR NEGATIVE NEGATIVE Final   Influenza B by PCR NEGATIVE NEGATIVE Final    Comment: (NOTE) The Xpert Xpress SARS-CoV-2/FLU/RSV plus assay is intended as an aid in the diagnosis of influenza from Nasopharyngeal swab specimens and should not be used as a sole basis for treatment. Nasal washings and aspirates are unacceptable for Xpert Xpress SARS-CoV-2/FLU/RSV testing.  Fact Sheet for Patients: BloggerCourse.com  Fact Sheet for Healthcare  Providers: SeriousBroker.it  This test is not yet approved or cleared by the [place] FDA and has been authorized for detection and/or diagnosis of SARS-CoV-2 by FDA under an Emergency Use Authorization (EUA). This EUA will remain in effect (meaning this test can be used) for the duration of the COVID-19 declaration under Section 564(b)(1) of the Act, 21 U.S.C. section 360bbb-3(b)(1), unless the authorization is terminated or revoked.  Performed at [facility], [address], [place], [place] [address]      Radiological Exams on Admission: CT CEREBRAL  PERFUSION W CONTRAST  Result Date: 04/08/2021 CLINICAL DATA:  Code stroke.  Bilateral cerebellar infarct. EXAM: CT ANGIOGRAPHY HEAD AND NECK CT PERFUSION BRAIN TECHNIQUE: Multidetector CT imaging of the head and neck was performed using the standard protocol during bolus administration of intravenous contrast. Multiplanar CT image reconstructions and MIPs were obtained to evaluate the vascular anatomy. Carotid stenosis measurements (when applicable) are obtained utilizing NASCET criteria, using the distal internal carotid diameter as the denominator. Multiphase CT imaging of the brain was performed following IV bolus contrast injection. Subsequent parametric perfusion maps were calculated using RAPID software. CONTRAST:  40mL OMNIPAQUE IOHEXOL 350 MG/ML SOLN; 50mL OMNIPAQUE IOHEXOL 350 MG/ML SOLN COMPARISON:  MRI head and CT head 04/08/2021 FINDINGS: CTA NECK FINDINGS Aortic arch: Standard branching. Imaged portion shows no evidence of aneurysm or dissection. No significant stenosis of the major arch vessel origins. Right carotid system: Normal right carotid. Negative for atherosclerotic disease or dissection. Left carotid system: Normal left carotid. Negative for atherosclerotic disease or dissection. Tortuosity of the left internal carotid artery. Vertebral arteries: Normal vertebral arteries bilaterally.  Skeleton: Cervical kyphosis which may be positional. No acute skeletal abnormality. Other neck: Negative Upper chest: Mild patchy airspace disease right upper lobe posteriorly. This may be scarring and is unchanged from chest CT of 11/10/2019 Review of the MIP images confirms the above findings CTA HEAD FINDINGS Anterior circulation: Cavernous carotid patent bilaterally. Anterior and middle cerebral arteries patent bilaterally without stenosis or large vessel occlusion. Review of the prior MRI with contrast 04/04/2021. This demonstrated serpiginous enhancement in the left anterior frontal lobe. No vascular malformation is seen in this area on CTA. This may have been due to cerebritis or luxury perfusion from recent infarction on the prior MRI. Recommend attention on MRI scheduled for later today. Posterior circulation: Both vertebral arteries patent to the basilar. Basilar widely patent. Posterior cerebral arteries widely patent bilaterally. Venous sinuses: Normal venous enhancement. Anatomic variants: None Review of the MIP images confirms the above findings CT Brain Perfusion Findings: ASPECTS: 10 CBF (<30%) Volume: 0mL Perfusion (Tmax>6.0s) volume: 0mL Mismatch Volume: 0mL Infarction Location:None IMPRESSION: 1. CT perfusion negative for acute infarction. 2. Negative for intracranial large vessel occlusion 3. Normal carotid and vertebral arteries bilaterally. No significant atherosclerotic disease. 4. 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 Serpiginous contrast enhancement was seen on the recent MRI. This enhancement may have been due to luxury perfusion from recent infarct or cerebritis. Attention on follow-up MRI scheduled for today. 5. These results were called by telephone at the time of interpretation on 04/08/2021 at 1:02 pm to provider XU , who verbally acknowledged these results. Electronically Signed   By: Marlan Palau M.D.   On: 04/08/2021 13:02   CT HEAD CODE STROKE WO  CONTRAST  Result Date: 04/08/2021 CLINICAL DATA:  Code stroke. Acute neuro deficit. Slurred speech aphasia. EXAM: CT HEAD WITHOUT CONTRAST TECHNIQUE: Contiguous axial images were obtained from the base of the skull through the vertex without intravenous contrast. COMPARISON:  CT head 04/03/2021 FINDINGS: Brain: No evidence of acute infarction, hemorrhage, hydrocephalus, extra-axial collection or mass lesion/mass effect. Vascular: Negative for hyperdense vessel Skull: Negative Sinuses/Orbits: Negative Other: None ASPECTS (Alberta Stroke Program Early CT Score) - Ganglionic level infarction (caudate, lentiform nuclei, internal capsule, insula, M1-M3 cortex): 7 - Supraganglionic infarction (M4-M6 cortex): 3 Total score (0-10 with 10 being normal): 10 IMPRESSION: 1. Negative CT head 2. ASPECTS is 10 3. Code stroke imaging results were communicated on 04/08/2021 at 12:26 pm to  provider Roda Shutters via text page Electronically Signed   By: Marlan Palau M.D.   On: 04/08/2021 12:26    EKG: Independently reviewed.  Sinus rhythm at 95 bpm with first-degree heart block.  Assessment/Plan Seizures postictal: Acute.   patient presents after noticed to be acutely altered at home with her husband.  No reports of seizure-like activity at that time, but patient was witnessed by RN in ED having 45 seconds generalized tonic-clonic seizure activity.  Patient had been reportedly taking Keppra 500 mg after initial seizure on 12/6.  CT perfusion negative for any acute abnormality.  Neurology recommended admission for long-term EEG monitoring contrast.  Patient required being put in restraints due to movement and had been given Haldol 2 mg IV x1 dose -Admit to a medical telemetry movement -Seizure precautions with monitoring -EEG monitoring per neurology -Check MRI of the brain with and without contrast -Ativan IV as needed for seizure -Keppra 1000 mg twice daily -Appreciate neurology consultative services, will follow-up for any  further recommendation  Hypertensive urgency: Initial blood pressure elevated up to 186/110.  Home blood pressure medications include amlodipine 10 mg daily. -Continue amlodipine when able -Hydralazine IV as needed  Hypoxia secondary to seizure: O2 saturation temporarily dropped down below 89% during seizure. -Continuous pulse oximetry for now  Hypocalcemia: Acute.  Ionized calcium low at 1.09. -Give calcium gluconate 1 g IV -Continue to monitor and replace as needed  Leukopenia: Chronic.  WBC 3.2 which appears near patient's baseline.  She is followed in the outpatient by oncology\ -Continue outpatient follow-up with oncology  Depression -Continue Zoloft  History of sarcoidosis -Continue outpatient follow-up with pulmonology  Suspected vascular malformation: Noted on MRI of the brain vascular malformation in the left frontal pole.  Alcohol abuse: Patient currently drinks 4-5 beers per day on average.  Unclear if this is related with patient's recurrent seizures. -CIWA protocols initiated -Folate, thiamine, MVI  Tobacco abuse -Nicotine patch -Continue to counsel on need of cessation of tobacco use  GI prophylaxis: Protonix IV DVT prophylaxis: Lovenox Code Status: Full Family Communication: Husband updated at bedside Disposition Plan: Hopefully discharge home once medically stable Consults called: Neurology Admission status: Inpatient, require more than 2 midnight stay given need for further work-up of current seizures  Clydie Braun MD [facility]   If 7PM-7AM, please contact night-coverage   04/08/2021, 1:51 PM

## 2021-04-08 NOTE — ED Notes (Signed)
MRI notified that pt is ready for MRI

## 2021-04-08 NOTE — ED Notes (Incomplete)
This RN contacted Rapid Response RN for clarity regarding order and documentation frequency for restraints r/t bilateral upper and lower soft restraints. This RN under impression that once pt is placed in four point restraints+

## 2021-04-08 NOTE — ED Triage Notes (Signed)
Pt arrived code stroke last known well 1000 per family

## 2021-04-08 NOTE — Consult Note (Signed)
Stroke Neurology Consultation Note  Consult Requested by: Dr. Bernette Mayers  Reason for Consult: code stroke  Consult Date: 04/08/21   The history was obtained from the pt husband and EMS.  During history and examination, all items were able to obtain unless otherwise noted.  History of Present Illness:  Avalina Benko is a 55 y.o. African American female with PMH of hypertension, smoker and recent seizure presenting to ED for speech arrest, staring off and altered mental status.  Patient was here on 04/03/2021 for a witnessed GTC inside her car after speech arrest and dropped her cell phone while on the phone with her husband, reported lasted 4-47min. In ED, pt back to baseline.  Seizure work-up including CT, EEG unremarkable.  MRI with and without contrast showed small area of T2 hyperintensity and cluster of serpiginous contrast enhancement at the left frontal Pole, ? small AVM. She was discharged with keppra  bid. Per husband, she was taking the medication as prescribed. Etiology of seizure not quite clear, concerning extreme stress from work. Educated on no driving until seizure free for 6 months.   Pt was at home since discharge, doing well, plan to go back to work tomorrow. Around 10am, husband found her keep searching something in her purse for or so, he asked pt "what are you looking for". She kept answering with "yes" only, then mumbled her words. Around 10:30am, pt started staring off and not responding. Husband called EMS. EMS found pt to be mute, not speaking and not responding to questions, but moving all extremities without shaking or jerking. BP 186/110, glucose 120. Pt was brought in as code stroke.  In ER, pt awake alert but not responding to questions and not talking. However, moving all extremities well and able to pantomime. Blinking to visual threat bilaterally, tracking bilaterally. CT, CTA head and neck, CTP as well as MRI DWI all negative. Stat EEG requested. Then EDP  reported that RN witnessed 45sec of GTC. Put on keppra 1g load and will increase maintenance to 1g bid.   Discussed with Dr. Chestine Spore neuroradiology, pt no AVM at left frontal lobe but left frontal hyperintensity needs to have repeat MRI with and without contrast. Will order and also will do LTM EEG. Husband updated on the above management.   LSN: 10am tPA Given: No: MRI neg, likely seizure  Past Medical History:  Diagnosis Date   Depression    Hypertension    Sarcoidosis     No past surgical history on file.  Family History  Problem Relation Age of Onset   Heart attack Mother    Liver cancer Father    Diabetes Mellitus II Sister    Kidney disease Sister     Social History:  reports that she has been smoking. She has a 18.50 pack-year smoking history. She has never used smokeless tobacco. She reports current alcohol use. She reports that she does not use drugs.  Allergies: No Known Allergies  No current facility-administered medications on file prior to encounter.   Current Outpatient Medications on File Prior to Encounter  Medication Sig Dispense Refill   amLODipine (NORVASC) 10 MG tablet Take 10 mg by mouth daily.     aspirin 325 MG tablet Take 325 mg by mouth daily.     dextromethorphan-guaiFENesin (MUCINEX DM) 30-600 MG per 12 hr tablet Take 1 tablet by mouth 2 (two) times daily as needed for cough.     levETIRAcetam (KEPPRA) 500 MG tablet Take 1 tablet (500 mg total)  by mouth 2 (two) times daily. 60 tablet 0   nicotine (NICODERM CQ - DOSED IN MG/24 HOURS) 14 mg/24hr patch Place 1 patch (14 mg total) onto the skin daily as needed (smoking cessation). 28 patch 0   sertraline (ZOLOFT) 100 MG tablet Take 100 mg by mouth daily.      Review of Systems: A full ROS was attempted today and was not able to be performed due to speech arrest from seizure.   Physical Examination: Temp:  [97.9 F (36.6 C)-98.6 F (37 C)] 97.9 F (36.6 C) (12/11 1306) Pulse Rate:  [85-89] 89 (12/11  1306) Resp:  [13-18] 13 (12/11 1306) BP: (162-186)/(104-110) 162/104 (12/11 1306) SpO2:  [95 %-100 %] 95 % (12/11 1306) Weight:  [51 kg] 51 kg (12/11 1251)  General - well nourished, well developed, in no apparent distress.    Ophthalmologic - fundi not visualized due to noncooperation.    Cardiovascular - regular rhythm and rate  Neuro - awake, alert, eyes open, mute, no speech output, not following commands but seems able to pantomime some actions. No gaze palsy, tracking bilaterally, blinking to be described bilaterally. No facial droop. Tongue protrusion not corporative. Bilateral UEs no drift. Bilaterally LEs no drift. Sensation, coordination and gait not tested.  NIH Stroke Scale  Level Of Consciousness 0=Alert; keenly responsive 1=Arouse to minor stimulation 2=Requires repeated stimulation to arouse or movements to pain 3=postures or unresponsive 0  LOC Questions to Month and Age 43=Answers both questions correctly 1=Answers one question correctly or dysarthria/intubated/trauma/language barrier 2=Answers neither question correctly or aphasia 2  LOC Commands      -Open/Close eyes     -Open/close grip     -Pantomime commands if communication barrier 0=Performs both tasks correctly 1=Performs one task correctly 2=Performs neighter task correctly 2  Best Gaze     -Only assess horizontal gaze 0=Normal 1=Partial gaze palsy 2=Forced deviation, or total gaze paresis 0  Visual 0=No visual loss 1=Partial hemianopia 2=Complete hemianopia 3=Bilateral hemianopia (blind including cortical blindness) 0  Facial Palsy     -Use grimace if obtunded 0=Normal symmetrical movement 1=Minor paralysis (asymmetry) 2=Partial paralysis (lower face) 3=Complete paralysis (upper and lower face) 0  Motor  0=No drift for 10/5 seconds 1=Drift, but does not hit bed 2=Some antigravity effort, hits  bed 3=No effort against gravity, limb falls 4=No movement 0=Amputation/joint fusion Right Arm 0      Leg 0    Left Arm 0     Leg 0  Limb Ataxia     - FNT/HTS 0=Absent or does not understand or paralyzed or amputation/joint fusion 1=Present in one limb 2=Present in two limbs 0  Sensory 0=Normal 1=Mild to moderate sensory loss 2=Severe to total sensory loss or coma/unresponsive 0  Best Language 0=No aphasia, normal 1=Mild to moderate aphasia 2=Severe aphasia 3=Mute, global aphasia, or coma/unresponsive 3  Dysarthria 0=Normal 1=Mild to moderate 2=Severe, unintelligible or mute/anarthric 0=intubated/unable to test 2  Extinction/Neglect 0=No abnormality 1=visual/tactile/auditory/spatia/personal inattention/Extinction to bilateral simultaneous stimulation 2=Profound neglect/extinction more than 1 modality  0  Total   9      Data Reviewed: CT Head Wo Contrast  Result Date: 04/03/2021 CLINICAL DATA:  Seizure, nontraumatic. EXAM: CT HEAD WITHOUT CONTRAST TECHNIQUE: Contiguous axial images were obtained from the base of the skull through the vertex without intravenous contrast. COMPARISON:  None. FINDINGS: Brain: No acute intracranial hemorrhage, midline shift or mass effect. No extra-axial fluid collection is identified. Gray-white matter differentiation is within normal limits and there is no hydrocephalus.  There is a CSF attenuation region in the posterior fossa in the midline, possible arachnoid cyst. Vascular: No hyperdense vessel or unexpected calcification. Skull: Normal. Negative for fracture or focal lesion. Sinuses/Orbits: No acute finding. Other: None. IMPRESSION: No acute intracranial process. Electronically Signed   By: Thornell Sartorius M.D.   On: 04/03/2021 20:03   MR BRAIN W WO CONTRAST  Result Date: 04/04/2021 CLINICAL DATA:  Seizure EXAM: MRI HEAD WITHOUT AND WITH CONTRAST TECHNIQUE: Multiplanar, multiecho pulse sequences of the brain and surrounding structures were obtained without and with intravenous contrast. CONTRAST:  36mL GADAVIST GADOBUTROL 1 MMOL/ML IV SOLN COMPARISON:   None. FINDINGS: Brain: No acute infarct, mass effect or extra-axial collection. Chronic microhemorrhage in the left pons. There is a cluster of serpiginous contrast enhancement at the left frontal pole. There is multifocal hyperintense T2-weighted signal within the white matter. Parenchymal volume and CSF spaces are normal. The midline structures are normal. Vascular: Major flow voids are preserved. Skull and upper cervical spine: Normal calvarium and skull base. Visualized upper cervical spine and soft tissues are normal. Sinuses/Orbits:No paranasal sinus fluid levels or advanced mucosal thickening. No mastoid or middle ear effusion. Normal orbits. IMPRESSION: 1. No acute intracranial abnormality. 2. Cluster of serpiginous contrast enhancement at the left frontal pole may be a small vascular malformation. CTA of the head may be helpful for further characterization. 3. Findings of chronic microvascular disease. Electronically Signed   By: Deatra Robinson M.D.   On: 04/04/2021 02:43   CT CEREBRAL PERFUSION W CONTRAST  Result Date: 04/08/2021 CLINICAL DATA:  Code stroke.  Bilateral cerebellar infarct. EXAM: CT ANGIOGRAPHY HEAD AND NECK CT PERFUSION BRAIN TECHNIQUE: Multidetector CT imaging of the head and neck was performed using the standard protocol during bolus administration of intravenous contrast. Multiplanar CT image reconstructions and MIPs were obtained to evaluate the vascular anatomy. Carotid stenosis measurements (when applicable) are obtained utilizing NASCET criteria, using the distal internal carotid diameter as the denominator. Multiphase CT imaging of the brain was performed following IV bolus contrast injection. Subsequent parametric perfusion maps were calculated using RAPID software. CONTRAST:  49mL OMNIPAQUE IOHEXOL 350 MG/ML SOLN; 18mL OMNIPAQUE IOHEXOL 350 MG/ML SOLN COMPARISON:  MRI head and CT head 04/08/2021 FINDINGS: CTA NECK FINDINGS Aortic arch: Standard branching. Imaged portion shows  no evidence of aneurysm or dissection. No significant stenosis of the major arch vessel origins. Right carotid system: Normal right carotid. Negative for atherosclerotic disease or dissection. Left carotid system: Normal left carotid. Negative for atherosclerotic disease or dissection. Tortuosity of the left internal carotid artery. Vertebral arteries: Normal vertebral arteries bilaterally. Skeleton: Cervical kyphosis which may be positional. No acute skeletal abnormality. Other neck: Negative Upper chest: Mild patchy airspace disease right upper lobe posteriorly. This may be scarring and is unchanged from chest CT of 11/10/2019 Review of the MIP images confirms the above findings CTA HEAD FINDINGS Anterior circulation: Cavernous carotid patent bilaterally. Anterior and middle cerebral arteries patent bilaterally without stenosis or large vessel occlusion. Review of the prior MRI with contrast 04/04/2021. This demonstrated serpiginous enhancement in the left anterior frontal lobe. No vascular malformation is seen in this area on CTA. This may have been due to cerebritis or luxury perfusion from recent infarction on the prior MRI. Recommend attention on MRI scheduled for later today. Posterior circulation: Both vertebral arteries patent to the basilar. Basilar widely patent. Posterior cerebral arteries widely patent bilaterally. Venous sinuses: Normal venous enhancement. Anatomic variants: None Review of the MIP images confirms the above findings  CT Brain Perfusion Findings: ASPECTS: 10 CBF (<30%) Volume: 0mL Perfusion (Tmax>6.0s) volume: 0mL Mismatch Volume: 0mL Infarction Location:None IMPRESSION: 1. CT perfusion negative for acute infarction. 2. Negative for intracranial large vessel occlusion 3. Normal carotid and vertebral arteries bilaterally. No significant atherosclerotic disease. 4. 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 Serpiginous contrast enhancement was seen on the recent MRI. This  enhancement may have been due to luxury perfusion from recent infarct or cerebritis. Attention on follow-up MRI scheduled for today. 5. These results were called by telephone at the time of interpretation on 04/08/2021 at 1:02 pm to provider Imo Cumbie , who verbally acknowledged these results. Electronically Signed   By: Marlan Palau M.D.   On: 04/08/2021 13:02   EEG adult  Result Date: 04/04/2021 Charlsie Quest, MD     04/04/2021 10:58 AM Patient Name: Genesis Novosad MRN: [id number] Epilepsy Attending: Charlsie Quest Referring Physician/Provider: Dr Marvel Plan Date: 04/04/2021 Duration: 31.10 mins Patient history: 55 year old female with new onset seizure.  EEG to evaluate for seizure. Level of alertness: Awake, asleep AEDs during EEG study: LEV, ativan Technical aspects: This EEG study was done with scalp electrodes positioned according to the 10-20 International system of electrode placement. Electrical activity was acquired at a sampling rate of  and reviewed with a high frequency filter of  and a low frequency filter of . EEG data were recorded continuously and digitally stored. Description: The posterior dominant rhythm consists of 9-10 Hz activity of moderate voltage (25-35 uV) seen predominantly in posterior head regions, symmetric and reactive to eye opening and eye closing. Sleep was characterized by vertex waves, sleep spindles (12 to 14 Hz), maximal frontocentral region.  Hyperventilation did not show any EEG change. Hyperventilation and photic stimulation were not performed.   IMPRESSION: This study is within normal limits. No seizures or epileptiform discharges were seen throughout the recording. Charlsie Quest   CT HEAD CODE STROKE WO CONTRAST  Result Date: 04/08/2021 CLINICAL DATA:  Code stroke. Acute neuro deficit. Slurred speech aphasia. EXAM: CT HEAD WITHOUT CONTRAST TECHNIQUE: Contiguous axial images were obtained from the base of the skull through the vertex without intravenous  contrast. COMPARISON:  CT head 04/03/2021 FINDINGS: Brain: No evidence of acute infarction, hemorrhage, hydrocephalus, extra-axial collection or mass lesion/mass effect. Vascular: Negative for hyperdense vessel Skull: Negative Sinuses/Orbits: Negative Other: None ASPECTS ([place]ber[place] Stroke Program Early CT Score) - Ganglionic level infarction (caudate, lentiform nuclei, internal capsule, insula, M1-M3 cortex): 7 - Supraganglionic infarction (M4-M6 cortex): 3 Total score (0-10 with 10 being normal): 10 IMPRESSION: 1. Negative CT head 2. ASPECTS is 10 3. Code stroke imaging results were communicated on 04/08/2021 at 12:26 pm to provider Roda Shutters via text page Electronically Signed   By: Marlan Palau M.D.   On: 04/08/2021 12:26    Assessment: 55 y.o. female with PMH of hypertension, smoker and recent seizure presenting to ED for speech arrest, staring off and altered mental status.  Patient recently admitted on 04/03/2021 for a witnessed GTC Seizure work-up including CT, EEG unremarkable.  MRI with and without contrast showed small area of T2 hyperintensity and cluster of serpiginous contrast enhancement at the left frontal Pole, ? small AVM. She was discharged with keppra  bid and compliant with medication per husband.  Pt today at 10 AM had again speech arrest, staring off, mute, not following commands. CT, CTA head and neck no AVM, CTP negative as well as MRI DWI all negative. Then EDP reported that RN witnessed  45sec of GTC. Put on keppra 1g load and will increase maintenance to 1g bid.   Discussed with Dr. Chestine Spore neuroradiology, pt no AVM at left frontal lobe but left frontal hyperintensity needs to have repeat MRI with and without contrast. Will order and also will do LTM EEG. Husband updated on the above management.   Plan: Hospitalist service admission to continue further seizure work up  LTM EEG requested Repeat MRI brain with and without contrast  Load with keppra 1g stat Increase home keppra from  500mg  bid to 1g bid Seizure precaution No driving until seizure free for 6 months and under physician's care Discussed with Dr. ED physician Will follow.  Thank you for this consultation and allowing Bernette Mayers to participate in the care of this patient.  [place], MD PhD Stroke Neurology 04/08/2021 1:52 PM

## 2021-04-08 NOTE — ED Provider Notes (Signed)
[facility] EMERGENCY DEPARTMENT Provider Note  CSN: [id number] Arrival date & time: 04/08/21 1203    History No chief complaint on file.   Alla Sloma is a 55 y.o. female with history of HTN, ?sarcoidosis was recently admitted for new seizure episode, had a neg workup including MRI and EEG, started on Keppra at discharge on 12/7. Per EMS today around 10am, she was with her husband, began digging through her purse and then became unresponsive, not talking. No shaking spells reported with this episode. She did not have any focal deficits today with EMS but given possible expressive aphasia, Code Stroke was called. Level 5 caveat applies.    Past Medical History:  Diagnosis Date  . Depression   . Hypertension   . Sarcoidosis     No past surgical history on file.  Family History  Problem Relation Age of Onset  . Heart attack Mother   . Liver cancer Father   . Diabetes Mellitus II Sister   . Kidney disease Sister     Social History   Tobacco Use  . Smoking status: Every Day    Packs/day: 0.50    Years: 37.00    Pack years: 18.50    Types: Cigarettes  . Smokeless tobacco: Never  Vaping Use  . Vaping Use: Never used  Substance Use Topics  . Alcohol use: Yes    Comment: occasionally  . Drug use: No     Home Medications Prior to Admission medications   Medication Sig Start Date End Date Taking? Authorizing Provider  amLODipine (NORVASC) 10 MG tablet Take 10 mg by mouth daily. 01/20/21   [provider]  aspirin 325 MG tablet Take 325 mg by mouth daily.    [provider]  dextromethorphan-guaiFENesin (MUCINEX DM) 30-600 MG per 12 hr tablet Take 1 tablet by mouth 2 (two) times daily as needed for cough.    [provider]  levETIRAcetam (KEPPRA) 500 MG tablet Take 1 tablet (500 mg total) by mouth 2 (two) times daily. 04/04/21   Leatha Gilding, MD  nicotine (NICODERM CQ - DOSED IN MG/24 HOURS) 14 mg/24hr patch Place 1 patch (14 mg total)  onto the skin daily as needed (smoking cessation). 04/04/21   Leatha Gilding, MD  sertraline (ZOLOFT) 100 MG tablet Take 100 mg by mouth daily. 12/16/19   [provider]     Allergies    Patient has no known allergies.   Review of Systems   Review of Systems Unable to assess due to mental status.    Physical Exam BP (!) 179/108   Pulse (!) 103   Temp 97.9 F (36.6 C) (Oral)   Resp 14   Ht 5\' 4"  (1.626 m)   Wt 51 kg   SpO2 100%   BMI 19.31 kg/m   Physical Exam Vitals and nursing note reviewed.  Constitutional:      Appearance: Normal appearance.  HENT:     Head: Normocephalic and atraumatic.     Nose: Nose normal.     Mouth/Throat:     Mouth: Mucous membranes are moist.  Eyes:     Extraocular Movements: Extraocular movements intact.     Conjunctiva/sclera: Conjunctivae normal.  Cardiovascular:     Rate and Rhythm: Normal rate.  Pulmonary:     Effort: Pulmonary effort is normal.     Breath sounds: Normal breath sounds.  Abdominal:     General: Abdomen is flat.     Palpations: Abdomen is soft.  Tenderness: There is no abdominal tenderness.  Musculoskeletal:        General: No swelling. Normal range of motion.     Cervical back: Neck supple.  Skin:    General: Skin is warm and dry.  Neurological:     Mental Status: She is alert.     Comments: Does no speak or answer questions, difficulty to fully assess as she does not participate consistently with exam but she is able to move all extremities.   Psychiatric:     Comments: tearful     ED Results / Procedures / Treatments   Labs (all labs ordered are listed, but only abnormal results are displayed) Labs Reviewed  APTT - Abnormal; Notable for the following components:      Result Value   aPTT 38 (*)    All other components within normal limits  CBC - Abnormal; Notable for the following components:   WBC 3.4 (*)    All other components within normal limits  DIFFERENTIAL - Abnormal; Notable  for the following components:   Lymphs Abs 0.6 (*)    All other components within normal limits  I-STAT CHEM 8, ED - Abnormal; Notable for the following components:   Calcium, Ion 1.09 (*)    Hemoglobin 15.3 (*)    All other components within normal limits  I-STAT BETA HCG BLOOD, ED ([facility], [facility], [facility] ONLY) - Abnormal; Notable for the following components:   I-stat hCG, quantitative 5.7 (*)    All other components within normal limits  RESP PANEL BY RT-PCR (FLU A&B, COVID) [id number]  PROTIME-INR  COMPREHENSIVE METABOLIC PANEL  CBG MONITORING, ED    EKG EKG Interpretation  Date/Time:  Sunday April 08 2021 13:04:57 EST Ventricular Rate:  95 PR Interval:  227 QRS Duration: 91 QT Interval:  369 QTC Calculation: 464 R Axis:   -62 Text Interpretation: Sinus rhythm Prolonged PR interval Inferior infarct, old Anteroseptal infarct, age indeterminate No significant change since last tracing Confirmed by Susy Frizzle [phone]) on 04/08/2021 1:16:47 PM  Radiology CT CEREBRAL PERFUSION W CONTRAST  Result Date: 04/08/2021 CLINICAL DATA:  Code stroke.  Bilateral cerebellar infarct. EXAM: CT ANGIOGRAPHY HEAD AND NECK CT PERFUSION BRAIN TECHNIQUE: Multidetector CT imaging of the head and neck was performed using the standard protocol during bolus administration of intravenous contrast. Multiplanar CT image reconstructions and MIPs were obtained to evaluate the vascular anatomy. Carotid stenosis measurements (when applicable) are obtained utilizing NASCET criteria, using the distal internal carotid diameter as the denominator. Multiphase CT imaging of the brain was performed following IV bolus contrast injection. Subsequent parametric perfusion maps were calculated using RAPID software. CONTRAST:  44mL OMNIPAQUE IOHEXOL 350 MG/ML SOLN; 75mL OMNIPAQUE IOHEXOL 350 MG/ML SOLN COMPARISON:  MRI head and CT head 04/08/2021 FINDINGS: CTA NECK FINDINGS Aortic arch: Standard branching. Imaged portion shows no  evidence of aneurysm or dissection. No significant stenosis of the major arch vessel origins. Right carotid system: Normal right carotid. Negative for atherosclerotic disease or dissection. Left carotid system: Normal left carotid. Negative for atherosclerotic disease or dissection. Tortuosity of the left internal carotid artery. Vertebral arteries: Normal vertebral arteries bilaterally. Skeleton: Cervical kyphosis which may be positional. No acute skeletal abnormality. Other neck: Negative Upper chest: Mild patchy airspace disease right upper lobe posteriorly. This may be scarring and is unchanged from chest CT of 11/10/2019 Review of the MIP images confirms the above findings CTA HEAD FINDINGS Anterior circulation: Cavernous carotid patent bilaterally. Anterior and middle cerebral arteries patent bilaterally without  stenosis or large vessel occlusion. Review of the prior MRI with contrast 04/04/2021. This demonstrated serpiginous enhancement in the left anterior frontal lobe. No vascular malformation is seen in this area on CTA. This may have been due to cerebritis or luxury perfusion from recent infarction on the prior MRI. Recommend attention on MRI scheduled for later today. Posterior circulation: Both vertebral arteries patent to the basilar. Basilar widely patent. Posterior cerebral arteries widely patent bilaterally. Venous sinuses: Normal venous enhancement. Anatomic variants: None Review of the MIP images confirms the above findings CT Brain Perfusion Findings: ASPECTS: 10 CBF (<30%) Volume: 24mL Perfusion (Tmax>6.0s) volume: 46mL Mismatch Volume: 74mL Infarction Location:None IMPRESSION: 1. CT perfusion negative for acute infarction. 2. Negative for intracranial large vessel occlusion 3. Normal carotid and vertebral arteries bilaterally. No significant atherosclerotic disease. 4. 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 Serpiginous contrast enhancement was seen on the recent MRI. This  enhancement may have been due to luxury perfusion from recent infarct or cerebritis. Attention on follow-up MRI scheduled for today. 5. These results were called by telephone at the time of interpretation on 04/08/2021 at 1:02 pm to provider XU , who verbally acknowledged these results. Electronically Signed   By: Marlan Palau M.D.   On: 04/08/2021 13:02   CT HEAD CODE STROKE WO CONTRAST  Result Date: 04/08/2021 CLINICAL DATA:  Code stroke. Acute neuro deficit. Slurred speech aphasia. EXAM: CT HEAD WITHOUT CONTRAST TECHNIQUE: Contiguous axial images were obtained from the base of the skull through the vertex without intravenous contrast. COMPARISON:  CT head 04/03/2021 FINDINGS: Brain: No evidence of acute infarction, hemorrhage, hydrocephalus, extra-axial collection or mass lesion/mass effect. Vascular: Negative for hyperdense vessel Skull: Negative Sinuses/Orbits: Negative Other: None ASPECTS (Alberta Stroke Program Early CT Score) - Ganglionic level infarction (caudate, lentiform nuclei, internal capsule, insula, M1-M3 cortex): 7 - Supraganglionic infarction (M4-M6 cortex): 3 Total score (0-10 with 10 being normal): 10 IMPRESSION: 1. Negative CT head 2. ASPECTS is 10 3. Code stroke imaging results were communicated on 04/08/2021 at 12:26 pm to provider Roda Shutters via text page Electronically Signed   By: Marlan Palau M.D.   On: 04/08/2021 12:26    Procedures .Critical Care Performed by: Pollyann Savoy, MD Authorized by: Pollyann Savoy, MD   Critical care provider statement:    Critical care time (minutes):  40   Critical care time was exclusive of:  Separately billable procedures and treating other patients   Critical care was necessary to treat or prevent imminent or life-threatening deterioration of the following conditions:  CNS failure or compromise   Critical care was time spent personally by me on the following activities:  Development of treatment plan with patient or surrogate,  discussions with consultants, evaluation of patient's response to treatment, examination of patient, ordering and review of laboratory studies, ordering and review of radiographic studies, ordering and performing treatments and interventions, pulse oximetry, re-evaluation of patient's condition and review of old charts   Care discussed with: admitting provider    Medications Ordered in the ED Medications  levETIRAcetam (KEPPRA) IVPB 1000 mg/100 mL premix (1,000 mg Intravenous New Bag/Given 04/08/21 1346)  sodium chloride flush (NS) 0.9 % injection 3 mL (3 mLs Intravenous Given 04/08/21 1304)  iohexol (OMNIPAQUE) 350 MG/ML injection 50 mL (50 mLs Intravenous Contrast Given 04/08/21 1237)  iohexol (OMNIPAQUE) 350 MG/ML injection 40 mL (40 mLs Intravenous Contrast Given 04/08/21 1240)     MDM Rules/Calculators/A&P MDM  Patient arrives as a Code Stroke, Neurology  team at bedside promptly on patient arrival, airway is intact and taken directly to CT.  ED Course  I have reviewed the triage vital signs and the nursing notes.  Pertinent labs & imaging results that were available during my care of the patient were reviewed by me and considered in my medical decision making (see chart for details).  Clinical Course as of 04/08/21 1420  Sun Apr 08, 2021  1229 CT head is neg. Per Dr. Roda Shutters, neurology at bedside, will proceed with CTA and CTP.  [CS]  1235 Istat is unremarkable.  [CS]  1307 CTA and MRI are neg. No signs of stroke. Dr. Roda Shutters recommends stat EEG. If that is negative and she returns to baseline she may be able to go home. Otherwise will plan on admitting her overnight.  [CS]  1332 Patient had a brief, 45 second, tonic-clonic seizure, witnessed by RN but stopped before I got to the room. Post-ictal on my arrival to bedside. She had brief hypoxia immediately post-seizure improved with NRB. Will continue to monitor, will inform Neuro for recommendations.  [CS]  1335 Dr. Roda Shutters recommends loading with  Keppra 1g now and then 1g PO BID. He recommends admission for MRI brain with and without contrast and for long term EEG. Hospitalist paged.  [CS]  1345 CBC unremarkable.  [CS]  1350 Spoke with Dr Arlyss Queen, Hospitalist, who will evaluate for admission.  [CS]  1420 Patient is restless, trying to climb out of bed. Will avoid benzos while trying to get EEG, restraints ordered to facilitate getting the EEG. Dr. Roda Shutters aware.  [CS]    Clinical Course User Index [CS] Pollyann Savoy, MD    Final Clinical Impression(s) / ED Diagnoses Final diagnoses:  Seizure [facility])    Rx / DC Orders ED Discharge Orders     None        Pollyann Savoy, MD 04/08/21 1352

## 2021-04-08 NOTE — Procedures (Signed)
Routine EEG Report  Martha Schneider is a 55 y.o. female with a history of seizures who is undergoing an EEG to evaluate for seizures.  Report: This EEG was acquired with electrodes placed according to the International 10-20 electrode system (including Fp1, Fp2, F3, F4, C3, C4, P3, P4, O1, O2, T3, T4, T5, T6, A1, A2, Fz, Cz, Pz). The following electrodes were missing or displaced: none.  The occipital dominant rhythm was 6-7 Hz with intermittent more pronounced diffuse slowing. This activity is reactive to stimulation. Drowsiness was manifested by background fragmentation; deeper stages of sleep were not identified. There was no focal slowing. There are intermittent, at times frequent, sharp waves with increased rhythmicity of background activity over the left frontal region. There is EMG artifact throughout the recording which somewhat limits interpretation. With this caveat, there were no clear electrographic seizures identified. Photic stimulation and hyperventilation were not performed.  Impression and clinical correlation: This EEG was obtained while awake and drowsy and is abnormal due to: - Mild-to-moderate diffuse slowing, indicative of global cerebral dysfunction - Intermittent, at times frequent, sharp waves with increased rhythmicity of background activity over the left frontal region, suggestive of increased epileptogenic potential in this region - Recording was degraded by EMG artifact, with that caveat no definitive electrographic seizures were identified  Bing Neighbors, MD [facility] [phone]  If 7pm- 7am, please page neurology on call as listed in AMION.

## 2021-04-08 NOTE — Progress Notes (Signed)
LTM hooked at bedside on patient following MRI. No skin breakdown noted. Results pending

## 2021-04-08 NOTE — ED Notes (Signed)
EEG at bedside.

## 2021-04-08 NOTE — ED Notes (Signed)
Rya Lequita Halt (Sister) of Kariann called asking for an update. Rya stated she is a Engineer, civil (consulting) and when like to when more details on the PT. Her number is [phone].

## 2021-04-08 NOTE — Code Documentation (Signed)
Stroke Response Nurse Documentation Code Documentation  Martha Schneider is a 55 y.o. female arriving to [facility] ED via [facility] on 04/08/21 with past medical hx of seizure, HTN, sarcoidosis, and chronic leukopenia. On No antithrombotic. Code stroke was activated by EMS (EMS, ED).   Patient from home where she was LKW at 1000 and now complaining of aphasia .  Stroke team at the bedside on patient arrival. Labs drawn and patient cleared for CT by Dr. Bernette Mayers. Patient to CT with team. NIHSS 6, see documentation for details and code stroke times. Patient with Expressive aphasia  on exam. The following imaging was completed:  CT and CTA and MRI (CT, CTA head and neck, CTP). Patient is not a candidate for IV Thrombolytic due to no stroke on imaging.   .   Bedside handoff with ED RN Joni Reining.    Martha Schneider  Stroke Response RN

## 2021-04-08 NOTE — ED Notes (Signed)
Tresea Mall (Spouse) called asking for an update on Martha Schneider. Call back at [phone]

## 2021-04-09 DIAGNOSIS — I10 Essential (primary) hypertension: Secondary | ICD-10-CM

## 2021-04-09 DIAGNOSIS — F1010 Alcohol abuse, uncomplicated: Secondary | ICD-10-CM

## 2021-04-09 DIAGNOSIS — D72819 Decreased white blood cell count, unspecified: Secondary | ICD-10-CM

## 2021-04-09 LAB — RAPID URINE DRUG SCREEN, HOSP PERFORMED
Amphetamines: NOT DETECTED
Barbiturates: NOT DETECTED
Benzodiazepines: NOT DETECTED
Cocaine: NOT DETECTED
Opiates: NOT DETECTED
Tetrahydrocannabinol: NOT DETECTED

## 2021-04-09 LAB — URINALYSIS, ROUTINE W REFLEX MICROSCOPIC
Bilirubin Urine: NEGATIVE
Glucose, UA: NEGATIVE mg/dL
Hgb urine dipstick: NEGATIVE
Ketones, ur: 20 mg/dL — AB
Leukocytes,Ua: NEGATIVE
Nitrite: NEGATIVE
Protein, ur: NEGATIVE mg/dL
Specific Gravity, Urine: 1.019 (ref 1.005–1.030)
pH: 7 (ref 5.0–8.0)

## 2021-04-09 LAB — CBC
HCT: 40.3 % (ref 36.0–46.0)
Hemoglobin: 13.3 g/dL (ref 12.0–15.0)
MCH: 29 pg (ref 26.0–34.0)
MCHC: 33 g/dL (ref 30.0–36.0)
MCV: 87.8 fL (ref 80.0–100.0)
Platelets: 230 10*3/uL (ref 150–400)
RBC: 4.59 MIL/uL (ref 3.87–5.11)
RDW: 13.5 % (ref 11.5–15.5)
WBC: 3 10*3/uL — ABNORMAL LOW (ref 4.0–10.5)
nRBC: 0 % (ref 0.0–0.2)

## 2021-04-09 LAB — TSH: TSH: 3.905 u[IU]/mL (ref 0.350–4.500)

## 2021-04-09 MED ORDER — SODIUM CHLORIDE 0.9 % IV SOLN
20.0000 mg/kg | Freq: Once | INTRAVENOUS | Status: AC
  Administered 2021-04-09: 1020 mg via INTRAVENOUS
  Filled 2021-04-09: qty 20.4

## 2021-04-09 MED ORDER — LEVETIRACETAM IN NACL 1500 MG/100ML IV SOLN
1500.0000 mg | Freq: Two times a day (BID) | INTRAVENOUS | Status: DC
Start: 2021-04-09 — End: 2021-04-11
  Administered 2021-04-09 – 2021-04-11 (×5): 1500 mg via INTRAVENOUS
  Filled 2021-04-09 (×7): qty 100

## 2021-04-09 MED ORDER — NICOTINE 14 MG/24HR TD PT24
14.0000 mg | MEDICATED_PATCH | Freq: Every day | TRANSDERMAL | Status: DC
  Administered 2021-04-09 – 2021-04-11 (×3): 14 mg via TRANSDERMAL
  Filled 2021-04-09 (×3): qty 1

## 2021-04-09 MED ORDER — ONDANSETRON HCL 4 MG/2ML IJ SOLN
4.0000 mg | Freq: Four times a day (QID) | INTRAMUSCULAR | Status: DC | PRN
  Administered 2021-04-09: 4 mg via INTRAVENOUS
  Filled 2021-04-09: qty 2

## 2021-04-09 NOTE — Plan of Care (Signed)
Contacted by patient RN.  Patient had another seizure lasting 2 minutes.  Being given 2 mg IV Ativan. I have asked them to give the 1500 mg Keppra IV dose now. I will give her an additional antiepileptic-loaded with fosphenytoin Continue LTM EEG Will follow  -- Milon Dikes, MD Neurologist [facility] Pager: [phone]

## 2021-04-09 NOTE — ED Notes (Signed)
EEG staff arriving to room. EEG button pressed. [facility] EEG monitor phone call received.

## 2021-04-09 NOTE — ED Notes (Addendum)
Neuro at [facility]. Husband at [facility] and updated. Pt remains postictal and sedated. VSS. SPO2 100% on 2L [place]. Keppra infusing, phosphenitoin pending.

## 2021-04-09 NOTE — ED Notes (Addendum)
Called to room by husband for coughing gagging vomiting retching. Pt verbalizes wanting water. Kept NPO at this time. Repositioned to upright, 75 degrees. Using emesis bag. Airway clear.

## 2021-04-09 NOTE — Procedures (Addendum)
Patient Name: Martha Schneider  MRN: [id number]  Epilepsy Attending: Charlsie Quest  Referring Physician/Provider: Dr Marvel Plan Duration: 04/08/2021 1855 to 04/09/2021 1855  Patient history: 55 y.o. female with PMH of hypertension, smoker and recent seizure presenting to ED for speech arrest, staring off and altered mental status.EEG to evaluate for seizure  Level of alertness: Awake  AEDs during EEG study: LEV  Technical aspects: This EEG study was done with scalp electrodes positioned according to the 10-20 International system of electrode placement. Electrical activity was acquired at a sampling rate of 500Hz  and reviewed with a high frequency filter of 70Hz  and a low frequency filter of 1Hz . EEG data were recorded continuously and digitally stored.   Description: No clear posterior dominant rhythm was seen.  EEG showed continuous generalized 3-5Hz  theta- delta slowing.    At the beginning of the study, lateralized discharges were noted in left hemisphere at 2-2.5Hz  with superimposed rhythmicity.  Gradually, EEG started showing lateralized periodic discharges with overriding rhythmicity and frequency fluctuating between 2 to 3 Hz lasting 3 to 10 seconds followed by 1 to 2 seconds of generalized EEG attenuation.  This EEG was consistent with brief ictal-interictal rhythmic discharges  (BIRDS).  1 seizure was noted on 04/09/2021 at 0917.  During the seizure, patient initially did not have any clinical signs.  She was lying in bed, looking at family members at bedside, occasionally moving her left arm and looking at it (did not look like typical automatism).  At 919, patient made a grunting sound followed by gaze deviation to the right as well as forced head deviation to the right.  This was followed by whole body stiffening and arching of back. Patient's family member came in from the camera therefore, it was difficult to view the patient but it looked like patient was having a generalized  tonic-clonic seizure.  Concomitant EEG initially showed 2.5 Hz periodic discharges in left hemisphere, maximal left frontotemporal region which gradually evolved in morphology and frequency and then involved the right hemisphere.  Total duration of seizure was 3 minutes.  Hyperventilation and photic stimulation were not performed.     ABNORMALITY -Focal seizure, left frontotemporal region -Brief ictal-interictal rhythmic discharges, left hemisphere - Lateralized periodic discharges with superimposed rhythmicity ( LPD +R) left hemisphere - Continuous slow, generalized  IMPRESSION: This study showed one focal motor seizure as described above on 04/09/2021 at 0917, arising from left frontotemporal region, lasting about 3 minutes.  EEG also showed lateralized periodic discharges with superimposed rhythmicity as well as brief ictal-interictal rhythmic discharges in left hemisphere which is on the ictal--interictal continuum with high potential for seizure recurrance.   Additionally, EEG is suggestive of moderate to severe diffuse encephalopathy, nonspecific to etiology.  Dr was notified.  Nicky Milhouse 14/03/2021

## 2021-04-09 NOTE — ED Notes (Signed)
Sz resolving, pt postictal, ativan given, RT switching pt over to , remains 100%, husband present.

## 2021-04-09 NOTE — ED Notes (Signed)
Called to room for sz activity. Seizure noted, lasted  ~1 minute. Bagged/BVM for ~1 minute. RT called. Admitting and neuro MD messaged. Ativan order in place.

## 2021-04-09 NOTE — ED Notes (Addendum)
EEG at [facility] reprogramming/ resetting EEG. Family at [facility]. Pt alert, NAD, calm, interactive, and restless and fidgety. Follows commands. Working on Museum/gallery curator. Pulse ox moved to toe.

## 2021-04-09 NOTE — ED Notes (Signed)
Admitting MD in to see, at BS.  

## 2021-04-09 NOTE — ED Notes (Signed)
Zofran given, HOB raised to 90 degrees, husband at [facility] instructed not to swab mouth, remains NPO. Appears and "Feels better".

## 2021-04-09 NOTE — ED Notes (Signed)
[facility]/EEG monitor phone call reports pt appears to be vomiting, responded to room, pt sitting upright, airway patent, alert, NAD, calm, interactive, resps e/u, VSS, HR remains tachy 124, husband at [facility]. Both acknowledge that she was gagged and retching which has resolved. Denies nausea. Will continue to monitor.

## 2021-04-09 NOTE — Progress Notes (Signed)
Neurology Progress Note   S:// No clinical seizures over the past overnight.    O:// Current vital signs: BP (!) 144/89   Pulse 88   Temp 97.9 F (36.6 C) (Oral)   Resp 19   Ht _0  (1.626 m)   Wt 51 kg   SpO2 97%   BMI 19.31 kg/m  Vital signs in last 24 hours: Temp:  [97.9 F (36.6 C)-98.6 F (37 C)] 97.9 F (36.6 C) (12/11 1306) Pulse Rate:  [51-103] 88 (12/12 0855) Resp:  [12-19] 19 (12/12 0855) BP: (117-186)/(80-116) 144/89 (12/12 0850) SpO2:  [92 %-100 %] 97 % (12/12 0855) Weight:  [51 kg] 51 kg (12/11 1251)  General: Awake alert in no distress HEENT: Normocephalic/atraumatic CVs: Regular rhythm Abdomen nondistended nontender Respiratory: Breathing well saturating normally on room air Extremities warm well perfused Neurological exam Awake alert oriented to self. When asked what month is it, she said 13.  When asked how old she is she is a 31.  When given the options to pick a current month-she picked December correctly.  When asked what holiday is coming in, she said January. When asked to name objects-she said "that is okay" or "you know". Mimic commands but did not follow simple commands consistently Cranial nerves: 2-12 intact Motor examination with symmetric strength in all 4 extremities without drift Sensation intact to touch all over  Medications  Current Facility-Administered Medications:    0.9 %  sodium chloride infusion, 75 mL/hr, Intravenous, Continuous, Smith, Rondell A, MD, Last Rate: 75 mL/hr at 04/08/21 1555, 75 mL/hr at 04/08/21 1555   acetaminophen (TYLENOL) tablet 650 mg, 650 mg, Oral, Q6H PRN **OR** acetaminophen (TYLENOL) suppository 650 mg, 650 mg, Rectal, Q6H PRN, Tamala Julian, Rondell A, MD   albuterol (PROVENTIL) (2.5 MG/3ML) 0.083% nebulizer solution 2.5 mg, 2.5 mg, Nebulization, Q6H PRN, Tamala Julian, Rondell A, MD   chlorhexidine gluconate (MEDLINE KIT) (PERIDEX) 0.12 % solution 15 mL, 15 mL, Mouth Rinse, BID, Smith, Rondell A, MD   enoxaparin  (LOVENOX) injection 40 mg, 40 mg, Subcutaneous, Q24H, Smith, Rondell A, MD, 40 mg at 16/60/63 0160   folic acid (FOLVITE) tablet 1 mg, 1 mg, Oral, Daily, Smith, Rondell A, MD   hydrALAZINE (APRESOLINE) injection 10 mg, 10 mg, Intravenous, Q4H PRN, Tamala Julian, Rondell A, MD   levETIRAcetam (KEPPRA) IVPB 1000 mg/100 mL premix, 1,000 mg, Intravenous, Q12H, Fuller Plan A, MD, Stopped at 04/08/21 2342   LORazepam (ATIVAN) injection 1-2 mg, 1-2 mg, Intravenous, Q2H PRN, Smith, Rondell A, MD   LORazepam (ATIVAN) tablet 1-4 mg, 1-4 mg, Oral, Q1H PRN **OR** LORazepam (ATIVAN) injection 1-4 mg, 1-4 mg, Intravenous, Q1H PRN, Tamala Julian, Rondell A, MD   MEDLINE mouth rinse, 15 mL, Mouth Rinse, 10 times per day, Fuller Plan A, MD, 15 mL at 04/08/21 1751   multivitamin with minerals tablet 1 tablet, 1 tablet, Oral, Daily, Fuller Plan A, MD, 1 tablet at 04/08/21 1558   nicotine (NICODERM CQ - dosed in mg/24 hours) patch 14 mg, 14 mg, Transdermal, Daily PRN, Tamala Julian, Rondell A, MD   pantoprazole (PROTONIX) injection 40 mg, 40 mg, Intravenous, Q24H, Smith, Rondell A, MD, 40 mg at 04/08/21 1614   sodium chloride flush (NS) 0.9 % injection 3 mL, 3 mL, Intravenous, Q12H, Smith, Rondell A, MD, 3 mL at 04/08/21 1513   thiamine tablet 100 mg, 100 mg, Oral, Daily **OR** thiamine (B-1) injection 100 mg, 100 mg, Intravenous, Daily, Tamala Julian, Rondell A, MD, 100 mg at 04/08/21 1555  Current Outpatient Medications:  amLODipine (NORVASC) 10 MG tablet, Take 10 mg by mouth daily., Disp: , Rfl:    aspirin 325 MG tablet, Take 325 mg by mouth daily., Disp: , Rfl:    levETIRAcetam (KEPPRA) 500 MG tablet, Take 1 tablet (500 mg total) by mouth 2 (two) times daily., Disp: 60 tablet, Rfl: 0   sertraline (ZOLOFT) 100 MG tablet, Take 100 mg by mouth daily., Disp: , Rfl:    dextromethorphan-guaiFENesin (MUCINEX DM) 30-600 MG per 12 hr tablet, Take 1 tablet by mouth 2 (two) times daily as needed for cough. (Patient not taking: Reported on  04/08/2021), Disp: , Rfl:    nicotine (NICODERM CQ - DOSED IN MG/24 HOURS) 14 mg/24hr patch, Place 1 patch (14 mg total) onto the skin daily as needed (smoking cessation)., Disp: 28 patch, Rfl: 0 Labs CBC    Component Value Date/Time   WBC 3.0 (L) 04/09/2021 0529   RBC 4.59 04/09/2021 0529   HGB 13.3 04/09/2021 0529   HGB 13.6 05/10/2020 1037   HCT 40.3 04/09/2021 0529   PLT 230 04/09/2021 0529   PLT 234 05/10/2020 1037   MCV 87.8 04/09/2021 0529   MCH 29.0 04/09/2021 0529   MCHC 33.0 04/09/2021 0529   RDW 13.5 04/09/2021 0529   LYMPHSABS 0.6 (L) 04/08/2021 1206   MONOABS 0.5 04/08/2021 1206   EOSABS 0.0 04/08/2021 1206   BASOSABS 0.0 04/08/2021 1206    CMP     Component Value Date/Time   NA 141 04/08/2021 1212   K 4.2 04/08/2021 1212   CL 102 04/08/2021 1212   CO2 29 04/08/2021 1206   GLUCOSE 84 04/08/2021 1212   BUN 11 04/08/2021 1212   CREATININE 0.50 04/08/2021 1212   CREATININE 0.78 05/10/2020 1037   CREATININE 0.76 12/17/2013 1653   CALCIUM 8.9 04/08/2021 1206   PROT 8.3 (H) 04/08/2021 1206   ALBUMIN 4.4 04/08/2021 1206   AST 32 04/08/2021 1206   AST 26 05/10/2020 1037   ALT 18 04/08/2021 1206   ALT 14 05/10/2020 1037   ALKPHOS 69 04/08/2021 1206   BILITOT 0.6 04/08/2021 1206   BILITOT 0.6 05/10/2020 1037   GFRNONAA >60 04/08/2021 1206   GFRNONAA >60 05/10/2020 1037   GFRNONAA >89 12/17/2013 1653   GFRAA >60 10/20/2019 1028   GFRAA >89 12/17/2013 1653   MR brain without contrast  Imaging I have reviewed images in epic and the results pertinent to this consultation are: MR brain DWI negative.  Other sequences not performed. Prior MRI few days ago with T2 hyperintensity and cluster of serpiginous contrast enhancement in the left frontal pole, with CT angiography not very consistent with an AVM. Recommend repeating MRI brain with without contrast.  Assessment: 55 year old past history of hypertension, smoker, recent seizure where she came in with speech  arrest in setting of spell and altered mental status.  Recently admitted to 10/16/2020 with a witnessed generalized tonic-clonic seizure-seizure work-up including CT, EEG was unremarkable. MRI with and without contrast at that time showed a small area of T2 hyperintensity in cluster of serpiginous contrast-enhancement at the left frontal pole with?  AVM. She was discharged with Keppra 500 twice daily and was compliant with her medication per her husband. Yesterday, around 10 AM she had speech arrest, staring off, becoming nonverbal and not following commands.  CT, CTA head and neck with no evidence of AVM.  CT perfusion negative as well.  MRI-stroke protocol with DWI only was done which was negative for an acute stroke. She was given  additional Keppra and maintenance dose of Keppra was increased to 1 g twice daily. She is of Dr. Delsa Bern EEG. Read of the LTM EEG from overnight pending at this time On examination, she is still not back to her baseline. Suspect prolonged postictal state versus ongoing subclinical seizure activity.  Recommendations: Currently on 1 g Keppra twice daily. Will increase Keppra to 1500 twice daily -ordered. Continue LTM EEG When continuous EEG is completed, will need an MRI brain with and without contrast for further evaluation of the serpiginous enhancement that was seen on 04/03/2021. Will follow LTM EEG. Maintain seizure precautions including no driving for 6 months unless seizure-free. Plan relayed to Dr. Maryland Pink, hospitalist, via secure chat   -- Amie Portland, MD Neurologist [facility] Pager: [phone]

## 2021-04-09 NOTE — ED Notes (Signed)
Up to floor with SWOT. Husband at [facility]. No changes. No sz activity. Sz pads in place. Alert, NAD, calm. A&Ox3-4 with some confusion. Depends on question asked.

## 2021-04-09 NOTE — Progress Notes (Signed)
[facility] PROGRESS NOTE   Martha Schneider INO:[id number] DOB: [birth year] DOA: 04/08/2021  PCP: Lujean Amel, MD  Brief History/Interval Summary: 55 y.o. female with medical history significant for hypertension, chronic leukopenia, sarcoidosis, tobacco abuse, and depression who presented after being acutely altered on the morning of admission.  Patient was recently hospitalized and was discharged on December 7 after being managed for new onset seizure disorder.  Patient's husband mentioned compliance with her Keppra.  Came in with altered mental status as mentioned earlier.  Initially there was concern for stroke but it was felt that patient was actually experiencing seizures.  Neurology was consulted.  Keppra dose was increased.  Placed on long-term EEG monitoring and hospitalized.    Reason for Visit: Seizure disorder  Consultants: Neurology  Procedures: Continuous EEG    Subjective/Interval History: Patient is pleasantly confused.  Not able to answer all questions appropriately.  Distracted.  Husband is at bedside.     Assessment/Plan:  Seizure disorder with recurrent seizures Recently discharged on December 7 on Keppra.  Appears to be experiencing seizure activity.  Placed on long-term monitoring.  Neurology is following.  Dose of Keppra has been increased. MRI without contrast did not show any acute stroke.  Influenza and COVID-19 PCR negative. Brain imaging studies have raised concern for abnormality in the left frontal area.  Initially thought to be vascular malformation, but not thought to be the same based on CT angiogram.  Neurology has recommended repeating MRI with and without contrast.  This is still pending. Recently done urine drug screen was negative as well. Patient should not be driving until seizure-free for 6 months and under physician's care.  Hypertensive urgency Initial blood pressures were elevated up to 186/110.  She is on amlodipine prior to  admission.  Currently appears to be not on any blood pressure medications.  Blood pressures have improved in the last 12 hours.  Continue to monitor.  Transient hypoxia Secondary to seizure.  Stable now.  Hypocalcemia Was given calcium gluconate for low ionized calcium.  Check calcium levels tomorrow.  Alcohol abuse Apparently drinks 4-5 beers every day.  Could be contributing to her seizures.  Currently on CIWA protocol.  Thiamine multivitamins and folic acid.  Chronic leukopenia Stable.  History of depression On Zoloft  History of sarcoidosis Follow-up with pulmonology  Tobacco abuse Nicotine patch  DVT Prophylaxis: Lovenox Code Status: Full code Family Communication: Discussed with patient's husband Disposition Plan:   Hopefully return home when improved  Status is: Inpatient  Remains inpatient appropriate because: Seizures requiring continuous EEG monitoring    Medications: Scheduled:  chlorhexidine gluconate (MEDLINE KIT)  15 mL Mouth Rinse BID   enoxaparin (LOVENOX) injection  40 mg Subcutaneous S96G   folic acid  1 mg Oral Daily   mouth rinse  15 mL Mouth Rinse 10 times per day   multivitamin with minerals  1 tablet Oral Daily   pantoprazole (PROTONIX) IV  40 mg Intravenous Q24H   sodium chloride flush  3 mL Intravenous Q12H   thiamine  100 mg Oral Daily   Or   thiamine  100 mg Intravenous Daily   Continuous:  sodium chloride 75 mL/hr (04/08/21 1555)   levETIRAcetam Stopped (04/08/21 2342)   EZM:[id number] **OR** acetaminophen, albuterol, hydrALAZINE, LORazepam, LORazepam **OR** LORazepam, nicotine  Antibiotics: Anti-infectives (From admission, onward)    None       Objective:  Vital Signs  Vitals:   04/09/21 0815 04/09/21 0830 04/09/21 0850 04/09/21 0855  BP: Marland Kitchen)  135/93 (!) 147/97 (!) 144/89   Pulse:  (!) 101 95 88  Resp: 18 17 17 19   Temp:      TempSrc:      SpO2:   96% 97%  Weight:      Height:        Intake/Output Summary (Last  24 hours) at 04/09/2021 0900 Last data filed at 04/08/2021 1703 Gross per 24 hour  Intake 147.27 ml  Output --  Net 147.27 ml   Filed Weights   04/08/21 1200 04/08/21 1251  Weight: 51 kg 51 kg    General appearance: Awake alert.  In no distress.  Distracted Resp: Clear to auscultation bilaterally.  Normal effort Cardio: S1-S2 is normal regular.  No S3-S4.  No rubs murmurs or bruit GI: Abdomen is soft.  Nontender nondistended.  Bowel sounds are present normal.  No masses organomegaly Extremities: No edema.  Moving all of her extremities Neurologic: Disoriented.  No focal neurological deficits.    Lab Results:  Data Reviewed: I have personally reviewed following labs and imaging studies  CBC: Recent Labs  Lab 04/03/21 1820 04/04/21 0535 04/08/21 1206 04/08/21 1212 04/09/21 0529  WBC 2.7* 2.4* 3.4*  --  3.0*  NEUTROABS 1.6* 1.1* 2.3  --   --   HGB 11.7* 12.3 14.1 15.3* 13.3  HCT 36.6 36.5 43.1 45.0 40.3  MCV 89.9 87.5 89.0  --  87.8  PLT 210 210 240  --  785    Basic Metabolic Panel: Recent Labs  Lab 04/03/21 1820 04/04/21 0535 04/08/21 1206 04/08/21 1212  NA 138 135 134* 141  K 3.7 3.2* 3.8 4.2  CL 101 99 98 102  CO2 28 27 29   --   GLUCOSE 107* 82 119* 84  BUN 14 9 9 11   CREATININE 0.78 0.63 0.59 0.50  CALCIUM 9.1 8.8* 8.9  --   MG  --  2.0  --   --     GFR: Estimated Creatinine Clearance: 63.2 mL/min (by C-G formula based on SCr of 0.5 mg/dL).  Liver Function Tests: Recent Labs  Lab 04/03/21 1820 04/04/21 0535 04/08/21 1206  AST 26 26 32  ALT 16 13 18   ALKPHOS 59 56 69  BILITOT 0.6 0.8 0.6  PROT 7.0 7.1 8.3*  ALBUMIN 3.7 3.6 4.4     Coagulation Profile: Recent Labs  Lab 04/04/21 0535 04/08/21 1206  INR 1.1 1.0     CBG: Recent Labs  Lab 04/04/21 0728  GLUCAP 98     Recent Results (from the past 240 hour(s))  Resp Panel by RT-PCR (Flu A&B, Covid) Nasopharyngeal Swab     Status: None   Collection Time: 04/03/21  9:28 PM    Specimen: Nasopharyngeal Swab; Nasopharyngeal(NP) swabs in vial transport medium  Result Value Ref Range Status   SARS Coronavirus 2 by RT PCR NEGATIVE NEGATIVE Final    Comment: (NOTE) SARS-CoV-2 target nucleic acids are NOT DETECTED.  The SARS-CoV-2 RNA is generally detectable in upper respiratory specimens during the acute phase of infection. The lowest concentration of SARS-CoV-2 viral copies this assay can detect is 138 copies/mL. A negative result does not preclude SARS-Cov-2 infection and should not be used as the sole basis for treatment or other patient management decisions. A negative result may occur with  improper specimen collection/handling, submission of specimen other than nasopharyngeal swab, presence of viral mutation(s) within the areas targeted by this assay, and inadequate number of viral copies(<138 copies/mL). A negative result must be combined with  clinical observations, patient history, and epidemiological information. The expected result is Negative.  Fact Sheet for Patients:  EntrepreneurPulse.com.au  Fact Sheet for Healthcare Providers:  IncredibleEmployment.be  This test is no t yet approved or cleared by the [place] FDA and  has been authorized for detection and/or diagnosis of SARS-CoV-2 by FDA under an Emergency Use Authorization (EUA). This EUA will remain  in effect (meaning this test can be used) for the duration of the COVID-19 declaration under Section 564(b)(1) of the Act, 21 U.S.C.section 360bbb-3(b)(1), unless the authorization is terminated  or revoked sooner.       Influenza A by PCR NEGATIVE NEGATIVE Final   Influenza B by PCR NEGATIVE NEGATIVE Final    Comment: (NOTE) The Xpert Xpress SARS-CoV-2/FLU/RSV plus assay is intended as an aid in the diagnosis of influenza from Nasopharyngeal swab specimens and should not be used as a sole basis for treatment. Nasal washings and aspirates are  unacceptable for Xpert Xpress SARS-CoV-2/FLU/RSV testing.  Fact Sheet for Patients: EntrepreneurPulse.com.au  Fact Sheet for Healthcare Providers: IncredibleEmployment.be  This test is not yet approved or cleared by the [place] FDA and has been authorized for detection and/or diagnosis of SARS-CoV-2 by FDA under an Emergency Use Authorization (EUA). This EUA will remain in effect (meaning this test can be used) for the duration of the COVID-19 declaration under Section 564(b)(1) of the Act, 21 U.S.C. section 360bbb-3(b)(1), unless the authorization is terminated or revoked.  Performed at [facility], [place] [address] [place], [place] [address]   Resp Panel by RT-PCR (Flu A&B, Covid) Nasopharyngeal Swab     Status: None   Collection Time: 04/08/21  1:54 PM   Specimen: Nasopharyngeal Swab; Nasopharyngeal(NP) swabs in vial transport medium  Result Value Ref Range Status   SARS Coronavirus 2 by RT PCR NEGATIVE NEGATIVE Final    Comment: (NOTE) SARS-CoV-2 target nucleic acids are NOT DETECTED.  The SARS-CoV-2 RNA is generally detectable in upper respiratory specimens during the acute phase of infection. The lowest concentration of SARS-CoV-2 viral copies this assay can detect is 138 copies/mL. A negative result does not preclude SARS-Cov-2 infection and should not be used as the sole basis for treatment or other patient management decisions. A negative result may occur with  improper specimen collection/handling, submission of specimen other than nasopharyngeal swab, presence of viral mutation(s) within the areas targeted by this assay, and inadequate number of viral copies(<138 copies/mL). A negative result must be combined with clinical observations, patient history, and epidemiological information. The expected result is Negative.  Fact Sheet for Patients:  EntrepreneurPulse.com.au  Fact Sheet for Healthcare  Providers:  IncredibleEmployment.be  This test is no t yet approved or cleared by the [place] FDA and  has been authorized for detection and/or diagnosis of SARS-CoV-2 by FDA under an Emergency Use Authorization (EUA). This EUA will remain  in effect (meaning this test can be used) for the duration of the COVID-19 declaration under Section 564(b)(1) of the Act, 21 U.S.C.section 360bbb-3(b)(1), unless the authorization is terminated  or revoked sooner.       Influenza A by PCR NEGATIVE NEGATIVE Final   Influenza B by PCR NEGATIVE NEGATIVE Final    Comment: (NOTE) The Xpert Xpress SARS-CoV-2/FLU/RSV plus assay is intended as an aid in the diagnosis of influenza from Nasopharyngeal swab specimens and should not be used as a sole basis for treatment. Nasal washings and aspirates are unacceptable for Xpert Xpress SARS-CoV-2/FLU/RSV testing.  Fact Sheet for Patients:  EntrepreneurPulse.com.au  Fact Sheet for Healthcare Providers: IncredibleEmployment.be  This test is not yet approved or cleared by the [place] FDA and has been authorized for detection and/or diagnosis of SARS-CoV-2 by FDA under an Emergency Use Authorization (EUA). This EUA will remain in effect (meaning this test can be used) for the duration of the COVID-19 declaration under Section 564(b)(1) of the Act, 21 U.S.C. section 360bbb-3(b)(1), unless the authorization is terminated or revoked.  Performed at [facility], [place] [address] [place], [place] [address]       Radiology Studies: MR BRAIN WO CONTRAST  Result Date: 04/08/2021 CLINICAL DATA:  Stroke follow-up. EXAM: MRI HEAD WITHOUT CONTRAST TECHNIQUE: Multiplanar, multiecho pulse sequences of the brain and surrounding structures were obtained without intravenous contrast. COMPARISON:  CT angio head and neck 04/08/2021.  MRI head 04/04/2021 FINDINGS: Brain: Limited study. Diffusion-weighted  imaging only was performed. No areas of restricted diffusion. No acute infarct. There is susceptibility in the central pons most compatible with cavernoma. This is unchanged from the prior MRI. Review of the recent MRI reveals increased FLAIR signal in the left anterior frontal lobe with associated serpiginous enhancement. Follow-up recommended. This area does not show restricted diffusion. Possible seizure related finding. IMPRESSION: Negative for acute infarct. Limited to diffusion weighted imaging only. Electronically Signed   By: Franchot Gallo M.D.   On: 04/08/2021 13:37   CT CEREBRAL PERFUSION W CONTRAST  Result Date: 04/08/2021 CLINICAL DATA:  Code stroke.  Bilateral cerebellar infarct. EXAM: CT ANGIOGRAPHY HEAD AND NECK CT PERFUSION BRAIN TECHNIQUE: Multidetector CT imaging of the head and neck was performed using the standard protocol during bolus administration of intravenous contrast. Multiplanar CT image reconstructions and MIPs were obtained to evaluate the vascular anatomy. Carotid stenosis measurements (when applicable) are obtained utilizing NASCET criteria, using the distal internal carotid diameter as the denominator. Multiphase CT imaging of the brain was performed following IV bolus contrast injection. Subsequent parametric perfusion maps were calculated using RAPID software. CONTRAST:  48m OMNIPAQUE IOHEXOL 350 MG/ML SOLN; 58mOMNIPAQUE IOHEXOL 350 MG/ML SOLN COMPARISON:  MRI head and CT head 04/08/2021 FINDINGS: CTA NECK FINDINGS Aortic arch: Standard branching. Imaged portion shows no evidence of aneurysm or dissection. No significant stenosis of the major arch vessel origins. Right carotid system: Normal right carotid. Negative for atherosclerotic disease or dissection. Left carotid system: Normal left carotid. Negative for atherosclerotic disease or dissection. Tortuosity of the left internal carotid artery. Vertebral arteries: Normal vertebral arteries bilaterally. Skeleton: Cervical  kyphosis which may be positional. No acute skeletal abnormality. Other neck: Negative Upper chest: Mild patchy airspace disease right upper lobe posteriorly. This may be scarring and is unchanged from chest CT of 11/10/2019 Review of the MIP images confirms the above findings CTA HEAD FINDINGS Anterior circulation: Cavernous carotid patent bilaterally. Anterior and middle cerebral arteries patent bilaterally without stenosis or large vessel occlusion. Review of the prior MRI with contrast 04/04/2021. This demonstrated serpiginous enhancement in the left anterior frontal lobe. No vascular malformation is seen in this area on CTA. This may have been due to cerebritis or luxury perfusion from recent infarction on the prior MRI. Recommend attention on MRI scheduled for later today. Posterior circulation: Both vertebral arteries patent to the basilar. Basilar widely patent. Posterior cerebral arteries widely patent bilaterally. Venous sinuses: Normal venous enhancement. Anatomic variants: None Review of the MIP images confirms the above findings CT Brain Perfusion Findings: ASPECTS: 10 CBF (<30%) Volume: 86m61merfusion (Tmax>6.0s) volume: 86mL12msmatch Volume: 86mL 11marction  Location:None IMPRESSION: 1. CT perfusion negative for acute infarction. 2. Negative for intracranial large vessel occlusion 3. Normal carotid and vertebral arteries bilaterally. No significant atherosclerotic disease. 4. 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 Serpiginous contrast enhancement was seen on the recent MRI. This enhancement may have been due to luxury perfusion from recent infarct or cerebritis. Attention on follow-up MRI scheduled for today. 5. These results were called by telephone at the time of interpretation on 04/08/2021 at 1:02 pm to provider XU , who verbally acknowledged these results. Electronically Signed   By: Franchot Gallo M.D.   On: 04/08/2021 13:02   EEG adult  Result Date: 04/08/2021 Derek Jack, MD     04/08/2021  7:45 PM Routine EEG Report Martha Schneider is a 55 y.o. female with a history of seizures who is undergoing an EEG to evaluate for seizures. Report: This EEG was acquired with electrodes placed according to the International 10-20 electrode system (including Fp1, Fp2, F3, F4, C3, C4, P3, P4, O1, O2, T3, T4, T5, T6, A1, A2, Fz, Cz, Pz). The following electrodes were missing or displaced: none. The occipital dominant rhythm was 6-7 Hz with intermittent more pronounced diffuse slowing. This activity is reactive to stimulation. Drowsiness was manifested by background fragmentation; deeper stages of sleep were not identified. There was no focal slowing. There are intermittent, at times frequent, sharp waves with increased rhythmicity of background activity over the left frontal region. There is EMG artifact throughout the recording which somewhat limits interpretation. With this caveat, there were no clear electrographic seizures identified. Photic stimulation and hyperventilation were not performed. Impression and clinical correlation: This EEG was obtained while awake and drowsy and is abnormal due to: - Mild-to-moderate diffuse slowing, indicative of global cerebral dysfunction - Intermittent, at times frequent, sharp waves with increased rhythmicity of background activity over the left frontal region, suggestive of increased epileptogenic potential in this region - Recording was degraded by EMG artifact, with that caveat no definitive electrographic seizures were identified Su Monks, MD [facility] [phone] If 7pm- 7am, please page neurology on call as listed in [place].   CT HEAD CODE STROKE WO CONTRAST  Result Date: 04/08/2021 CLINICAL DATA:  Code stroke. Acute neuro deficit. Slurred speech aphasia. EXAM: CT HEAD WITHOUT CONTRAST TECHNIQUE: Contiguous axial images were obtained from the base of the skull through the vertex without intravenous contrast. COMPARISON:   CT head 04/03/2021 FINDINGS: Brain: No evidence of acute infarction, hemorrhage, hydrocephalus, extra-axial collection or mass lesion/mass effect. Vascular: Negative for hyperdense vessel Skull: Negative Sinuses/Orbits: Negative Other: None ASPECTS ([place] Stroke Program Early CT Score) - Ganglionic level infarction (caudate, lentiform nuclei, internal capsule, insula, M1-M3 cortex): 7 - Supraganglionic infarction (M4-M6 cortex): 3 Total score (0-10 with 10 being normal): 10 IMPRESSION: 1. Negative CT head 2. ASPECTS is 10 3. Code stroke imaging results were communicated on 04/08/2021 at 12:26 pm to provider Erlinda Hong via text page Electronically Signed   By: Franchot Gallo M.D.   On: 04/08/2021 12:26   CT ANGIO NECK CODE STROKE  Result Date: 04/08/2021 CLINICAL DATA:  Code stroke.  Bilateral cerebellar infarct. EXAM: CT ANGIOGRAPHY HEAD AND NECK CT PERFUSION BRAIN TECHNIQUE: Multidetector CT imaging of the head and neck was performed using the standard protocol during bolus administration of intravenous contrast. Multiplanar CT image reconstructions and MIPs were obtained to evaluate the vascular anatomy. Carotid stenosis measurements (when applicable) are obtained utilizing NASCET criteria, using the distal internal carotid diameter as the denominator.  Multiphase CT imaging of the brain was performed following IV bolus contrast injection. Subsequent parametric perfusion maps were calculated using RAPID software. CONTRAST:  5m OMNIPAQUE IOHEXOL 350 MG/ML SOLN; 575mOMNIPAQUE IOHEXOL 350 MG/ML SOLN COMPARISON:  MRI head and CT head 04/08/2021 FINDINGS: CTA NECK FINDINGS Aortic arch: Standard branching. Imaged portion shows no evidence of aneurysm or dissection. No significant stenosis of the major arch vessel origins. Right carotid system: Normal right carotid. Negative for atherosclerotic disease or dissection. Left carotid system: Normal left carotid. Negative for atherosclerotic disease or dissection. Tortuosity  of the left internal carotid artery. Vertebral arteries: Normal vertebral arteries bilaterally. Skeleton: Cervical kyphosis which may be positional. No acute skeletal abnormality. Other neck: Negative Upper chest: Mild patchy airspace disease right upper lobe posteriorly. This may be scarring and is unchanged from chest CT of 11/10/2019 Review of the MIP images confirms the above findings CTA HEAD FINDINGS Anterior circulation: Cavernous carotid patent bilaterally. Anterior and middle cerebral arteries patent bilaterally without stenosis or large vessel occlusion. Review of the prior MRI with contrast 04/04/2021. This demonstrated serpiginous enhancement in the left anterior frontal lobe. No vascular malformation is seen in this area on CTA. This may have been due to cerebritis or luxury perfusion from recent infarction on the prior MRI. Recommend attention on MRI scheduled for later today. Posterior circulation: Both vertebral arteries patent to the basilar. Basilar widely patent. Posterior cerebral arteries widely patent bilaterally. Venous sinuses: Normal venous enhancement. Anatomic variants: None Review of the MIP images confirms the above findings CT Brain Perfusion Findings: ASPECTS: 10 CBF (<30%) Volume: 72m76merfusion (Tmax>6.0s) volume: 72mL57msmatch Volume: 72mL 84marction Location:None IMPRESSION: 1. CT perfusion negative for acute infarction. 2. Negative for intracranial large vessel occlusion 3. Normal carotid and vertebral arteries bilaterally. No significant atherosclerotic disease. 4. 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 Serpiginous contrast enhancement was seen on the recent MRI. This enhancement may have been due to luxury perfusion from recent infarct or cerebritis. Attention on follow-up MRI scheduled for today. 5. These results were called by telephone at the time of interpretation on 04/08/2021 at 1:02 pm to provider XU , who verbally acknowledged these results.  Electronically Signed   By: CharlFranchot Gallo   On: 04/08/2021 13:02       LOS: 1 day   [facility] Pager on [email or web address]  04/09/2021, 9:00 AM

## 2021-04-09 NOTE — ED Notes (Signed)
Neurology in to see, at [facility]. Remains confused. EEG remains in progress.

## 2021-04-10 ENCOUNTER — Inpatient Hospital Stay (HOSPITAL_COMMUNITY): Payer: BC Managed Care – PPO

## 2021-04-10 LAB — BASIC METABOLIC PANEL
Anion gap: 11 (ref 5–15)
BUN: 10 mg/dL (ref 6–20)
CO2: 25 mmol/L (ref 22–32)
Calcium: 9.2 mg/dL (ref 8.9–10.3)
Chloride: 100 mmol/L (ref 98–111)
Creatinine, Ser: 0.68 mg/dL (ref 0.44–1.00)
GFR, Estimated: >60 mL/min (ref >60)
Glucose, Bld: 84 mg/dL (ref 70–99)
Potassium: 3.6 mmol/L (ref 3.5–5.1)
Sodium: 136 mmol/L (ref 135–145)

## 2021-04-10 IMAGING — MR MR HEAD W/O CM
8 of 10 series · 40 of 48 positions shown · non-contrast
Comparison: [DATE].  [DATE].

CLINICAL DATA: Seizure disorder.  Confusion.  Clinical change.

EXAM:
MRI HEAD WITHOUT CONTRAST
TECHNIQUE: Multiplanar, multiecho pulse sequences of the brain and surrounding
structures were obtained without intravenous contrast.

[Series 3: DWI · axial · 3.0mm · 1.09mm/px · z∈[-63,+72]mm · 11 of 96 slices shown (1 of 4)]
[im 1/96]
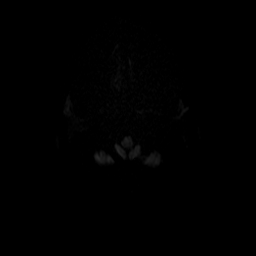
[im 10/96]
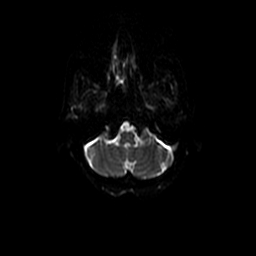
[im 20/96]
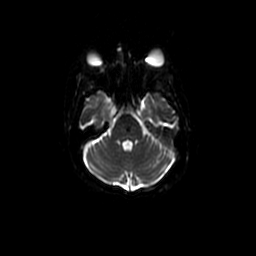
[im 29/96]
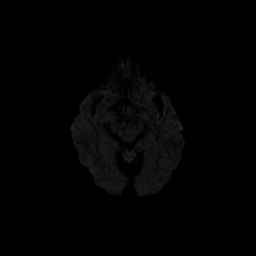
[im 39/96]
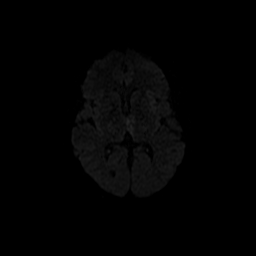
[im 48/96]
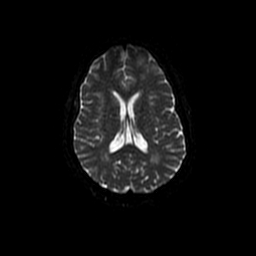
[im 58/96]
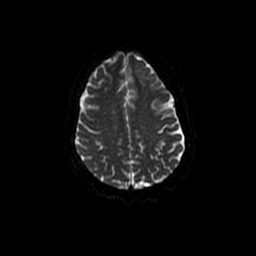
[im 67/96]
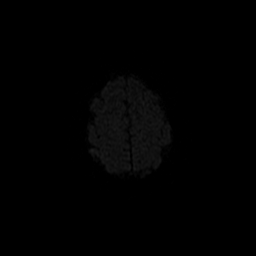
[im 77/96]
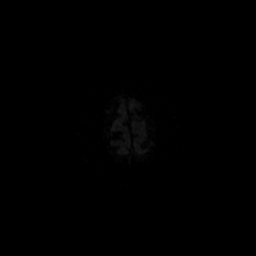
[im 86/96]
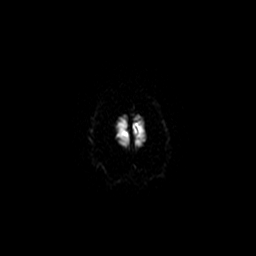
[im 96/96]
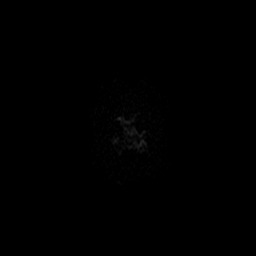

[Series 4: DWI · coronal · 5.0mm · 1.09mm/px · 9 of 70 slices shown (2 of 4)]
[im 1/70]
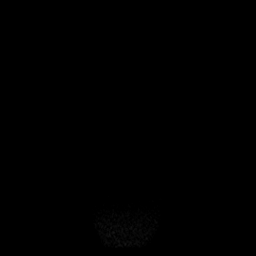
[im 9/70]
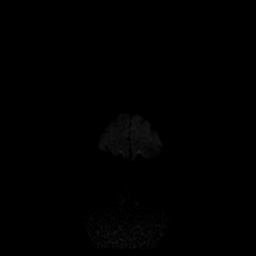
[im 18/70]
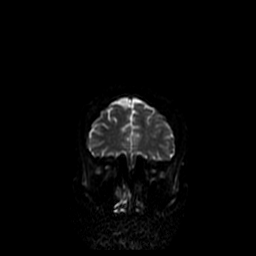
[im 26/70]
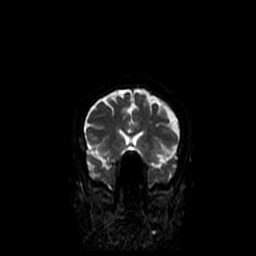
[im 35/70]
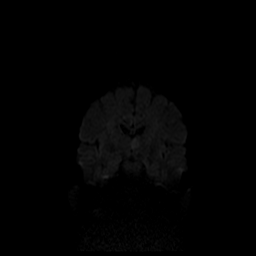
[im 44/70]
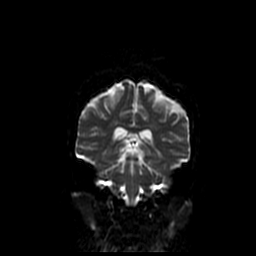
[im 52/70]
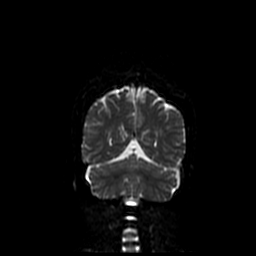
[im 61/70]
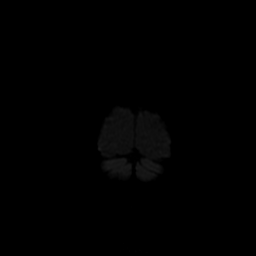
[im 70/70]
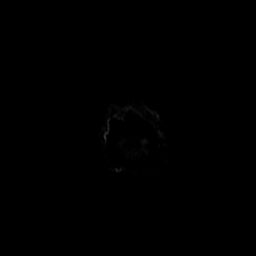

[Series 6: T2 · axial · 5.0mm · 0.43mm/px · z∈[-92,+31]mm · 3 of 24 slices shown (1 of 2)]
[im 1/24]
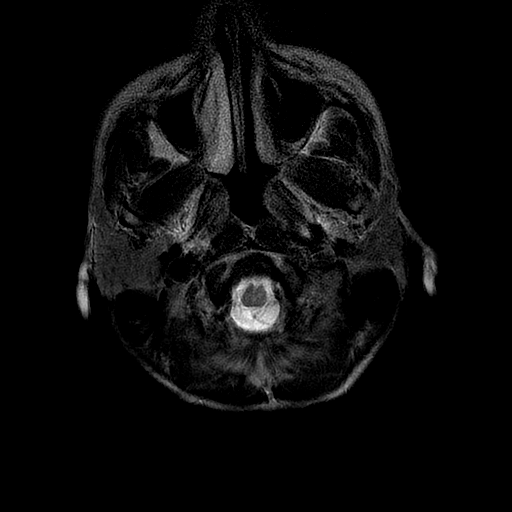
[im 12/24]
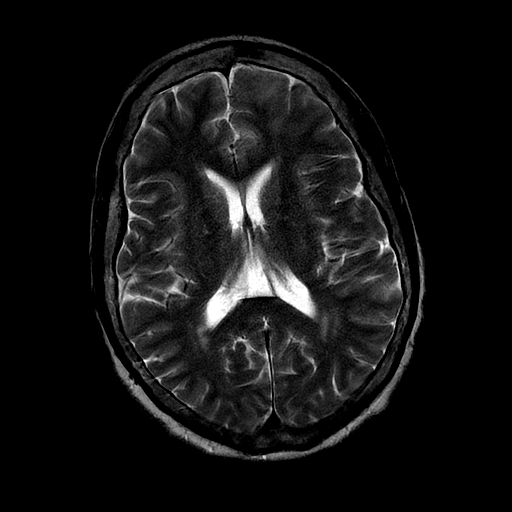
[im 24/24]
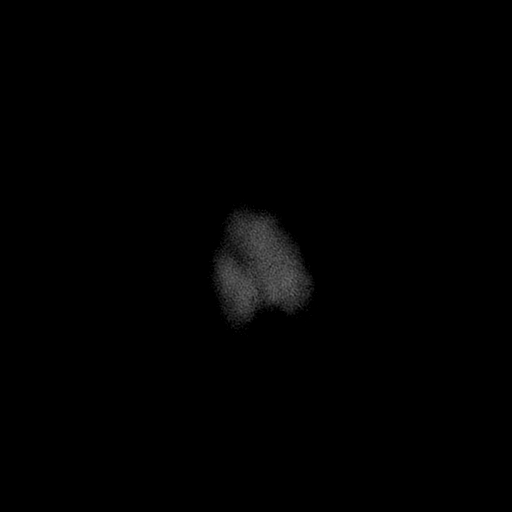

[Series 7: FLAIR · axial · 3.0mm · 0.43mm/px · z∈[-92,+26]mm · 3 of 24 slices shown (1 of 2)]
[im 1/24]
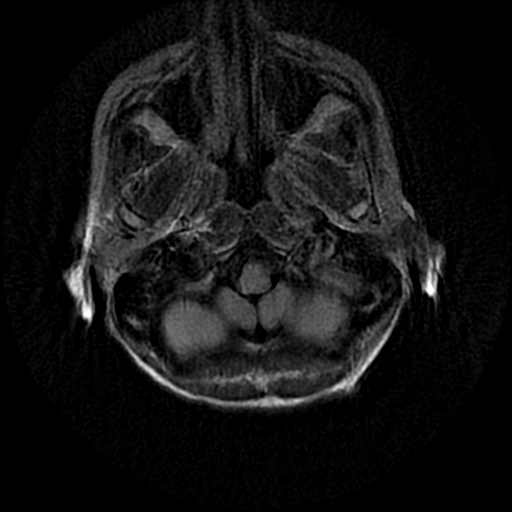
[im 12/24]
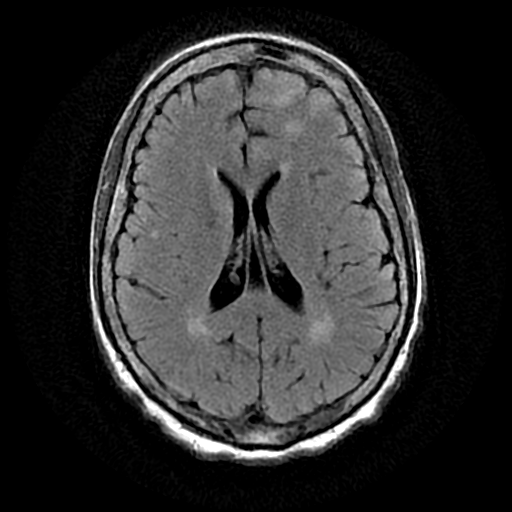
[im 24/24]
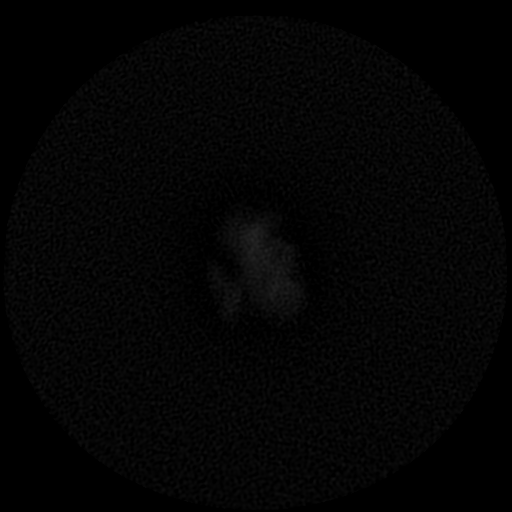

[Series 11: FLAIR · axial · 5.0mm · 0.43mm/px · z∈[-66,+63]mm · 3 of 25 slices shown (2 of 2)]
[im 1/25]
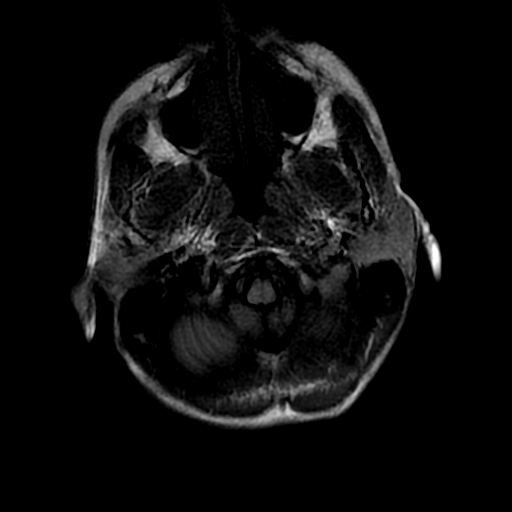
[im 13/25]
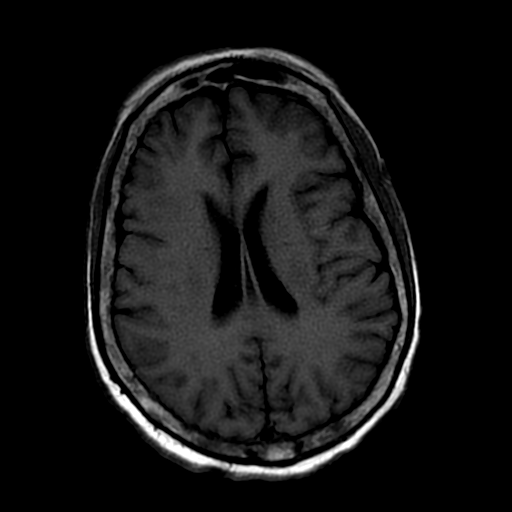
[im 25/25]
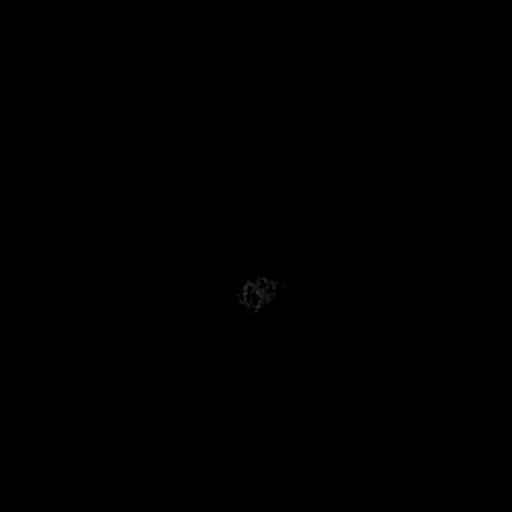

[Series 12: T2 · oblique · 3.0mm · 0.35mm/px · 1 of 24 slices shown (2 of 2)]
[im 1/24]
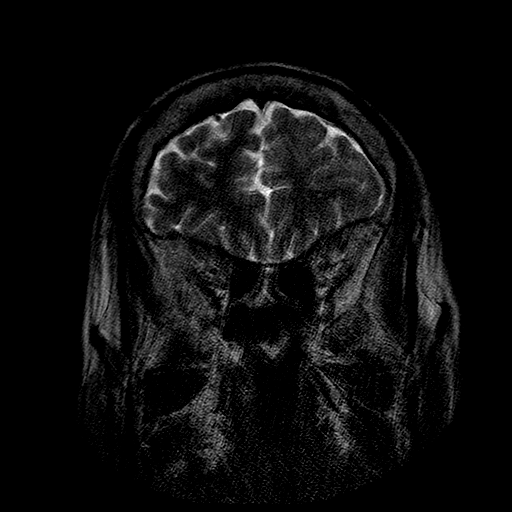

[Series 300: DWI · axial · 3.0mm · 1.09mm/px · z∈[-63,+72]mm · 6 of 48 slices shown (3 of 4)]
[im 1/48]
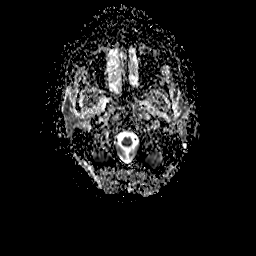
[im 10/48]
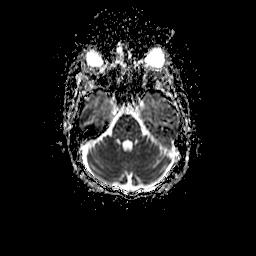
[im 19/48]
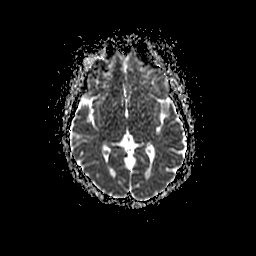
[im 29/48]
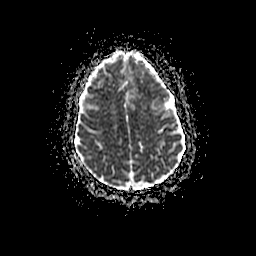
[im 38/48]
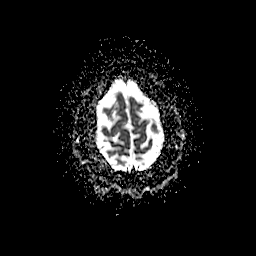
[im 48/48]
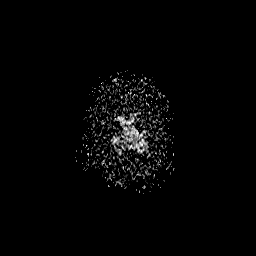

[Series 400: DWI · coronal · 5.0mm · 1.09mm/px · 4 of 35 slices shown (4 of 4)]
[im 1/35]
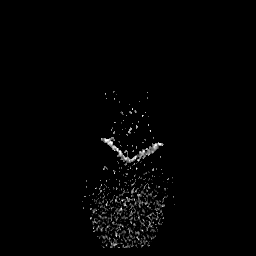
[im 12/35]
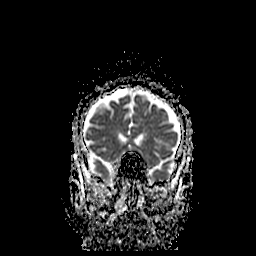
[im 23/35]
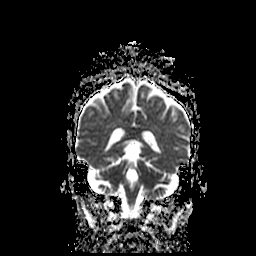
[im 35/35]
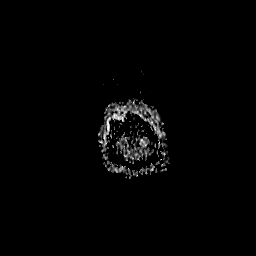

[40 of 48 positions shown; findings below may reference images not displayed]

FINDINGS: Brain: The study suffers from some motion degradation. No focal
abnormality affects the cerebellum. There is chronic susceptibility
artifact in the central pons which could be due to an old
hemorrhagic lacunar infarction or a small cavernoma. Cerebral
hemispheres show mild chronic small-vessel ischemic change on the
right. There is newly seen low level restricted diffusion, T2 and
FLAIR signal abnormality in the medial left thalamus, left cingulate
gyrus, left frontal lobe and left insula. These findings are most
likely postictal. Ischemic infarctions would be less likely. No
evidence of mass lesion. No acute hemorrhage. No hydrocephalus or
extra-axial collection.

Vascular: Major vessels at the base of the brain show flow.

Skull and upper cervical spine: Negative

Sinuses/Orbits: Clear/normal

Other: None
IMPRESSION: Low level restricted diffusion, increased T2 signal and increased
FLAIR signal within the left medial thalamus, left cingulate gyrus,
anterior left frontal lobe, and left insula. These findings are felt
most likely to be postictal phenomenon rather than ischemic
infarctions.

## 2021-04-10 MED ORDER — POTASSIUM CHLORIDE 20 MEQ PO PACK
40.0000 meq | PACK | Freq: Once | ORAL | Status: AC
  Administered 2021-04-10: 40 meq via ORAL
  Filled 2021-04-10: qty 2

## 2021-04-10 NOTE — Progress Notes (Signed)
Neurology Progress Note   S:// EEG continues to show PLEDs.  No seizures. Subjectively better-talking full sentences.   O:// Current vital signs: BP (!) 130/91 (BP Location: Left Arm)    Pulse 87    Temp 99 F (37.2 C) (Oral)    Resp 14    Ht 5' 4"  (1.626 m)    Wt 51 kg    SpO2 99%    BMI 19.31 kg/m  Vital signs in last 24 hours: Temp:  [97.6 F (36.4 C)-99 F (37.2 C)] 99 F (37.2 C) (12/13 0803) Pulse Rate:  [79-115] 87 (12/13 0803) Resp:  [14-19] 14 (12/13 0803) BP: (114-143)/(76-92) 130/91 (12/13 0803) SpO2:  [96 %-100 %] 99 % (12/13 0803)  General: Awake alert in no distress HEENT: Normocephalic/atraumatic CVs: Regular rhythm Abdomen nondistended nontender Respiratory: Breathing well saturating normally on room air Extremities warm well perfused Neurological exam Awake alert oriented to self, month, year-that she is in a hospital, [facility] hospital when [facility] is at [facility]. Naming intact with some mild perseveration. Repetition intact Fluency intact Cranial nerves II to XII intact Motor examination with no focal motor deficits Sensation intact No dysmetria  Medications  Current Facility-Administered Medications:    0.9 %  sodium chloride infusion, 75 mL/hr, Intravenous, Continuous, Bonnielee Haff, MD, Last Rate: 75 mL/hr at 04/09/21 2140, 75 mL/hr at 04/09/21 2140   acetaminophen (TYLENOL) tablet 650 mg, 650 mg, Oral, Q6H PRN **OR** acetaminophen (TYLENOL) suppository 650 mg, 650 mg, Rectal, Q6H PRN, Tamala Julian, Rondell A, MD   albuterol (PROVENTIL) (2.5 MG/3ML) 0.083% nebulizer solution 2.5 mg, 2.5 mg, Nebulization, Q6H PRN, Tamala Julian, Rondell A, MD   chlorhexidine gluconate (MEDLINE KIT) (PERIDEX) 0.12 % solution 15 mL, 15 mL, Mouth Rinse, BID, Smith, Rondell A, MD, 15 mL at 04/10/21 0933   enoxaparin (LOVENOX) injection 40 mg, 40 mg, Subcutaneous, Q24H, Smith, Rondell A, MD, 40 mg at 34/74/25 9563   folic acid (FOLVITE) tablet 1 mg, 1 mg, Oral, Daily, Tamala Julian,  Rondell A, MD, 1 mg at 04/10/21 8756   hydrALAZINE (APRESOLINE) injection 10 mg, 10 mg, Intravenous, Q4H PRN, Tamala Julian, Rondell A, MD   levETIRAcetam (KEPPRA) IVPB 1500 mg/ 100 mL premix, 1,500 mg, Intravenous, Q12H, Amie Portland, MD, Last Rate: 400 mL/hr at 04/10/21 0937, 1,500 mg at 04/10/21 0937   LORazepam (ATIVAN) injection 1-2 mg, 1-2 mg, Intravenous, Q2H PRN, Tamala Julian, Rondell A, MD, 2 mg at 04/09/21 0921   LORazepam (ATIVAN) tablet 1-4 mg, 1-4 mg, Oral, Q1H PRN **OR** LORazepam (ATIVAN) injection 1-4 mg, 1-4 mg, Intravenous, Q1H PRN, Tamala Julian, Rondell A, MD, 1 mg at 04/10/21 1148   MEDLINE mouth rinse, 15 mL, Mouth Rinse, 10 times per day, Fuller Plan A, MD, 15 mL at 04/10/21 1331   multivitamin with minerals tablet 1 tablet, 1 tablet, Oral, Daily, Tamala Julian, Rondell A, MD, 1 tablet at 04/10/21 0933   nicotine (NICODERM CQ - dosed in mg/24 hours) patch 14 mg, 14 mg, Transdermal, Daily, Bonnielee Haff, MD, 14 mg at 04/10/21 0933   ondansetron (ZOFRAN) injection 4 mg, 4 mg, Intravenous, Q6H PRN, Bonnielee Haff, MD, 4 mg at 04/09/21 1317   pantoprazole (PROTONIX) injection 40 mg, 40 mg, Intravenous, Q24H, Smith, Rondell A, MD, 40 mg at 04/09/21 1815   sodium chloride flush (NS) 0.9 % injection 3 mL, 3 mL, Intravenous, Q12H, Smith, Rondell A, MD, 3 mL at 04/09/21 2137   thiamine tablet 100 mg, 100 mg, Oral, Daily, 100 mg at 04/10/21 0933 **OR** thiamine (B-1) injection  100 mg, 100 mg, Intravenous, Daily, Fuller Plan A, MD, 100 mg at 04/09/21 1020 Labs CBC    Component Value Date/Time   WBC 3.0 (L) 04/09/2021 0529   RBC 4.59 04/09/2021 0529   HGB 13.3 04/09/2021 0529   HGB 13.6 05/10/2020 1037   HCT 40.3 04/09/2021 0529   PLT 230 04/09/2021 0529   PLT 234 05/10/2020 1037   MCV 87.8 04/09/2021 0529   MCH 29.0 04/09/2021 0529   MCHC 33.0 04/09/2021 0529   RDW 13.5 04/09/2021 0529   LYMPHSABS 0.6 (L) 04/08/2021 1206   MONOABS 0.5 04/08/2021 1206   EOSABS 0.0 04/08/2021 1206   BASOSABS 0.0  04/08/2021 1206    CMP     Component Value Date/Time   NA 136 04/10/2021 0230   K 3.6 04/10/2021 0230   CL 100 04/10/2021 0230   CO2 25 04/10/2021 0230   GLUCOSE 84 04/10/2021 0230   BUN 10 04/10/2021 0230   CREATININE 0.68 04/10/2021 0230   CREATININE 0.78 05/10/2020 1037   CREATININE 0.76 12/17/2013 1653   CALCIUM 9.2 04/10/2021 0230   PROT 8.3 (H) 04/08/2021 1206   ALBUMIN 4.4 04/08/2021 1206   AST 32 04/08/2021 1206   AST 26 05/10/2020 1037   ALT 18 04/08/2021 1206   ALT 14 05/10/2020 1037   ALKPHOS 69 04/08/2021 1206   BILITOT 0.6 04/08/2021 1206   BILITOT 0.6 05/10/2020 1037   GFRNONAA >60 04/10/2021 0230   GFRNONAA >60 05/10/2020 1037   GFRNONAA >89 12/17/2013 1653   GFRAA >60 10/20/2019 1028   GFRAA >89 12/17/2013 1653    Imaging I have reviewed images in epic and the results pertinent to this consultation are: MR brain DWI negative.  Other sequences not performed. Prior MRI few days ago with T2 hyperintensity and cluster of serpiginous contrast enhancement in the left frontal pole, with CT angiography not very consistent with an AVM. Recommend repeating MRI brain with without contrast. Today she was taken for MRI-did not stay still for the whole study Noncontrasted MRI study was completed Low-level restricted diffusion, increased T2 signal and increased flair signal within the left medial thalamus, left cingulate gyrus and anterior left frontal lobe along with left insula-most likely postictal rather than ischemic infarctions.  Assessment: 55 year old past history of hypertension, smoker, recent seizure where she came in with speech arrest in setting of spell and altered mental status.  Recently admitted to 10/16/2020 with a witnessed generalized tonic-clonic seizure-seizure work-up including CT, EEG was unremarkable. MRI with and without contrast at that time showed a small area of T2 hyperintensity in cluster of serpiginous contrast-enhancement at the left frontal  pole with?  AVM. She was discharged with Keppra 500 twice daily and was compliant with her medication per her husband. Day prior to presentation this time, around 10 AM she had speech arrest, staring off, becoming nonverbal and not following commands.  CT, CTA head and neck with no evidence of AVM.  CT perfusion negative as well.  MRI-stroke protocol with DWI only was done which was negative for an acute stroke. MRI with and without contrast was attempted but she was unable to lay still-there is evidence of low-level restricted diffusion and increased T2 signal antlers flair signal in the left medial thalamus, left cingulate gyrus and anterior left frontal lobe along with left insula-most likely postictal changes. She was given additional Keppra and maintenance dose of Keppra was increased to 1 g twice daily. Currently on LTM.  No seizures overnight-does show PLEDs. Today's  examination is much better than yesterday.  She is able to give me much more detailed history, follow commands, and make full sentences although she is still a little slow to respond to questions.  There is also mild perseveration..  Recommendations: Continue Keppra 1500 twice daily LTM EEG for 1 more day-if the exam remains the same or improves and there are no seizures on LTM, can be discontinued tomorrow. MRI findings likely secondary to the postictal changes.  Will take a few days to weeks to normalize. No suspicion for underlying infection to prompt LP. Maintain seizure precautions We will follow tomorrow. Plan discussed with Dr. [place] Pink over secure chat. -- Amie Portland, MD Neurologist [facility] Pager: [phone]

## 2021-04-10 NOTE — Progress Notes (Signed)
Pt transported via bed to MRI for scan. OK per Dr. Rito Ehrlich and Wilford Corner to go off EEG and tele for scan.

## 2021-04-10 NOTE — Plan of Care (Signed)
  Problem: Safety: Goal: Non-violent Restraint(s) Outcome: Progressing   Problem: Education: Goal: Expressions of having a comfortable level of knowledge regarding the disease process will increase Outcome: Progressing   Problem: Coping: Goal: Ability to adjust to condition or change in health will improve Outcome: Progressing   Problem: Medication: Goal: Risk for medication side effects will decrease Outcome: Progressing   Problem: Clinical Measurements: Goal: Complications related to the disease process, condition or treatment will be avoided or minimized Outcome: Progressing   Problem: Safety: Goal: Verbalization of understanding the information provided will improve Outcome: Progressing

## 2021-04-10 NOTE — Progress Notes (Signed)
[facility] PROGRESS NOTE   Martha Schneider BJS:[id number] DOB: 02-05-1966 DOA: 04/08/2021  PCP: Lujean Amel, MD  Brief History/Interval Summary: 55 y.o. female with medical history significant for hypertension, chronic leukopenia, sarcoidosis, tobacco abuse, and depression who presented after being acutely altered on the morning of admission.  Patient was recently hospitalized and was discharged on December 7 after being managed for new onset seizure disorder.  Patient's husband mentioned compliance with her Keppra.  Came in with altered mental status as mentioned earlier.  Initially there was concern for stroke but it was felt that patient was actually experiencing seizures.  Neurology was consulted.  Keppra dose was increased.  Placed on long-term EEG monitoring and hospitalized.    Reason for Visit: Seizure disorder  Consultants: Neurology  Procedures: Continuous EEG    Subjective/Interval History: Patient is awake.  Remains pleasantly confused.  No family at bedside this morning.       Assessment/Plan:  Seizure disorder with recurrent seizures Recently discharged on December 7 on Keppra.  Appears to be experiencing seizure activity.  Placed on long-term monitoring.  Neurology is following.  MRI without contrast did not show any acute stroke.  Influenza and COVID-19 PCR negative. Brain imaging studies have raised concern for abnormality in the left frontal area.  Initially thought to be vascular malformation, but not thought to be the same based on CT angiogram.  Neurology has recommended repeating MRI with and without contrast.  Patient should not be driving until seizure-free for 6 months and under physician's care. Patient remains on Keppra.  Dose was increased yesterday after her seizure activity yesterday.  Neurology continues to follow.  MRI brain with and without contrast is still pending. Speech therapy to see.  Hypertensive urgency Initial blood pressures were  elevated up to 186/110.  She is on amlodipine prior to admission.  Currently appears to be not on any blood pressure medications.  Elevated blood pressure readings could have been due to seizure and agitation.  Seems to have been stable in the last 24 hours.  Continue to monitor.  Transient hypoxia Secondary to seizure.  Stable now.  Saturating normal on room air.  Hypocalcemia Was given calcium gluconate for low ionized calcium.  C calcium level noted to be normal.  Alcohol abuse Apparently drinks 4-5 beers every day.  Could be contributing to her seizures.  Currently on CIWA protocol.  Thiamine multivitamins and folic acid.  Chronic leukopenia Stable.  History of depression On Zoloft  History of sarcoidosis Follow-up with pulmonology  Tobacco abuse Nicotine patch  DVT Prophylaxis: Lovenox Code Status: Full code Family Communication: No family at bedside today. Disposition Plan:   Hopefully return home when improved  Status is: Inpatient  Remains inpatient appropriate because: Seizures requiring continuous EEG monitoring    Medications: Scheduled:  chlorhexidine gluconate (MEDLINE KIT)  15 mL Mouth Rinse BID   enoxaparin (LOVENOX) injection  40 mg Subcutaneous Y07P   folic acid  1 mg Oral Daily   mouth rinse  15 mL Mouth Rinse 10 times per day   multivitamin with minerals  1 tablet Oral Daily   nicotine  14 mg Transdermal Daily   pantoprazole (PROTONIX) IV  40 mg Intravenous Q24H   sodium chloride flush  3 mL Intravenous Q12H   thiamine  100 mg Oral Daily   Or   thiamine  100 mg Intravenous Daily   Continuous:  sodium chloride 75 mL/hr (04/09/21 2140)   levETIRAcetam 1,500 mg (04/10/21 7106)   YIR:[id number] **  OR** acetaminophen, albuterol, hydrALAZINE, LORazepam, LORazepam **OR** LORazepam, ondansetron (ZOFRAN) IV  Antibiotics: Anti-infectives (From admission, onward)    None       Objective:  Vital Signs  Vitals:   04/09/21 1930 04/09/21 2353  04/10/21 0414 04/10/21 0803  BP: 115/85 120/90 (!) 128/92 (!) 130/91  Pulse: 80 80 79 87  Resp: 18 18 18 14   Temp: 98.4 F (36.9 C) 98.4 F (36.9 C) 98.5 F (36.9 C) 99 F (37.2 C)  TempSrc: Oral Oral Oral Oral  SpO2: 98% 99% 96% 99%  Weight:      Height:        Intake/Output Summary (Last 24 hours) at 04/10/2021 1031 Last data filed at 04/09/2021 1242 Gross per 24 hour  Intake 1000 ml  Output --  Net 1000 ml    Filed Weights   04/08/21 1200 04/08/21 1251  Weight: 51 kg 51 kg    General appearance: Awake alert.  In no distress.  Distracted Resp: Clear to auscultation bilaterally.  Normal effort Cardio: S1-S2 is normal regular.  No S3-S4.  No rubs murmurs or bruit GI: Abdomen is soft.  Nontender nondistended.  Bowel sounds are present normal.  No masses organomegaly Extremities: No edema.  Moving all of her extremities Neurologic: Disoriented.  No focal neurological deficits.     Lab Results:  Data Reviewed: I have personally reviewed following labs and imaging studies  CBC: Recent Labs  Lab 04/03/21 1820 04/04/21 0535 04/08/21 1206 04/08/21 1212 04/09/21 0529  WBC 2.7* 2.4* 3.4*  --  3.0*  NEUTROABS 1.6* 1.1* 2.3  --   --   HGB 11.7* 12.3 14.1 15.3* 13.3  HCT 36.6 36.5 43.1 45.0 40.3  MCV 89.9 87.5 89.0  --  87.8  PLT 210 210 240  --  230     Basic Metabolic Panel: Recent Labs  Lab 04/03/21 1820 04/04/21 0535 04/08/21 1206 04/08/21 1212 04/10/21 0230  NA 138 135 134* 141 136  K 3.7 3.2* 3.8 4.2 3.6  CL 101 99 98 102 100  CO2 28 27 29   --  25  GLUCOSE 107* 82 119* 84 84  BUN 14 9 9 11 10   CREATININE 0.78 0.63 0.59 0.50 0.68  CALCIUM 9.1 8.8* 8.9  --  9.2  MG  --  2.0  --   --   --      GFR: Estimated Creatinine Clearance: 63.2 mL/min (by C-G formula based on SCr of 0.68 mg/dL).  Liver Function Tests: Recent Labs  Lab 04/03/21 1820 04/04/21 0535 04/08/21 1206  AST 26 26 32  ALT 16 13 18   ALKPHOS 59 56 69  BILITOT 0.6 0.8 0.6   PROT 7.0 7.1 8.3*  ALBUMIN 3.7 3.6 4.4      Coagulation Profile: Recent Labs  Lab 04/04/21 0535 04/08/21 1206  INR 1.1 1.0      CBG: Recent Labs  Lab 04/04/21 0728  GLUCAP 98      Recent Results (from the past 240 hour(s))  Resp Panel by RT-PCR (Flu A&B, Covid) Nasopharyngeal Swab     Status: None   Collection Time: 04/03/21  9:28 PM   Specimen: Nasopharyngeal Swab; Nasopharyngeal(NP) swabs in vial transport medium  Result Value Ref Range Status   SARS Coronavirus 2 by RT PCR NEGATIVE NEGATIVE Final    Comment: (NOTE) SARS-CoV-2 target nucleic acids are NOT DETECTED.  The SARS-CoV-2 RNA is generally detectable in upper respiratory specimens during the acute phase of infection. The lowest concentration of  SARS-CoV-2 viral copies this assay can detect is 138 copies/mL. A negative result does not preclude SARS-Cov-2 infection and should not be used as the sole basis for treatment or other patient management decisions. A negative result may occur with  improper specimen collection/handling, submission of specimen other than nasopharyngeal swab, presence of viral mutation(s) within the areas targeted by this assay, and inadequate number of viral copies(<138 copies/mL). A negative result must be combined with clinical observations, patient history, and epidemiological information. The expected result is Negative.  Fact Sheet for Patients:  EntrepreneurPulse.com.au  Fact Sheet for Healthcare Providers:  IncredibleEmployment.be  This test is no t yet approved or cleared by the [place] FDA and  has been authorized for detection and/or diagnosis of SARS-CoV-2 by FDA under an Emergency Use Authorization (EUA). This EUA will remain  in effect (meaning this test can be used) for the duration of the COVID-19 declaration under Section 564(b)(1) of the Act, 21 U.S.C.section 360bbb-3(b)(1), unless the authorization is terminated  or  revoked sooner.       Influenza A by PCR NEGATIVE NEGATIVE Final   Influenza B by PCR NEGATIVE NEGATIVE Final    Comment: (NOTE) The Xpert Xpress SARS-CoV-2/FLU/RSV plus assay is intended as an aid in the diagnosis of influenza from Nasopharyngeal swab specimens and should not be used as a sole basis for treatment. Nasal washings and aspirates are unacceptable for Xpert Xpress SARS-CoV-2/FLU/RSV testing.  Fact Sheet for Patients: EntrepreneurPulse.com.au  Fact Sheet for Healthcare Providers: IncredibleEmployment.be  This test is not yet approved or cleared by the [place] FDA and has been authorized for detection and/or diagnosis of SARS-CoV-2 by FDA under an Emergency Use Authorization (EUA). This EUA will remain in effect (meaning this test can be used) for the duration of the COVID-19 declaration under Section 564(b)(1) of the Act, 21 U.S.C. section 360bbb-3(b)(1), unless the authorization is terminated or revoked.  Performed at [facility], [place] [address], [place], [place] [address]   Resp Panel by RT-PCR (Flu A&B, Covid) Nasopharyngeal Swab     Status: None   Collection Time: 04/08/21  1:54 PM   Specimen: Nasopharyngeal Swab; Nasopharyngeal(NP) swabs in vial transport medium  Result Value Ref Range Status   SARS Coronavirus 2 by RT PCR NEGATIVE NEGATIVE Final    Comment: (NOTE) SARS-CoV-2 target nucleic acids are NOT DETECTED.  The SARS-CoV-2 RNA is generally detectable in upper respiratory specimens during the acute phase of infection. The lowest concentration of SARS-CoV-2 viral copies this assay can detect is 138 copies/mL. A negative result does not preclude SARS-Cov-2 infection and should not be used as the sole basis for treatment or other patient management decisions. A negative result may occur with  improper specimen collection/handling, submission of specimen other than nasopharyngeal swab, presence of viral  mutation(s) within the areas targeted by this assay, and inadequate number of viral copies(<138 copies/mL). A negative result must be combined with clinical observations, patient history, and epidemiological information. The expected result is Negative.  Fact Sheet for Patients:  EntrepreneurPulse.com.au  Fact Sheet for Healthcare Providers:  IncredibleEmployment.be  This test is no t yet approved or cleared by the [place] FDA and  has been authorized for detection and/or diagnosis of SARS-CoV-2 by FDA under an Emergency Use Authorization (EUA). This EUA will remain  in effect (meaning this test can be used) for the duration of the COVID-19 declaration under Section 564(b)(1) of the Act, 21 U.S.C.section 360bbb-3(b)(1), unless the authorization is terminated  or  revoked sooner.       Influenza A by PCR NEGATIVE NEGATIVE Final   Influenza B by PCR NEGATIVE NEGATIVE Final    Comment: (NOTE) The Xpert Xpress SARS-CoV-2/FLU/RSV plus assay is intended as an aid in the diagnosis of influenza from Nasopharyngeal swab specimens and should not be used as a sole basis for treatment. Nasal washings and aspirates are unacceptable for Xpert Xpress SARS-CoV-2/FLU/RSV testing.  Fact Sheet for Patients: EntrepreneurPulse.com.au  Fact Sheet for Healthcare Providers: IncredibleEmployment.be  This test is not yet approved or cleared by the [place] FDA and has been authorized for detection and/or diagnosis of SARS-CoV-2 by FDA under an Emergency Use Authorization (EUA). This EUA will remain in effect (meaning this test can be used) for the duration of the COVID-19 declaration under Section 564(b)(1) of the Act, 21 U.S.C. section 360bbb-3(b)(1), unless the authorization is terminated or revoked.  Performed at [facility], [place] [address], [place], [place] [address]        Radiology Studies: MR  BRAIN WO CONTRAST  Result Date: 04/08/2021 CLINICAL DATA:  Stroke follow-up. EXAM: MRI HEAD WITHOUT CONTRAST TECHNIQUE: Multiplanar, multiecho pulse sequences of the brain and surrounding structures were obtained without intravenous contrast. COMPARISON:  CT angio head and neck 04/08/2021.  MRI head 04/04/2021 FINDINGS: Brain: Limited study. Diffusion-weighted imaging only was performed. No areas of restricted diffusion. No acute infarct. There is susceptibility in the central pons most compatible with cavernoma. This is unchanged from the prior MRI. Review of the recent MRI reveals increased FLAIR signal in the left anterior frontal lobe with associated serpiginous enhancement. Follow-up recommended. This area does not show restricted diffusion. Possible seizure related finding. IMPRESSION: Negative for acute infarct. Limited to diffusion weighted imaging only. Electronically Signed   By: Franchot Gallo M.D.   On: 04/08/2021 13:37   CT CEREBRAL PERFUSION W CONTRAST  Result Date: 04/08/2021 CLINICAL DATA:  Code stroke.  Bilateral cerebellar infarct. EXAM: CT ANGIOGRAPHY HEAD AND NECK CT PERFUSION BRAIN TECHNIQUE: Multidetector CT imaging of the head and neck was performed using the standard protocol during bolus administration of intravenous contrast. Multiplanar CT image reconstructions and MIPs were obtained to evaluate the vascular anatomy. Carotid stenosis measurements (when applicable) are obtained utilizing NASCET criteria, using the distal internal carotid diameter as the denominator. Multiphase CT imaging of the brain was performed following IV bolus contrast injection. Subsequent parametric perfusion maps were calculated using RAPID software. CONTRAST:  24m OMNIPAQUE IOHEXOL 350 MG/ML SOLN; 549mOMNIPAQUE IOHEXOL 350 MG/ML SOLN COMPARISON:  MRI head and CT head 04/08/2021 FINDINGS: CTA NECK FINDINGS Aortic arch: Standard branching. Imaged portion shows no evidence of aneurysm or dissection. No  significant stenosis of the major arch vessel origins. Right carotid system: Normal right carotid. Negative for atherosclerotic disease or dissection. Left carotid system: Normal left carotid. Negative for atherosclerotic disease or dissection. Tortuosity of the left internal carotid artery. Vertebral arteries: Normal vertebral arteries bilaterally. Skeleton: Cervical kyphosis which may be positional. No acute skeletal abnormality. Other neck: Negative Upper chest: Mild patchy airspace disease right upper lobe posteriorly. This may be scarring and is unchanged from chest CT of 11/10/2019 Review of the MIP images confirms the above findings CTA HEAD FINDINGS Anterior circulation: Cavernous carotid patent bilaterally. Anterior and middle cerebral arteries patent bilaterally without stenosis or large vessel occlusion. Review of the prior MRI with contrast 04/04/2021. This demonstrated serpiginous enhancement in the left anterior frontal lobe. No vascular malformation is seen in this area on CTA. This  may have been due to cerebritis or luxury perfusion from recent infarction on the prior MRI. Recommend attention on MRI scheduled for later today. Posterior circulation: Both vertebral arteries patent to the basilar. Basilar widely patent. Posterior cerebral arteries widely patent bilaterally. Venous sinuses: Normal venous enhancement. Anatomic variants: None Review of the MIP images confirms the above findings CT Brain Perfusion Findings: ASPECTS: 10 CBF (<30%) Volume: 45m Perfusion (Tmax>6.0s) volume: 054mMismatch Volume: 54m554mnfarction Location:None IMPRESSION: 1. CT perfusion negative for acute infarction. 2. Negative for intracranial large vessel occlusion 3. Normal carotid and vertebral arteries bilaterally. No significant atherosclerotic disease. 4. 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 Serpiginous contrast enhancement was seen on the recent MRI. This enhancement may have been due to luxury  perfusion from recent infarct or cerebritis. Attention on follow-up MRI scheduled for today. 5. These results were called by telephone at the time of interpretation on 04/08/2021 at 1:02 pm to provider XU , who verbally acknowledged these results. Electronically Signed   By: ChaFranchot GalloD.   On: 04/08/2021 13:02   EEG adult  Result Date: 04/08/2021 StaDerek JackD     04/08/2021  7:45 PM Routine EEG Report JanChaquita Basques a 55 44o. female with a history of seizures who is undergoing an EEG to evaluate for seizures. Report: This EEG was acquired with electrodes placed according to the International 10-20 electrode system (including Fp1, Fp2, F3, F4, C3, C4, P3, P4, O1, O2, T3, T4, T5, T6, A1, A2, Fz, Cz, Pz). The following electrodes were missing or displaced: none. The occipital dominant rhythm was 6-7 Hz with intermittent more pronounced diffuse slowing. This activity is reactive to stimulation. Drowsiness was manifested by background fragmentation; deeper stages of sleep were not identified. There was no focal slowing. There are intermittent, at times frequent, sharp waves with increased rhythmicity of background activity over the left frontal region. There is EMG artifact throughout the recording which somewhat limits interpretation. With this caveat, there were no clear electrographic seizures identified. Photic stimulation and hyperventilation were not performed. Impression and clinical correlation: This EEG was obtained while awake and drowsy and is abnormal due to: - Mild-to-moderate diffuse slowing, indicative of global cerebral dysfunction - Intermittent, at times frequent, sharp waves with increased rhythmicity of background activity over the left frontal region, suggestive of increased epileptogenic potential in this region - Recording was degraded by EMG artifact, with that caveat no definitive electrographic seizures were identified ColSu MonksD [facility] [phone] 7pm- 7am, please page neurology on call as listed in [facility] Overnight EEG with video  Result Date: 04/09/2021 YadLora HavensD     04/10/2021  9:35 AM Patient Name: Martha DefibaughN: [id number] Attending: PriLora Havensferring Physician/Provider: Dr JinRosalin Hawkingration: 04/08/2021 1855 to 04/09/2021 1855 Patient history: 55 28o. female with PMH of hypertension, smoker and recent seizure presenting to ED for speech arrest, staring off and altered mental status.EEG to evaluate for seizure Level of alertness: Awake AEDs during EEG study: LEV Technical aspects: This EEG study was done with scalp electrodes positioned according to the 10-20 International system of electrode placement. Electrical activity was acquired at a sampling rate of 500Hz  and reviewed with a high frequency filter of 70Hz  and a low frequency filter of 1Hz . EEG data were recorded continuously and digitally stored. Description: No clear posterior dominant rhythm was seen.  EEG showed continuous generalized 3-5Hz  theta- delta slowing.  At the beginning  of the study, lateralized discharges were noted in left hemisphere at 2-2.5Hz  with superimposed rhythmicity.  Gradually, EEG started showing lateralized periodic discharges with overriding rhythmicity and frequency fluctuating between 2 to 3 Hz lasting 3 to 10 seconds followed by 1 to 2 seconds of generalized EEG attenuation.  This EEG was consistent with brief ictal-interictal rhythmic discharges (BIRDS). 1 seizure was noted on 04/09/2021 at 0917.  During the seizure, patient initially did not have any clinical signs.  She was lying in bed, looking at family members at bedside, occasionally moving her left arm and looking at it (did not look like typical automatism).  At 919, patient made a grunting sound followed by gaze deviation to the right as well as forced head deviation to the right.  This was followed by whole body stiffening and arching of back. Patient's family  member came in from the camera therefore, it was difficult to view the patient but it looked like patient was having a generalized tonic-clonic seizure.  Concomitant EEG initially showed 2.5 Hz periodic discharges in left hemisphere, maximal left frontotemporal region which gradually evolved in morphology and frequency and then involved the right hemisphere.  Total duration of seizure was 3 minutes. Hyperventilation and photic stimulation were not performed.   ABNORMALITY -Focal seizure, left frontotemporal region -Brief ictal-interictal rhythmic discharges, left hemisphere - Lateralized periodic discharges with superimposed rhythmicity ( LPD +R) left hemisphere - Continuous slow, generalized IMPRESSION: This study showed one focal motor seizure as described above on 04/09/2021 at 0917, arising from left frontotemporal region, lasting about 3 minutes. EEG also showed lateralized periodic discharges with superimposed rhythmicity as well as brief ictal-interictal rhythmic discharges in left hemisphere which is on the ictal--interictal continuum with high potential for seizure recurrance. Additionally, EEG is suggestive of moderate to severe diffuse encephalopathy, nonspecific to etiology. Dr Rory Percy was notified. Lora Havens   CT HEAD CODE STROKE WO CONTRAST  Result Date: 04/08/2021 CLINICAL DATA:  Code stroke. Acute neuro deficit. Slurred speech aphasia. EXAM: CT HEAD WITHOUT CONTRAST TECHNIQUE: Contiguous axial images were obtained from the base of the skull through the vertex without intravenous contrast. COMPARISON:  CT head 04/03/2021 FINDINGS: Brain: No evidence of acute infarction, hemorrhage, hydrocephalus, extra-axial collection or mass lesion/mass effect. Vascular: Negative for hyperdense vessel Skull: Negative Sinuses/Orbits: Negative Other: None ASPECTS (Iva Stroke Program Early CT Score) - Ganglionic level infarction (caudate, lentiform nuclei, internal capsule, insula, M1-M3 cortex): 7 -  Supraganglionic infarction (M4-M6 cortex): 3 Total score (0-10 with 10 being normal): 10 IMPRESSION: 1. Negative CT head 2. ASPECTS is 10 3. Code stroke imaging results were communicated on 04/08/2021 at 12:26 pm to provider Erlinda Hong via text page Electronically Signed   By: Franchot Gallo M.D.   On: 04/08/2021 12:26   CT ANGIO NECK CODE STROKE  Result Date: 04/08/2021 CLINICAL DATA:  Code stroke.  Bilateral cerebellar infarct. EXAM: CT ANGIOGRAPHY HEAD AND NECK CT PERFUSION BRAIN TECHNIQUE: Multidetector CT imaging of the head and neck was performed using the standard protocol during bolus administration of intravenous contrast. Multiplanar CT image reconstructions and MIPs were obtained to evaluate the vascular anatomy. Carotid stenosis measurements (when applicable) are obtained utilizing NASCET criteria, using the distal internal carotid diameter as the denominator. Multiphase CT imaging of the brain was performed following IV bolus contrast injection. Subsequent parametric perfusion maps were calculated using RAPID software. CONTRAST:  5m OMNIPAQUE IOHEXOL 350 MG/ML SOLN; 58mOMNIPAQUE IOHEXOL 350 MG/ML SOLN COMPARISON:  MRI head and CT head 04/08/2021 FINDINGS:  CTA NECK FINDINGS Aortic arch: Standard branching. Imaged portion shows no evidence of aneurysm or dissection. No significant stenosis of the major arch vessel origins. Right carotid system: Normal right carotid. Negative for atherosclerotic disease or dissection. Left carotid system: Normal left carotid. Negative for atherosclerotic disease or dissection. Tortuosity of the left internal carotid artery. Vertebral arteries: Normal vertebral arteries bilaterally. Skeleton: Cervical kyphosis which may be positional. No acute skeletal abnormality. Other neck: Negative Upper chest: Mild patchy airspace disease right upper lobe posteriorly. This may be scarring and is unchanged from chest CT of 11/10/2019 Review of the MIP images confirms the above findings  CTA HEAD FINDINGS Anterior circulation: Cavernous carotid patent bilaterally. Anterior and middle cerebral arteries patent bilaterally without stenosis or large vessel occlusion. Review of the prior MRI with contrast 04/04/2021. This demonstrated serpiginous enhancement in the left anterior frontal lobe. No vascular malformation is seen in this area on CTA. This may have been due to cerebritis or luxury perfusion from recent infarction on the prior MRI. Recommend attention on MRI scheduled for later today. Posterior circulation: Both vertebral arteries patent to the basilar. Basilar widely patent. Posterior cerebral arteries widely patent bilaterally. Venous sinuses: Normal venous enhancement. Anatomic variants: None Review of the MIP images confirms the above findings CT Brain Perfusion Findings: ASPECTS: 10 CBF (<30%) Volume: 35m Perfusion (Tmax>6.0s) volume: 093mMismatch Volume: 18m49mnfarction Location:None IMPRESSION: 1. CT perfusion negative for acute infarction. 2. Negative for intracranial large vessel occlusion 3. Normal carotid and vertebral arteries bilaterally. No significant atherosclerotic disease. 4. 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 Serpiginous contrast enhancement was seen on the recent MRI. This enhancement may have been due to luxury perfusion from recent infarct or cerebritis. Attention on follow-up MRI scheduled for today. 5. These results were called by telephone at the time of interpretation on 04/08/2021 at 1:02 pm to provider XU , who verbally acknowledged these results. Electronically Signed   By: ChaFranchot GalloD.   On: 04/08/2021 13:02       LOS: 2 days   [facility] Pager on [email or web address]  04/10/2021, 10:31 AM

## 2021-04-10 NOTE — Progress Notes (Signed)
°  Transition of Care [facility]) Screening Note   Patient Details  Name: Peyten Punches Date of Birth: 02-Oct-1965   Transition of Care [facility]) CM/SW Contact:    Kermit Balo, RN Phone Number: 04/10/2021, 10:49 AM    Transition of Care Department [facility]) has reviewed patient. We will continue to monitor patient advancement through interdisciplinary progression rounds. If new patient transition needs arise, please place a TOC consult.

## 2021-04-10 NOTE — Progress Notes (Signed)
EEG maintanence. No skin breakdown noted.

## 2021-04-10 NOTE — Progress Notes (Signed)
Attempted MRI. Pt able to cooperate to hold still for exam. Attempted to redirect her. Given meds to try to assist with getting the exam done. Pt still unable to hold still or follow commands to hold still. Will send without images- did not get complete exam and unable to get contrast portion of the exam. Called RN and decided to send pt back.

## 2021-04-10 NOTE — Procedures (Signed)
Patient Name: Martha Schneider  MRN: [id number]  Epilepsy Attending: Charlsie Quest  Referring Physician/Provider: Dr Marvel Plan Duration: 04/09/2021 1855 to 04/10/2021 1855   Patient history: 54 y.o. female with PMH of hypertension, smoker and recent seizure presenting to ED for speech arrest, staring off and altered mental status.EEG to evaluate for seizure   Level of alertness: Awake   AEDs during EEG study: LEV   Technical aspects: This EEG study was done with scalp electrodes positioned according to the 10-20 International system of electrode placement. Electrical activity was acquired at a sampling rate of 500Hz  and reviewed with a high frequency filter of 70Hz  and a low frequency filter of 1Hz . EEG data were recorded continuously and digitally stored.    Description: No clear posterior dominant rhythm was seen.  EEG showed continuous generalized 3-5 hz theta- delta slowing.     EEG showed lateralized periodic discharges with overriding fast activity in left hemisphere at 1-2Hz . At times, lateralized periodic discharges also had overriding rhythmicity and frequency fluctuating between 2 to 3 Hz lasting 3 to 10 seconds followed by 1 to 2 seconds of generalized EEG attenuation.  This EEG was consistent with brief ictal-interictal rhythmic discharges (BIRDS).    Hyperventilation and photic stimulation were not performed.      ABNORMALITY -Brief ictal-interictal rhythmic discharges, left hemisphere - Lateralized periodic discharges with with overriding fast activity ( LPD +) left hemisphere - Continuous slow, generalized   IMPRESSION: This study showed lateralized periodic discharges with overriding fast activity as well as brief ictal-interictal rhythmic discharges in left hemisphere which is on the ictal-interictal continuum with high potential for seizure recurrance.    Additionally, EEG is suggestive of moderate diffuse encephalopathy, nonspecific to etiology but likely due to  seizures.   Carey Johndrow 

## 2021-04-10 NOTE — Evaluation (Signed)
Clinical/Bedside Swallow Evaluation Patient Details  Name: Martha Schneider MRN: [id number] Date of Birth: [birth year]  Today's Date: 04/10/2021 Time: SLP Start Time (ACUTE ONLY): 1240 SLP Stop Time (ACUTE ONLY): 1310 SLP Time Calculation (min) (ACUTE ONLY): 30 min  Past Medical History:  Past Medical History:  Diagnosis Date   Depression    Hypertension    Sarcoidosis    Past Surgical History: No past surgical history on file. HPI:  55yo female admitted 04/08/21 with AMS. PMH: essential htn, depression, sarcoidosis, chronic leukopenia, adm 12/6-10/2020 with new onset seizure    Assessment / Plan / Recommendation  Clinical Impression  Pt seen at bedside for assessment of swallow function and safety. Pt was seated in bed eating lunch upon arrival of SLP. Her sister in law was present. CN exam is unremarkable. Pt has upper and lower dentures. Pt was observed self feeding a cheeseburger and drinking thin liquids. She had some difficulty with self feeding, possibly due to confusion and/or weakness. Pt did not exhibit obvious oral issues or overt s/s aspiration on any texture. Will continue current diet. No further ST intervention recommended at this time for dysphagia. Please reconsult if needs arise.  SLP Visit Diagnosis: Dysphagia, unspecified (R13.10)    Aspiration Risk  Mild aspiration risk    Diet Recommendation Regular;Thin liquid   Liquid Administration via: Cup;Straw Medication Administration: Whole meds with liquid Supervision: Patient able to self feed;Full supervision/cueing for compensatory strategies Compensations: Minimize environmental distractions;Slow rate;Small sips/bites Postural Changes: Seated upright at 90 degrees;Remain upright for at least 30 minutes after po intake    Other  Recommendations Oral Care Recommendations: Oral care BID    Recommendations for follow up therapy are one component of a multi-disciplinary discharge planning process, led by the attending  physician.  Recommendations may be updated based on patient status, additional functional criteria and insurance authorization.  Follow up Recommendations No SLP follow up      Assistance Recommended at Discharge None  Functional Status Assessment Patient has had a recent decline in their functional status and demonstrates the ability to make significant improvements in function in a reasonable and predictable amount of time.      Prognosis Prognosis for Safe Diet Advancement: Good      Swallow Study   General Date of Onset: 04/08/21 HPI: 55yo female admitted 04/08/21 with AMS. PMH: essential htn, depression, sarcoidosis, chronic leukopenia, adm 12/6-10/2020 with new onset seizure Type of Study: Bedside Swallow Evaluation Previous Swallow Assessment: none Diet Prior to this Study: Regular;Thin liquids Temperature Spikes Noted: No Respiratory Status: Room air History of Recent Intubation: No Behavior/Cognition: Alert;Cooperative;Pleasant mood;Confused Oral Cavity Assessment: Within Functional Limits Oral Care Completed by SLP: No Oral Cavity - Dentition: Dentures, top;Dentures, bottom Vision: Functional for self-feeding Self-Feeding Abilities: Able to feed self;Needs assist;Needs set up Patient Positioning: Upright in bed Baseline Vocal Quality: Normal;Other (comment) (low vocal pitch at baseline) Volitional Cough: Strong Volitional Swallow: Able to elicit    Oral/Motor/Sensory Function Overall Oral Motor/Sensory Function: Within functional limits   Ice Chips Ice chips: Not tested   Thin Liquid Thin Liquid: Within functional limits Presentation: Straw    Nectar Thick Nectar Thick Liquid: Not tested   Honey Thick Honey Thick Liquid: Not tested   Puree Puree: Within functional limits Presentation: Self Fed   Solid     Solid: Within functional limits Presentation: Self Fed     Martha Schneider B. Murvin Natal, [facility], CCC-SLP Speech Language Pathologist Office: [phone]  Leigh Aurora 04/10/2021,1:17 PM

## 2021-04-11 LAB — BASIC METABOLIC PANEL
Anion gap: 9 (ref 5–15)
BUN: 6 mg/dL (ref 6–20)
CO2: 24 mmol/L (ref 22–32)
Calcium: 8.7 mg/dL — ABNORMAL LOW (ref 8.9–10.3)
Chloride: 100 mmol/L (ref 98–111)
Creatinine, Ser: 0.5 mg/dL (ref 0.44–1.00)
GFR, Estimated: >60 mL/min (ref >60)
Glucose, Bld: 81 mg/dL (ref 70–99)
Potassium: 3.1 mmol/L — ABNORMAL LOW (ref 3.5–5.1)
Sodium: 133 mmol/L — ABNORMAL LOW (ref 135–145)

## 2021-04-11 LAB — MAGNESIUM: Magnesium: 1.6 mg/dL — ABNORMAL LOW (ref 1.7–2.4)

## 2021-04-11 MED ORDER — MAGNESIUM SULFATE 2 GM/50ML IV SOLN
2.0000 g | Freq: Once | INTRAVENOUS | Status: AC
  Administered 2021-04-11: 09:00:00 2 g via INTRAVENOUS
  Filled 2021-04-11: qty 50

## 2021-04-11 MED ORDER — LEVETIRACETAM 500 MG PO TABS: 1500.0000 mg | ORAL_TABLET | Freq: Two times a day (BID) | ORAL | 2 refills | Status: DC

## 2021-04-11 MED ORDER — POTASSIUM CHLORIDE 20 MEQ PO PACK
40.0000 meq | PACK | ORAL | Status: AC
  Administered 2021-04-11 (×2): 40 meq via ORAL
  Filled 2021-04-11 (×2): qty 2

## 2021-04-11 NOTE — Progress Notes (Signed)
LTM EEG discontinued - no skin breakdown at unhook.   

## 2021-04-11 NOTE — Procedures (Addendum)
Patient Name: Martha Schneider  MRN: [id number]  Epilepsy Attending: Charlsie Quest  Referring Physician/Provider: Dr Marvel Plan Duration: 04/10/2021 1855 to 04/11/2021 911    Patient history: 55 y.o. female with PMH of hypertension, smoker and recent seizure presenting to ED for speech arrest, staring off and altered mental status.EEG to evaluate for seizure   Level of alertness: Awake, asleep   AEDs during EEG study: LEV   Technical aspects: This EEG study was done with scalp electrodes positioned according to the 10-20 International system of electrode placement. Electrical activity was acquired at a sampling rate of 500Hz  and reviewed with a high frequency filter of 70Hz  and a low frequency filter of 1Hz . EEG data were recorded continuously and digitally stored.    Description: No clear posterior dominant rhythm was seen. Sleep was characterized by sleep spindles (12 to 14 Hz), maximal frontal temporal region. EEG showed continuous generalized 3-5 hz theta- delta slowing .     EEG showed lateralized periodic discharges with overriding fast activity in left hemisphere at 1-1.5Hz . At times, lateralized periodic discharges also had overriding rhythmicity.    Hyperventilation and photic stimulation were not performed.      ABNORMALITY - Lateralized periodic discharges with with overriding fast activity ( LPD +) left hemisphere, maximal left frontal region - Continuous slow, generalized   IMPRESSION: This study showed lateralized periodic discharges with overriding fast activity in left hemisphere, maximal left frontal region which is on the ictal-interictal continuum with intermediate potential for seizure recurrance.    Additionally, EEG is suggestive of moderate diffuse encephalopathy, nonspecific to etiology but likely due to seizures.  EEG appears to be improving compared to previous day.   Nicolas Banh 

## 2021-04-11 NOTE — TOC Transition Note (Signed)
Transition of Care [facility]) - CM/SW Discharge Note   Patient Details  Name: Martha Schneider MRN: [id number] Date of Birth: 1966-02-13  Transition of [facility]) CM/SW Contact:  Kermit Balo, RN Phone Number: 04/11/2021, 1:48 PM   Clinical Narrative:    Patient is discharging home with self care. No needs per TOC.  Pt denies issues with home medications or transportation. Spouse providing transport home today. Pt lives with son and spouse.    Final next level of care: Home/Self Care Barriers to Discharge: No Barriers Identified   Patient Goals and CMS Choice        Discharge Placement                       Discharge Plan and Services                                     Social Determinants of Health (SDOH) Interventions     Readmission Risk Interventions No flowsheet data found.

## 2021-04-11 NOTE — Discharge Summary (Signed)
Physician Discharge Summary  Martha Schneider XBJ:[id number] DOB: 07-Oct-1965 DOA: 04/08/2021  PCP: Darrow Bussing, MD  Admit date: 04/08/2021 Discharge date: 04/11/2021  Admitted From: Home Discharge disposition: Home   Code Status: Full Code  Diet Recommendation: Cardiac diet  Discharge Diagnosis:   Principal Problem:   Recurrent seizures (HCC) Active Problems:   Chronic leukopenia   Hypertension   Depression   Tobacco abuse   Seizure (HCC)   Hypocalcemia   Hypoxia   History of Present Illness / Brief narrative:  Patient is a 55 y.o. female with medical history significant for hypertension, chronic leukopenia, sarcoidosis, tobacco abuse, and depression  Patient presented to the ED on 12/11 with complaint of acute change in mental status.   Patient was recently hospitalized 12/6-12/7 for new onset seizure disorder and was discharged on Keppra.  Per family, patient was compliant to Keppra.  Her initial presentation was suspicious for stroke.  CT head negative for any acute stroke.  CT perfusion imaging and CT angio of head and neck were negative for any vascular stenosis or occlusion. Admitted to hospitalist service. Neurology was consulted. In light of her recent history of seizure, recurrence of seizure was considered.  Keppra dose was increased and long-term EEG monitoring was done.  Subjective:  Seen and examined this morning.  Alert, awake, feels better. Feels ready to go home today.  Hospital Course:  Seizure disorder with breakthrough seizures -Recently discharged on December 7 on Keppra.  Presented with recurrence of seizure.  Neurology was consulted.   -MRI brain did not show any evidence of acute stroke.  -Long-term EEG monitoring was done.  Per neurology note from this morning, EEG continues to show lateralized periodic discharges from left hemisphere but he did improved from previous days.  Okay to discharge home per neurology on Keppra 1500 mg twice  daily.  Acute metabolic encephalopathy -Primarily due to postictal confusion due to seizure -Mental status much better.   Hypertensive urgency -Initial blood pressures were elevated up to 186/110.  It was probably because of seizure and agitation. -Currently blood pressure is controlled on amlodipine as she was taking before.  Chronic daily alcohol abuse Apparently drinks 4-5 beers every day.  Could be contributing to her seizures.  No withdrawal symptoms in the hospital.  Counseled to quit.   History of depression -On Zoloft  History of sarcoidosis Follow-up with pulmonology   Tobacco abuse Nicotine patch  Discharge Medications:   Allergies as of 04/11/2021   No Known Allergies      Medication List     STOP taking these medications    dextromethorphan-guaiFENesin 30-600 MG 12hr tablet Commonly known as: MUCINEX DM       TAKE these medications    amLODipine 10 MG tablet Commonly known as: NORVASC Take 10 mg by mouth daily.   aspirin 325 MG tablet Take 325 mg by mouth daily.   levETIRAcetam 500 MG tablet Commonly known as: KEPPRA Take 3 tablets (1,500 mg total) by mouth 2 (two) times daily. What changed: how much to take   nicotine 14 mg/24hr patch Commonly known as: NICODERM CQ - dosed in mg/24 hours Place 1 patch (14 mg total) onto the skin daily as needed (smoking cessation).   sertraline 100 MG tablet Commonly known as: ZOLOFT Take 100 mg by mouth daily.        Wound care:     Discharge Instructions:   Discharge Instructions     Call MD for:  difficulty breathing, headache or  visual disturbances   Complete by: As directed    Call MD for:  extreme fatigue   Complete by: As directed    Call MD for:  hives   Complete by: As directed    Call MD for:  persistant dizziness or light-headedness   Complete by: As directed    Call MD for:  persistant nausea and vomiting   Complete by: As directed    Call MD for:  severe uncontrolled pain    Complete by: As directed    Call MD for:  temperature >100.4   Complete by: As directed    Diet - low sodium heart healthy   Complete by: As directed    Discharge instructions   Complete by: As directed    Seizure precautions: -Per [facility] statutes, patients with seizures are not allowed to drive until they have been seizure-free for six months.  -Use caution when using heavy equipment or power tools.  -Avoid working on ladders or at heights.  -Take showers instead of baths. Ensure the water temperature is not too high on the home water heater.  -Do not go swimming alone.  -Do not lock yourself in a room alone (i.e. bathroom). -When caring for infants or small children, sit down when holding, feeding, or changing them to minimize risk of injury to the child in the event you have a seizure.  -Maintain good sleep hygiene. -Avoid alcohol.    If patient has another seizure, call 911 and bring them back to the ED if: A.  The seizure lasts longer than 5 minutes.      B.  The patient doesn't wake shortly after the seizure or has new problems such as difficulty seeing, speaking or moving following the seizure C.  The patient was injured during the seizure D.  The patient has a temperature over 102 F (39C) E.  The patient vomited during the seizure and now is having trouble breathing    General discharge instructions:  Follow with Primary MD Koirala, Dibas, MD in 7 days   Get CBC/BMP checked in next visit within 1 week by PCP or SNF MD. (We routinely change or add medications that can affect your baseline labs and fluid status, therefore we recommend that you get the mentioned basic workup next visit with your PCP, your PCP may decide not to get them or add new tests based on their clinical decision)  On your next visit with your PCP, please get your medicines reviewed and adjusted.  Please request your PCP  to go over all hospital tests, procedures, radiology results at the  follow up, please get all Hospital records sent to your PCP by signing hospital release before you go home.  Activity: As tolerated with Full fall precautions use walker/cane & assistance as needed  Avoid using any recreational substances like cigarette, tobacco, alcohol, or non-prescribed drug.  If you experience worsening of your admission symptoms, develop shortness of breath, life threatening emergency, suicidal or homicidal thoughts you must seek medical attention immediately by calling 911 or calling your MD immediately  if symptoms less severe.  You must read complete instructions/literature along with all the possible adverse reactions/side effects for all the medicines you take and that have been prescribed to you. Take any new medicine only after you have completely understood and accepted all the possible adverse reactions/side effects.   Do not drive, operate heavy machinery, perform activities at heights, swimming or participation in water activities or provide baby  sitting services if your were admitted for syncope or siezures until you have seen by Primary MD or a Neurologist and advised to do so again.  Do not drive when taking Pain medications.  Do not take more than prescribed Pain, Sleep and Anxiety Medications  Wear Seat belts while driving.  Please note You were cared for by a hospitalist during your hospital stay. If you have any questions about your discharge medications or the care you received while you were in the hospital after you are discharged, you can call the unit and asked to speak with the hospitalist on call if the hospitalist that took care of you is not available. Once you are discharged, your primary care physician will handle any further medical issues. Please note that NO REFILLS for any discharge medications will be authorized once you are discharged, as it is imperative that you return to your primary care physician (or establish a relationship with a  primary care physician if you do not have one) for your aftercare needs so that they can reassess your need for medications and monitor your lab values.   Increase activity slowly   Complete by: As directed        Follow ups:    Follow-up Information     Koirala, Dibas, MD Follow up.   Specialty: Family Medicine Contact information: [address] [place] [address] [phone]                 Discharge Exam:   Vitals:   04/10/21 2320 04/11/21 0313 04/11/21 0742 04/11/21 1120  BP: (!) 147/104 125/90 (!) 143/95 135/90  Pulse: 76 70 67 75  Resp: 19 18 14 14   Temp: 98.3 F (36.8 C) 97.6 F (36.4 C)  98 F (36.7 C)  TempSrc: Oral Oral  Oral  SpO2: 100% 100% 100% 100%  Weight:      Height:        Body mass index is 19.31 kg/m.  General exam: Pleasant, middle-aged African-American female.  Not in distress.  No new symptoms Skin: No rashes, lesions or ulcers. HEENT: Atraumatic, normocephalic, no obvious bleeding Lungs: Clear to auscultation bilaterally CVS: Regular rate and rhythm, no murmur GI/Abd soft, nontender, nondistended, bowel sound present CNS: Alert, awake, oriented x3 Psychiatry: Mood appropriate Extremities: No pedal edema, no calf tenderness  Time coordinating discharge: 35 minutes   The results of significant diagnostics from this hospitalization (including imaging, microbiology, ancillary and laboratory) are listed below for reference.    Procedures and Diagnostic Studies:   MR BRAIN WO CONTRAST  Result Date: 04/08/2021 CLINICAL DATA:  Stroke follow-up. EXAM: MRI HEAD WITHOUT CONTRAST TECHNIQUE: Multiplanar, multiecho pulse sequences of the brain and surrounding structures were obtained without intravenous contrast. COMPARISON:  CT angio head and neck 04/08/2021.  MRI head 04/04/2021 FINDINGS: Brain: Limited study. Diffusion-weighted imaging only was performed. No areas of restricted diffusion. No acute infarct. There is  susceptibility in the central pons most compatible with cavernoma. This is unchanged from the prior MRI. Review of the recent MRI reveals increased FLAIR signal in the left anterior frontal lobe with associated serpiginous enhancement. Follow-up recommended. This area does not show restricted diffusion. Possible seizure related finding. IMPRESSION: Negative for acute infarct. Limited to diffusion weighted imaging only. Electronically Signed   By: Marlan Palau M.D.   On: 04/08/2021 13:37   CT CEREBRAL PERFUSION W CONTRAST  Result Date: 04/08/2021 CLINICAL DATA:  Code stroke.  Bilateral cerebellar infarct. EXAM: CT ANGIOGRAPHY  HEAD AND NECK CT PERFUSION BRAIN TECHNIQUE: Multidetector CT imaging of the head and neck was performed using the standard protocol during bolus administration of intravenous contrast. Multiplanar CT image reconstructions and MIPs were obtained to evaluate the vascular anatomy. Carotid stenosis measurements (when applicable) are obtained utilizing NASCET criteria, using the distal internal carotid diameter as the denominator. Multiphase CT imaging of the brain was performed following IV bolus contrast injection. Subsequent parametric perfusion maps were calculated using RAPID software. CONTRAST:  89mL OMNIPAQUE IOHEXOL 350 MG/ML SOLN; 51mL OMNIPAQUE IOHEXOL 350 MG/ML SOLN COMPARISON:  MRI head and CT head 04/08/2021 FINDINGS: CTA NECK FINDINGS Aortic arch: Standard branching. Imaged portion shows no evidence of aneurysm or dissection. No significant stenosis of the major arch vessel origins. Right carotid system: Normal right carotid. Negative for atherosclerotic disease or dissection. Left carotid system: Normal left carotid. Negative for atherosclerotic disease or dissection. Tortuosity of the left internal carotid artery. Vertebral arteries: Normal vertebral arteries bilaterally. Skeleton: Cervical kyphosis which may be positional. No acute skeletal abnormality. Other neck: Negative  Upper chest: Mild patchy airspace disease right upper lobe posteriorly. This may be scarring and is unchanged from chest CT of 11/10/2019 Review of the MIP images confirms the above findings CTA HEAD FINDINGS Anterior circulation: Cavernous carotid patent bilaterally. Anterior and middle cerebral arteries patent bilaterally without stenosis or large vessel occlusion. Review of the prior MRI with contrast 04/04/2021. This demonstrated serpiginous enhancement in the left anterior frontal lobe. No vascular malformation is seen in this area on CTA. This may have been due to cerebritis or luxury perfusion from recent infarction on the prior MRI. Recommend attention on MRI scheduled for later today. Posterior circulation: Both vertebral arteries patent to the basilar. Basilar widely patent. Posterior cerebral arteries widely patent bilaterally. Venous sinuses: Normal venous enhancement. Anatomic variants: None Review of the MIP images confirms the above findings CT Brain Perfusion Findings: ASPECTS: 10 CBF (<30%) Volume: 78mL Perfusion (Tmax>6.0s) volume: 76mL Mismatch Volume: 30mL Infarction Location:None IMPRESSION: 1. CT perfusion negative for acute infarction. 2. Negative for intracranial large vessel occlusion 3. Normal carotid and vertebral arteries bilaterally. No significant atherosclerotic disease. 4. 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 Serpiginous contrast enhancement was seen on the recent MRI. This enhancement may have been due to luxury perfusion from recent infarct or cerebritis. Attention on follow-up MRI scheduled for today. 5. These results were called by telephone at the time of interpretation on 04/08/2021 at 1:02 pm to provider XU , who verbally acknowledged these results. Electronically Signed   By: Marlan Palau M.D.   On: 04/08/2021 13:02   EEG adult  Result Date: 04/08/2021 Jefferson Fuel, MD     04/08/2021  7:45 PM Routine EEG Report Martha Schneider is a 55 y.o. female  with a history of seizures who is undergoing an EEG to evaluate for seizures. Report: This EEG was acquired with electrodes placed according to the International 10-20 electrode system (including Fp1, Fp2, F3, F4, C3, C4, P3, P4, O1, O2, T3, T4, T5, T6, A1, A2, Fz, Cz, Pz). The following electrodes were missing or displaced: none. The occipital dominant rhythm was 6-7 Hz with intermittent more pronounced diffuse slowing. This activity is reactive to stimulation. Drowsiness was manifested by background fragmentation; deeper stages of sleep were not identified. There was no focal slowing. There are intermittent, at times frequent, sharp waves with increased rhythmicity of background activity over the left frontal region. There is EMG artifact throughout the recording which somewhat  limits interpretation. With this caveat, there were no clear electrographic seizures identified. Photic stimulation and hyperventilation were not performed. Impression and clinical correlation: This EEG was obtained while awake and drowsy and is abnormal due to: - Mild-to-moderate diffuse slowing, indicative of global cerebral dysfunction - Intermittent, at times frequent, sharp waves with increased rhythmicity of background activity over the left frontal region, suggestive of increased epileptogenic potential in this region - Recording was degraded by EMG artifact, with that caveat no definitive electrographic seizures were identified Bing Neighbors, MD [facility] [phone] If 7pm- 7am, please page neurology on call as listed in AMION.   Overnight EEG with video  Result Date: 04/09/2021 Charlsie Quest, MD     04/10/2021  9:35 AM Patient Name: Martha Schneider MRN: [id number] Epilepsy Attending: Charlsie Quest Referring Physician/Provider: Dr Marvel Plan Duration: 04/08/2021 1855 to 04/09/2021 1855 Patient history: 55 y.o. female with PMH of hypertension, smoker and recent seizure presenting to ED for speech arrest,  staring off and altered mental status.EEG to evaluate for seizure Level of alertness: Awake AEDs during EEG study: LEV Technical aspects: This EEG study was done with scalp electrodes positioned according to the 10-20 International system of electrode placement. Electrical activity was acquired at a sampling rate of  and reviewed with a high frequency filter of  and a low frequency filter of . EEG data were recorded continuously and digitally stored. Description: No clear posterior dominant rhythm was seen.  EEG showed continuous generalized 3-5Hz  theta- delta slowing.  At the beginning of the study, lateralized discharges were noted in left hemisphere at 2-2.5Hz  with superimposed rhythmicity.  Gradually, EEG started showing lateralized periodic discharges with overriding rhythmicity and frequency fluctuating between 2 to 3 Hz lasting 3 to 10 seconds followed by 1 to 2 seconds of generalized EEG attenuation.  This EEG was consistent with brief ictal-interictal rhythmic discharges (BIRDS). 1 seizure was noted on 04/09/2021 at 0917.  During the seizure, patient initially did not have any clinical signs.  She was lying in bed, looking at family members at bedside, occasionally moving her left arm and looking at it (did not look like typical automatism).  At 919, patient made a grunting sound followed by gaze deviation to the right as well as forced head deviation to the right.  This was followed by whole body stiffening and arching of back. Patient's family member came in from the camera therefore, it was difficult to view the patient but it looked like patient was having a generalized tonic-clonic seizure.  Concomitant EEG initially showed 2.5 Hz periodic discharges in left hemisphere, maximal left frontotemporal region which gradually evolved in morphology and frequency and then involved the right hemisphere.  Total duration of seizure was 3 minutes. Hyperventilation and photic stimulation were not  performed.   ABNORMALITY -Focal seizure, left frontotemporal region -Brief ictal-interictal rhythmic discharges, left hemisphere - Lateralized periodic discharges with superimposed rhythmicity ( LPD +R) left hemisphere - Continuous slow, generalized IMPRESSION: This study showed one focal motor seizure as described above on 04/09/2021 at 0917, arising from left frontotemporal region, lasting about 3 minutes. EEG also showed lateralized periodic discharges with superimposed rhythmicity as well as brief ictal-interictal rhythmic discharges in left hemisphere which is on the ictal--interictal continuum with high potential for seizure recurrance. Additionally, EEG is suggestive of moderate to severe diffuse encephalopathy, nonspecific to etiology. Dr Wilford Corner was notified. Charlsie Quest   CT HEAD CODE STROKE WO CONTRAST  Result Date: 04/08/2021 CLINICAL DATA:  Code stroke.  Acute neuro deficit. Slurred speech aphasia. EXAM: CT HEAD WITHOUT CONTRAST TECHNIQUE: Contiguous axial images were obtained from the base of the skull through the vertex without intravenous contrast. COMPARISON:  CT head 04/03/2021 FINDINGS: Brain: No evidence of acute infarction, hemorrhage, hydrocephalus, extra-axial collection or mass lesion/mass effect. Vascular: Negative for hyperdense vessel Skull: Negative Sinuses/Orbits: Negative Other: None ASPECTS (Alberta Stroke Program Early CT Score) - Ganglionic level infarction (caudate, lentiform nuclei, internal capsule, insula, M1-M3 cortex): 7 - Supraganglionic infarction (M4-M6 cortex): 3 Total score (0-10 with 10 being normal): 10 IMPRESSION: 1. Negative CT head 2. ASPECTS is 10 3. Code stroke imaging results were communicated on 04/08/2021 at 12:26 pm to provider Roda Shutters via text page Electronically Signed   By: Marlan Palau M.D.   On: 04/08/2021 12:26   CT ANGIO NECK CODE STROKE  Result Date: 04/08/2021 CLINICAL DATA:  Code stroke.  Bilateral cerebellar infarct. EXAM: CT ANGIOGRAPHY HEAD  AND NECK CT PERFUSION BRAIN TECHNIQUE: Multidetector CT imaging of the head and neck was performed using the standard protocol during bolus administration of intravenous contrast. Multiplanar CT image reconstructions and MIPs were obtained to evaluate the vascular anatomy. Carotid stenosis measurements (when applicable) are obtained utilizing NASCET criteria, using the distal internal carotid diameter as the denominator. Multiphase CT imaging of the brain was performed following IV bolus contrast injection. Subsequent parametric perfusion maps were calculated using RAPID software. CONTRAST:  40mL OMNIPAQUE IOHEXOL 350 MG/ML SOLN; 50mL OMNIPAQUE IOHEXOL 350 MG/ML SOLN COMPARISON:  MRI head and CT head 04/08/2021 FINDINGS: CTA NECK FINDINGS Aortic arch: Standard branching. Imaged portion shows no evidence of aneurysm or dissection. No significant stenosis of the major arch vessel origins. Right carotid system: Normal right carotid. Negative for atherosclerotic disease or dissection. Left carotid system: Normal left carotid. Negative for atherosclerotic disease or dissection. Tortuosity of the left internal carotid artery. Vertebral arteries: Normal vertebral arteries bilaterally. Skeleton: Cervical kyphosis which may be positional. No acute skeletal abnormality. Other neck: Negative Upper chest: Mild patchy airspace disease right upper lobe posteriorly. This may be scarring and is unchanged from chest CT of 11/10/2019 Review of the MIP images confirms the above findings CTA HEAD FINDINGS Anterior circulation: Cavernous carotid patent bilaterally. Anterior and middle cerebral arteries patent bilaterally without stenosis or large vessel occlusion. Review of the prior MRI with contrast 04/04/2021. This demonstrated serpiginous enhancement in the left anterior frontal lobe. No vascular malformation is seen in this area on CTA. This may have been due to cerebritis or luxury perfusion from recent infarction on the prior  MRI. Recommend attention on MRI scheduled for later today. Posterior circulation: Both vertebral arteries patent to the basilar. Basilar widely patent. Posterior cerebral arteries widely patent bilaterally. Venous sinuses: Normal venous enhancement. Anatomic variants: None Review of the MIP images confirms the above findings CT Brain Perfusion Findings: ASPECTS: 10 CBF (<30%) Volume: 0mL Perfusion (Tmax>6.0s) volume: 0mL Mismatch Volume: 0mL Infarction Location:None IMPRESSION: 1. CT perfusion negative for acute infarction. 2. Negative for intracranial large vessel occlusion 3. Normal carotid and vertebral arteries bilaterally. No significant atherosclerotic disease. 4. 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 Serpiginous contrast enhancement was seen on the recent MRI. This enhancement may have been due to luxury perfusion from recent infarct or cerebritis. Attention on follow-up MRI scheduled for today. 5. These results were called by telephone at the time of interpretation on 04/08/2021 at 1:02 pm to provider XU , who verbally acknowledged these results. Electronically Signed   By: Marlan Palau  M.D.   On: 04/08/2021 13:02     Labs:   Basic Metabolic Panel: Recent Labs  Lab 04/08/21 1206 04/08/21 1212 04/10/21 0230 04/11/21 0333  NA 134* 141 136 133*  K 3.8 4.2 3.6 3.1*  CL 98 102 100 100  CO2 29  --  25 24  GLUCOSE 119* 84 84 81  BUN 9 11 10 6   CREATININE 0.59 0.50 0.68 0.50  CALCIUM 8.9  --  9.2 8.7*  MG  --   --   --  1.6*   GFR Estimated Creatinine Clearance: 63.2 mL/min (by C-G formula based on SCr of 0.5 mg/dL). Liver Function Tests: Recent Labs  Lab 04/08/21 1206  AST 32  ALT 18  ALKPHOS 69  BILITOT 0.6  PROT 8.3*  ALBUMIN 4.4   No results for input(s): LIPASE, AMYLASE in the last 168 hours. No results for input(s): AMMONIA in the last 168 hours. Coagulation profile Recent Labs  Lab 04/08/21 1206  INR 1.0    CBC: Recent Labs  Lab  04/08/21 1206 04/08/21 1212 04/09/21 0529  WBC 3.4*  --  3.0*  NEUTROABS 2.3  --   --   HGB 14.1 15.3* 13.3  HCT 43.1 45.0 40.3  MCV 89.0  --  87.8  PLT 240  --  230   Cardiac Enzymes: No results for input(s): CKTOTAL, CKMB, CKMBINDEX, TROPONINI in the last 168 hours. BNP: Invalid input(s): POCBNP CBG: No results for input(s): GLUCAP in the last 168 hours. D-Dimer No results for input(s): DDIMER in the last 72 hours. Hgb A1c No results for input(s): HGBA1C in the last 72 hours. Lipid Profile No results for input(s): CHOL, HDL, LDLCALC, TRIG, CHOLHDL, LDLDIRECT in the last 72 hours. Thyroid function studies No results for input(s): TSH, T4TOTAL, T3FREE, THYROIDAB in the last 72 hours.  Invalid input(s): FREET3 Anemia work up No results for input(s): VITAMINB12, FOLATE, FERRITIN, TIBC, IRON, RETICCTPCT in the last 72 hours. Microbiology Recent Results (from the past 240 hour(s))  Resp Panel by RT-PCR (Flu A&B, Covid) Nasopharyngeal Swab     Status: None   Collection Time: 04/03/21  9:28 PM   Specimen: Nasopharyngeal Swab; Nasopharyngeal(NP) swabs in vial transport medium  Result Value Ref Range Status   SARS Coronavirus 2 by RT PCR NEGATIVE NEGATIVE Final    Comment: (NOTE) SARS-CoV-2 target nucleic acids are NOT DETECTED.  The SARS-CoV-2 RNA is generally detectable in upper respiratory specimens during the acute phase of infection. The lowest concentration of SARS-CoV-2 viral copies this assay can detect is 138 copies/mL. A negative result does not preclude SARS-Cov-2 infection and should not be used as the sole basis for treatment or other patient management decisions. A negative result may occur with  improper specimen collection/handling, submission of specimen other than nasopharyngeal swab, presence of viral mutation(s) within the areas targeted by this assay, and inadequate number of viral copies(<138 copies/mL). A negative result must be combined with clinical  observations, patient history, and epidemiological information. The expected result is Negative.  Fact Sheet for Patients:  14/06/22  Fact Sheet for Healthcare Providers:  BloggerCourse.com  This test is no t yet approved or cleared by the [place] FDA and  has been authorized for detection and/or diagnosis of SARS-CoV-2 by FDA under an Emergency Use Authorization (EUA). This EUA will remain  in effect (meaning this test can be used) for the duration of the COVID-19 declaration under Section 564(b)(1) of the Act, 21 U.S.C.section 360bbb-3(b)(1), unless the authorization is terminated  or revoked sooner.       Influenza A by PCR NEGATIVE NEGATIVE Final   Influenza B by PCR NEGATIVE NEGATIVE Final    Comment: (NOTE) The Xpert Xpress SARS-CoV-2/FLU/RSV plus assay is intended as an aid in the diagnosis of influenza from Nasopharyngeal swab specimens and should not be used as a sole basis for treatment. Nasal washings and aspirates are unacceptable for Xpert Xpress SARS-CoV-2/FLU/RSV testing.  Fact Sheet for Patients: BloggerCourse.com  Fact Sheet for Healthcare Providers: [employer]  This test is not yet approved or cleared by the [place] FDA and has been authorized for detection and/or diagnosis of SARS-CoV-2 by FDA under an Emergency Use Authorization (EUA). This EUA will remain in effect (meaning this test can be used) for the duration of the COVID-19 declaration under Section 564(b)(1) of the Act, 21 U.S.C. section 360bbb-3(b)(1), unless the authorization is terminated or revoked.  Performed at [facility], [address] [place], [place] [address]   Resp Panel by RT-PCR (Flu A&B, Covid) Nasopharyngeal Swab     Status: None   Collection Time: 04/08/21  1:54 PM   Specimen: Nasopharyngeal Swab; Nasopharyngeal(NP) swabs in vial transport medium   Result Value Ref Range Status   SARS Coronavirus 2 by RT PCR NEGATIVE NEGATIVE Final    Comment: (NOTE) SARS-CoV-2 target nucleic acids are NOT DETECTED.  The SARS-CoV-2 RNA is generally detectable in upper respiratory specimens during the acute phase of infection. The lowest concentration of SARS-CoV-2 viral copies this assay can detect is 138 copies/mL. A negative result does not preclude SARS-Cov-2 infection and should not be used as the sole basis for treatment or other patient management decisions. A negative result may occur with  improper specimen collection/handling, submission of specimen other than nasopharyngeal swab, presence of viral mutation(s) within the areas targeted by this assay, and inadequate number of viral copies(<138 copies/mL). A negative result must be combined with clinical observations, patient history, and epidemiological information. The expected result is Negative.  Fact Sheet for Patients:  BloggerCourse.com  Fact Sheet for Healthcare Providers:  [employer]  This test is no t yet approved or cleared by the [place] FDA and  has been authorized for detection and/or diagnosis of SARS-CoV-2 by FDA under an Emergency Use Authorization (EUA). This EUA will remain  in effect (meaning this test can be used) for the duration of the COVID-19 declaration under Section 564(b)(1) of the Act, 21 U.S.C.section 360bbb-3(b)(1), unless the authorization is terminated  or revoked sooner.       Influenza A by PCR NEGATIVE NEGATIVE Final   Influenza B by PCR NEGATIVE NEGATIVE Final    Comment: (NOTE) The Xpert Xpress SARS-CoV-2/FLU/RSV plus assay is intended as an aid in the diagnosis of influenza from Nasopharyngeal swab specimens and should not be used as a sole basis for treatment. Nasal washings and aspirates are unacceptable for Xpert Xpress SARS-CoV-2/FLU/RSV testing.  Fact Sheet for  Patients: BloggerCourse.com  Fact Sheet for Healthcare Providers: [employer]  This test is not yet approved or cleared by the [place] FDA and has been authorized for detection and/or diagnosis of SARS-CoV-2 by FDA under an Emergency Use Authorization (EUA). This EUA will remain in effect (meaning this test can be used) for the duration of the COVID-19 declaration under Section 564(b)(1) of the Act, 21 U.S.C. section 360bbb-3(b)(1), unless the authorization is terminated or revoked.  Performed at [facility], [address] [place], [place] [address]      Signed:  Kashon Kraynak  [facility] 04/11/2021, 12:26 PM

## 2021-04-11 NOTE — Progress Notes (Signed)
Upon bedside report with night RN, patient not in restraints at this time. Night RN reports that the patient hasn't needed them and therefore removed the restraints. This RN asked Night RN that the appropriate documentation be completed and the restraints discontinued per restraint policy since they are no longer on the patient.

## 2021-04-11 NOTE — Progress Notes (Signed)
Neurology Progress Note   S:// Subjectively doing much better per family.  Overnight EEG improved from previous days-continues to show LPD plus from left hemisphere maximal in the left frontal region but is better than the prior night study.   O:// Current vital signs: BP 135/90 (BP Location: Left Arm)    Pulse 75    Temp 98 F (36.7 C) (Oral)    Resp 14    Ht _0  (1.626 m)    Wt 51 kg    SpO2 100%    BMI 19.31 kg/m  Vital signs in last 24 hours: Temp:  [97.6 F (36.4 C)-98.7 F (37.1 C)] 98 F (36.7 C) (12/14 1120) Pulse Rate:  [67-80] 75 (12/14 1120) Resp:  [14-19] 14 (12/14 1120) BP: (125-154)/(90-104) 135/90 (12/14 1120) SpO2:  [100 %] 100 % (12/14 1120)  General: Awake alert in no distress HEENT: Normocephalic/atraumatic CVs: Regular rhythm Abdomen nondistended nontender Respiratory: Breathing well saturating normally on room air Extremities warm well perfused Neurological exam Awake alert oriented to self, month, year-that she is in a hospital, today she was able to tell me she is at [place] intact with some mild perseveration. Repetition intact Fluency intact Cranial nerves II to XII intact Motor examination with no focal motor deficits Sensation intact No dysmetria   Medications  Current Facility-Administered Medications:    acetaminophen (TYLENOL) tablet 650 mg, 650 mg, Oral, Q6H PRN **OR** acetaminophen (TYLENOL) suppository 650 mg, 650 mg, Rectal, Q6H PRN, Smith, Rondell A, MD   albuterol (PROVENTIL) (2.5 MG/3ML) 0.083% nebulizer solution 2.5 mg, 2.5 mg, Nebulization, Q6H PRN, Tamala Julian, Rondell A, MD   chlorhexidine gluconate (MEDLINE KIT) (PERIDEX) 0.12 % solution 15 mL, 15 mL, Mouth Rinse, BID, Smith, Rondell A, MD, 15 mL at 04/11/21 0843   enoxaparin (LOVENOX) injection 40 mg, 40 mg, Subcutaneous, Q24H, Smith, Rondell A, MD, 40 mg at 83/29/19 1660   folic acid (FOLVITE) tablet 1 mg, 1 mg, Oral, Daily, Tamala Julian, Rondell A, MD, 1 mg at 04/11/21  0842   hydrALAZINE (APRESOLINE) injection 10 mg, 10 mg, Intravenous, Q4H PRN, Tamala Julian, Rondell A, MD   levETIRAcetam (KEPPRA) IVPB 1500 mg/ 100 mL premix, 1,500 mg, Intravenous, Q12H, Amie Portland, MD, Last Rate: 400 mL/hr at 04/11/21 0842, 1,500 mg at 04/11/21 0842   LORazepam (ATIVAN) injection 1-2 mg, 1-2 mg, Intravenous, Q2H PRN, Tamala Julian, Rondell A, MD, 2 mg at 04/09/21 0921   LORazepam (ATIVAN) tablet 1-4 mg, 1-4 mg, Oral, Q1H PRN **OR** LORazepam (ATIVAN) injection 1-4 mg, 1-4 mg, Intravenous, Q1H PRN, Tamala Julian, Rondell A, MD, 2 mg at 04/10/21 2223   MEDLINE mouth rinse, 15 mL, Mouth Rinse, 10 times per day, Fuller Plan A, MD, 15 mL at 04/11/21 6004   multivitamin with minerals tablet 1 tablet, 1 tablet, Oral, Daily, Tamala Julian, Rondell A, MD, 1 tablet at 04/11/21 0842   nicotine (NICODERM CQ - dosed in mg/24 hours) patch 14 mg, 14 mg, Transdermal, Daily, Bonnielee Haff, MD, 14 mg at 04/11/21 0842   ondansetron (ZOFRAN) injection 4 mg, 4 mg, Intravenous, Q6H PRN, Bonnielee Haff, MD, 4 mg at 04/09/21 1317   pantoprazole (PROTONIX) injection 40 mg, 40 mg, Intravenous, Q24H, Smith, Rondell A, MD, 40 mg at 04/10/21 1715   potassium chloride (KLOR-CON) packet 40 mEq, 40 mEq, Oral, Q2H, Dahal, Binaya, MD, 40 mEq at 04/11/21 0842   sodium chloride flush (NS) 0.9 % injection 3 mL, 3 mL, Intravenous, Q12H, Smith, Rondell A, MD, 3 mL at 04/11/21 [phone]  thiamine tablet 100 mg, 100 mg, Oral, Daily, 100 mg at 04/11/21 0842 **OR** thiamine (B-1) injection 100 mg, 100 mg, Intravenous, Daily, Smith, Rondell A, MD, 100 mg at 04/09/21 1020 Labs CBC    Component Value Date/Time   WBC 3.0 (L) 04/09/2021 0529   RBC 4.59 04/09/2021 0529   HGB 13.3 04/09/2021 0529   HGB 13.6 05/10/2020 1037   HCT 40.3 04/09/2021 0529   PLT 230 04/09/2021 0529   PLT 234 05/10/2020 1037   MCV 87.8 04/09/2021 0529   MCH 29.0 04/09/2021 0529   MCHC 33.0 04/09/2021 0529   RDW 13.5 04/09/2021 0529   LYMPHSABS 0.6 (L) 04/08/2021 1206    MONOABS 0.5 04/08/2021 1206   EOSABS 0.0 04/08/2021 1206   BASOSABS 0.0 04/08/2021 1206    CMP     Component Value Date/Time   NA 133 (L) 04/11/2021 0333   K 3.1 (L) 04/11/2021 0333   CL 100 04/11/2021 0333   CO2 24 04/11/2021 0333   GLUCOSE 81 04/11/2021 0333   BUN 6 04/11/2021 0333   CREATININE 0.50 04/11/2021 0333   CREATININE 0.78 05/10/2020 1037   CREATININE 0.76 12/17/2013 1653   CALCIUM 8.7 (L) 04/11/2021 0333   PROT 8.3 (H) 04/08/2021 1206   ALBUMIN 4.4 04/08/2021 1206   AST 32 04/08/2021 1206   AST 26 05/10/2020 1037   ALT 18 04/08/2021 1206   ALT 14 05/10/2020 1037   ALKPHOS 69 04/08/2021 1206   BILITOT 0.6 04/08/2021 1206   BILITOT 0.6 05/10/2020 1037   GFRNONAA >60 04/11/2021 0333   GFRNONAA >60 05/10/2020 1037   GFRNONAA >89 12/17/2013 1653   GFRAA >60 10/20/2019 1028   GFRAA >89 12/17/2013 1653    Imaging I have reviewed images in epic and the results pertinent to this consultation are: MR brain DWI negative.  Other sequences not performed. Prior MRI few days ago with T2 hyperintensity and cluster of serpiginous contrast enhancement in the left frontal pole, with CT angiography not very consistent with an AVM. Recommend repeating MRI brain with without contrast. Today she was taken for MRI-did not stay still for the whole study Noncontrasted MRI study was completed Low-level restricted diffusion, increased T2 signal and increased flair signal within the left medial thalamus, left cingulate gyrus and anterior left frontal lobe along with left insula-most likely postictal rather than ischemic infarctions.  Assessment: 55 year old past history of hypertension, smoker, recent seizure where she came in with speech arrest in setting of spell and altered mental status.  Recently admitted to 10/16/2020 with a witnessed generalized tonic-clonic seizure-seizure work-up including CT, EEG was unremarkable. MRI with and without contrast at that time showed a small area  of T2 hyperintensity in cluster of serpiginous contrast-enhancement at the left frontal pole with?  AVM. She was discharged with Keppra 500 twice daily and was compliant with her medication per her husband. Day prior to presentation this time, around 10 AM she had speech arrest, staring off, becoming nonverbal and not following commands.  CT, CTA head and neck with no evidence of AVM.  CT perfusion negative as well.  MRI-stroke protocol with DWI only was done which was negative for an acute stroke. MRI with and without contrast was attempted but she was unable to lay still-there is evidence of low-level restricted diffusion and increased T2 signal antlers flair signal in the left medial thalamus, left cingulate gyrus and anterior left frontal lobe along with left insula-most likely postictal changes. She was given additional Keppra and maintenance  dose of Keppra was increased to 1 g twice daily. Currently on LTM.  No seizures overnight-does show PLEDs.  Exam and EEG both are improving over the past couple of days with exam nearly back to normal baseline today according to the husband who is bedside.  Recommendations: Continue Keppra 1500 twice daily Discontinue LTM MRI findings likely secondary to the postictal changes.  Will take a few days to weeks to normalize. No suspicion for underlying infection to prompt LP. Maintain seizure precautions-detailed below PT OT Outpatient neurology follow-up Plan relayed to Dr. Pietro Cassis over secure chat  -- Amie Portland, MD Neurologist [facility] Pager: [phone]   [place] Per [facility] statutes, patients with seizures are not allowed to drive until they have been seizure-free for six months.   Use caution when using heavy equipment or power tools. Avoid working on ladders or at heights. Take showers instead of baths. Ensure the water temperature is not too high on the home water heater. Do not go swimming alone. Do  not lock yourself in a room alone (i.e. bathroom). When caring for infants or small children, sit down when holding, feeding, or changing them to minimize risk of injury to the child in the event you have a seizure. Maintain good sleep hygiene. Avoid alcohol.    If patient has another seizure, call 911 and bring them back to the ED if: A.  The seizure lasts longer than 5 minutes.      B.  The patient doesn't wake shortly after the seizure or has new problems such as difficulty seeing, speaking or moving following the seizure C.  The patient was injured during the seizure D.  The patient has a temperature over 102 F (39C) E.  The patient vomited during the seizure and now is having trouble breathing

## 2021-04-11 NOTE — TOC CAGE-AID Note (Signed)
Transition of Care [facility]) - CAGE-AID Screening   Patient Details  Name: Martha Schneider MRN: [id number] Date of Birth: October 08, 1965  Transition of Care [facility]) CM/SW Contact:    Kermit Balo, RN Phone Number: 04/11/2021, 1:51 PM   Clinical Narrative: CM provided pt with resources for inpatient and outpatient alcohol counseling.   CAGE-AID Screening:    Have You Ever Felt You Ought to Cut Down on Your Drinking or Drug Use?: Yes Have People Annoyed You By Critizing Your Drinking Or Drug Use?: No Have You Felt Bad Or Guilty About Your Drinking Or Drug Use?: Yes Have You Ever Had a Drink or Used Drugs First Thing In The Morning to Steady Your Nerves or to Get Rid of a Hangover?: No CAGE-AID Score: 2  Substance Abuse Education Offered: Yes  Substance abuse interventions: Patient Counseling, Transport planner

## 2021-04-18 DIAGNOSIS — R569 Unspecified convulsions: Secondary | ICD-10-CM | POA: Diagnosis not present

## 2022-04-11 ENCOUNTER — Emergency Department (HOSPITAL_COMMUNITY)
Admission: EM | Admit: 2022-04-11 | Discharge: 2022-04-12 | Disposition: A | Discharge status: Home / Self Care | Payer: BLUE CROSS/BLUE SHIELD | Attending: Emergency Medicine | Admitting: Emergency Medicine

## 2022-04-11 ENCOUNTER — Emergency Department (HOSPITAL_COMMUNITY): Payer: BLUE CROSS/BLUE SHIELD

## 2022-04-11 ENCOUNTER — Other Ambulatory Visit: Payer: Self-pay

## 2022-04-11 DIAGNOSIS — G40909 Epilepsy, unspecified, not intractable, without status epilepticus: Secondary | ICD-10-CM | POA: Insufficient documentation

## 2022-04-11 DIAGNOSIS — Z7982 Long term (current) use of aspirin: Secondary | ICD-10-CM | POA: Diagnosis not present

## 2022-04-11 DIAGNOSIS — I10 Essential (primary) hypertension: Secondary | ICD-10-CM | POA: Insufficient documentation

## 2022-04-11 DIAGNOSIS — R569 Unspecified convulsions: Secondary | ICD-10-CM

## 2022-04-11 DIAGNOSIS — Z79899 Other long term (current) drug therapy: Secondary | ICD-10-CM | POA: Diagnosis not present

## 2022-04-11 DIAGNOSIS — R519 Headache, unspecified: Secondary | ICD-10-CM

## 2022-04-11 LAB — CBC WITH DIFFERENTIAL/PLATELET
Abs Immature Granulocytes: 0.03 10*3/uL (ref 0.00–0.07)
Basophils Absolute: 0 10*3/uL (ref 0.0–0.1)
Basophils Relative: 0 %
Eosinophils Absolute: 0 10*3/uL (ref 0.0–0.5)
Eosinophils Relative: 0 %
HCT: 38.3 % (ref 36.0–46.0)
Hemoglobin: 12.7 g/dL (ref 12.0–15.0)
Immature Granulocytes: 1 %
Lymphocytes Relative: 11 %
Lymphs Abs: 0.6 10*3/uL — ABNORMAL LOW (ref 0.7–4.0)
MCH: 29.5 pg (ref 26.0–34.0)
MCHC: 33.2 g/dL (ref 30.0–36.0)
MCV: 89.1 fL (ref 80.0–100.0)
Monocytes Absolute: 0.5 10*3/uL (ref 0.1–1.0)
Monocytes Relative: 10 %
Neutro Abs: 4.1 10*3/uL (ref 1.7–7.7)
Neutrophils Relative %: 78 %
Platelets: 247 10*3/uL (ref 150–400)
RBC: 4.3 MIL/uL (ref 3.87–5.11)
RDW: 14.5 % (ref 11.5–15.5)
WBC: 5.2 10*3/uL (ref 4.0–10.5)
nRBC: 0 % (ref 0.0–0.2)

## 2022-04-11 LAB — BASIC METABOLIC PANEL
Anion gap: 12 (ref 5–15)
BUN: 10 mg/dL (ref 6–20)
CO2: 25 mmol/L (ref 22–32)
Calcium: 9.1 mg/dL (ref 8.9–10.3)
Chloride: 101 mmol/L (ref 98–111)
Creatinine, Ser: 0.7 mg/dL (ref 0.44–1.00)
GFR, Estimated: >60 mL/min (ref >60)
Glucose, Bld: 87 mg/dL (ref 70–99)
Potassium: 3.9 mmol/L (ref 3.5–5.1)
Sodium: 138 mmol/L (ref 135–145)

## 2022-04-11 LAB — CBG MONITORING, ED: Glucose-Capillary: 115 mg/dL — ABNORMAL HIGH (ref 70–99)

## 2022-04-11 MED ORDER — METOCLOPRAMIDE HCL 5 MG/ML IJ SOLN
10.0000 mg | Freq: Once | INTRAMUSCULAR | Status: AC
Start: 2022-04-11 — End: 2022-04-11
  Administered 2022-04-11: 10 mg via INTRAVENOUS
  Filled 2022-04-11: qty 2

## 2022-04-11 MED ORDER — MORPHINE SULFATE (PF) 4 MG/ML IV SOLN
4.0000 mg | Freq: Once | INTRAVENOUS | Status: AC
  Administered 2022-04-11: 4 mg via INTRAVENOUS
  Filled 2022-04-11: qty 1

## 2022-04-11 MED ORDER — KETOROLAC TROMETHAMINE 15 MG/ML IJ SOLN
15.0000 mg | Freq: Once | INTRAMUSCULAR | Status: AC
  Administered 2022-04-11: 15 mg via INTRAVENOUS
  Filled 2022-04-11: qty 1

## 2022-04-11 MED ORDER — LEVETIRACETAM 500 MG PO TABS
1500.0000 mg | ORAL_TABLET | Freq: Once | ORAL | Status: AC
  Administered 2022-04-12: 1500 mg via ORAL
  Filled 2022-04-11: qty 3

## 2022-04-11 MED ORDER — LEVETIRACETAM IN NACL 500 MG/100ML IV SOLN
500.0000 mg | Freq: Once | INTRAVENOUS | Status: AC
  Administered 2022-04-11: 500 mg via INTRAVENOUS
  Filled 2022-04-11: qty 100

## 2022-04-11 MED ORDER — ACETAMINOPHEN 500 MG PO TABS
1000.0000 mg | ORAL_TABLET | Freq: Once | ORAL | Status: AC
  Administered 2022-04-11: 1000 mg via ORAL
  Filled 2022-04-11: qty 2

## 2022-04-11 NOTE — ED Provider Notes (Signed)
[facility] EMERGENCY DEPARTMENT Provider Note   CSN: [id number] Arrival date & time: 04/11/22  1741     History  Chief Complaint  Patient presents with   Seizures    Lunden Stieber is a 56 y.o. female.  Patient is a 56 year old female with a history of hypertension, sarcoidosis, depression and seizure disorder on Keppra who is presenting today by EMS after family found her lying on the floor when they got home.  She apparently had gotten home an hour before from work and unclear how long she was there.  They did not notice any seizure-like activity when they arrived but she was very somnolent.  They called 911 and when EMS arrived they did report patient was slightly agitated and postictal and they gave her 5 mg of IM Versed.  Her husband who is at the scene reported that she is compliant with her Keppra which is 1500 mg twice daily.  There was no report of any recent illness.  Unclear when patient's last seizure was.  Currently upon arrival here patient does not have family present and she is somnolent and not answering questions.  The history is provided by the EMS personnel and a relative. The history is limited by the condition of the patient.  Seizures      Home Medications Prior to Admission medications   Medication Sig Start Date End Date Taking? Authorizing Provider  amLODipine (NORVASC) 10 MG tablet Take 10 mg by mouth daily. 01/20/21   [provider]  aspirin 325 MG tablet Take 325 mg by mouth daily.    [provider]  levETIRAcetam (KEPPRA) 500 MG tablet Take 3 tablets (1,500 mg total) by mouth 2 (two) times daily. 04/11/21 07/10/21  Dahal, Melina Schools, MD  nicotine (NICODERM CQ - DOSED IN MG/24 HOURS) 14 mg/24hr patch Place 1 patch (14 mg total) onto the skin daily as needed (smoking cessation). 04/04/21   Leatha Gilding, MD  sertraline (ZOLOFT) 100 MG tablet Take 100 mg by mouth daily. 12/16/19   [provider]      Allergies     Patient has no known allergies.    Review of Systems   Review of Systems  Neurological:  Positive for seizures.    Physical Exam Updated Vital Signs BP 124/88   Pulse 90   Temp 99 F (37.2 C) (Oral)   Resp 16   Ht 5\' 4"  (1.626 m)   Wt 59 kg   SpO2 98%   BMI 22.31 kg/m  Physical Exam Vitals and nursing note reviewed.  Constitutional:      General: She is not in acute distress.    Appearance: She is well-developed.     Comments: Somnolent but does respond to physical stimuli  HENT:     Head: Normocephalic and atraumatic.  Eyes:     Pupils: Pupils are equal, round, and reactive to light.  Cardiovascular:     Rate and Rhythm: Normal rate and regular rhythm.     Heart sounds: Normal heart sounds. No murmur heard.    No friction rub.  Pulmonary:     Effort: Pulmonary effort is normal.     Breath sounds: Normal breath sounds. No wheezing or rales.  Abdominal:     General: Bowel sounds are normal. There is no distension.     Palpations: Abdomen is soft.     Tenderness: There is no abdominal tenderness. There is no guarding or rebound.  Musculoskeletal:  General: No tenderness. Normal range of motion.     Comments: No edema  Skin:    General: Skin is warm and dry.     Findings: No rash.  Neurological:     Mental Status: She is alert.     Comments: Noted to move upper and lower extremities.  No notable facial droop.  Patient currently is somnolent but does respond to tactile stimuli     ED Results / Procedures / Treatments   Labs (all labs ordered are listed, but only abnormal results are displayed) Labs Reviewed  CBC WITH DIFFERENTIAL/PLATELET - Abnormal; Notable for the following components:      Result Value   Lymphs Abs 0.6 (*)    All other components within normal limits  CBG MONITORING, ED - Abnormal; Notable for the following components:   Glucose-Capillary 115 (*)    All other components within normal limits  BASIC METABOLIC PANEL    EKG EKG  Interpretation  Date/Time:  Thursday April 11 2022 18:04:56 EST Ventricular Rate:  95 PR Interval:  193 QRS Duration: 91 QT Interval:  388 QTC Calculation: 488 R Axis:   -58 Text Interpretation: Sinus rhythm Inferior infarct, old Anterior infarct, old No significant change since last tracing Confirmed by Gwyneth Sprout ([id number]) on 04/11/2022 7:30:14 PM  Radiology CT Head Wo Contrast  Result Date: 04/11/2022 CLINICAL DATA:  Seizure EXAM: CT HEAD WITHOUT CONTRAST TECHNIQUE: Contiguous axial images were obtained from the base of the skull through the vertex without intravenous contrast. RADIATION DOSE REDUCTION: This exam was performed according to the departmental dose-optimization program which includes automated exposure control, adjustment of the mA and/or kV according to patient size and/or use of iterative reconstruction technique. COMPARISON:  None Available. FINDINGS: Brain: There is no mass, hemorrhage or extra-axial collection. The size and configuration of the ventricles and extra-axial CSF spaces are normal. Left frontal white matter hypoattenuation, possibly old infarct. Vascular: No abnormal hyperdensity of the major intracranial arteries or dural venous sinuses. No intracranial atherosclerosis. Skull: The visualized skull base, calvarium and extracranial soft tissues are normal. Sinuses/Orbits: No fluid levels or advanced mucosal thickening of the visualized paranasal sinuses. No mastoid or middle ear effusion. The orbits are normal. IMPRESSION: 1. No acute intracranial abnormality. 2. Left frontal white matter hypoattenuation, possibly old infarct. Electronically Signed   By: Deatra Robinson M.D.   On: 04/11/2022 23:16    Procedures Procedures    Medications Ordered in ED Medications  levETIRAcetam (KEPPRA) tablet 1,500 mg (has no administration in time range)  levETIRAcetam (KEPPRA) IVPB 500 mg/100 mL premix (0 mg Intravenous Stopped 04/11/22 1907)  acetaminophen (TYLENOL)  tablet 1,000 mg (1,000 mg Oral Given 04/11/22 2026)  ketorolac (TORADOL) 15 MG/ML injection 15 mg (15 mg Intravenous Given 04/11/22 2239)  morphine (PF) 4 MG/ML injection 4 mg (4 mg Intravenous Given 04/11/22 2324)  metoCLOPramide (REGLAN) injection 10 mg (10 mg Intravenous Given 04/11/22 2324)    ED Course/ Medical Decision Making/ A&P                           Medical Decision Making Amount and/or Complexity of Data Reviewed Independent Historian: spouse Labs: ordered. Decision-making details documented in ED Course. Radiology: ordered and independent interpretation performed. Decision-making details documented in ED Course. ECG/medicine tests: ordered and independent interpretation performed. Decision-making details documented in ED Course.  Risk OTC drugs. Prescription drug management.   Pt with multiple medical problems and comorbidities and presenting today  with a complaint that caries a high risk for morbidity and mortality.  Here today due to altered mental status.  Patient does have history of seizures and was not witnessed to have a seizure but EMS reported postictal agitation on their evaluation and patient received 5 of Versed prior to arrival.  Patient is somnolent here however concern it is related to the recent medication administration.  Vital signs are normal.  Patient does not have evidence of significant trauma at this time.  She was reportedly compliant with her Keppra but will give a 500 mg bolus.  Will monitor the patient to ensure her mental status improves.  Will talk with family to ensure there is no concern for new infection, cardiac or respiratory issues. 11:43 PM I independently interpreted patient's labs and EKG and CBC, BMP are at baseline.  EKG without acute findings. I have independently visualized and interpreted pt's images today. Head Ct neg.  On re-eval pt is now awake but sleepy.  Husband at bedside.  Pt now c/o of severe headache despite tylenol and  toradol.  Will try morphine and reglan.  If pain controlled and pt back to baseline will plan d/c home and f/u with neurology next month as planned.          Final Clinical Impression(s) / ED Diagnoses Final diagnoses:  None    Rx / DC Orders ED Discharge Orders     None         Gwyneth Sprout, MD 04/11/22 [phone]

## 2022-04-11 NOTE — ED Notes (Signed)
Pt is c/o pain in her head at this time and crying due to the pain. ED provider sent a message reference same

## 2022-04-11 NOTE — ED Notes (Signed)
Pt transported to ct at this time 

## 2022-04-11 NOTE — ED Notes (Signed)
Received verbal report from Isaac E RN at this time °

## 2022-04-11 NOTE — ED Triage Notes (Signed)
Pt transported from home by [facility] for reports of seizure activity, per EMS dysconjugate gaze, +urinary incontinence. Son found pt on the floor, seizure hx. Keppra, pt is compliant per husband.  Pt received Versed 5mg  IM. Responsive to painful stimuli only.  No obvious trauma.

## 2022-04-11 NOTE — ED Notes (Signed)
ED provider at this time

## 2022-04-12 NOTE — ED Notes (Signed)
Pt spouse is asking if pt can eat and if the pt will be d/c home. Sent Dr. Wilkie Aye message reference same. Awaiting response at this time

## 2022-04-12 NOTE — ED Notes (Signed)
Dr. Horton at bedside at this time.  

## 2022-04-12 NOTE — ED Provider Notes (Signed)
Patient signed out pending reassessment.  In brief presented with likely seizure.  Had headache.  This was being treated.    1:00 AM Patient reports improvement.  She is requesting discharge.  She is at her baseline.  Instructed to not drive and follow-up with her neurologist. Physical Exam  BP 106/80   Pulse 84   Temp 99.2 F (37.3 C) (Oral)   Resp 11   Ht 1.626 m (5\' 4" )   Wt 59 kg   SpO2 99%   BMI 22.31 kg/m   Physical Exam Awake, alert, no acute distress Procedures  Procedures  ED Course / MDM    Medical Decision Making Amount and/or Complexity of Data Reviewed Labs: ordered. Radiology: ordered.  Risk OTC drugs. Prescription drug management.   Problem List Items Addressed This Visit       Other   Seizure (HCC) - Primary   Other Visit Diagnoses     Nonintractable headache, unspecified chronicity pattern, unspecified headache type       Relevant Medications   levETIRAcetam (KEPPRA) IVPB 500 mg/100 mL premix (Completed)   acetaminophen (TYLENOL) tablet 1,000 mg (Completed)   ketorolac (TORADOL) 15 MG/ML injection 15 mg (Completed)   morphine (PF) 4 MG/ML injection 4 mg (Completed)   levETIRAcetam (KEPPRA) tablet 1,500 mg (Completed)             Rayleigh Gillyard, , MD 04/12/22 0101

## 2022-04-12 NOTE — Discharge Instructions (Signed)
You were seen today for possible seizure and ongoing headache.  Make sure that you are taking your medications as prescribed.  Follow-up with your neurologist.  You should not drive until cleared by your neurologist.

## 2022-04-16 ENCOUNTER — Encounter (HOSPITAL_COMMUNITY): Payer: Self-pay | Admitting: Emergency Medicine

## 2022-04-16 ENCOUNTER — Emergency Department (HOSPITAL_COMMUNITY): Payer: BLUE CROSS/BLUE SHIELD

## 2022-04-16 ENCOUNTER — Observation Stay (HOSPITAL_COMMUNITY)
Admission: EM | Admit: 2022-04-16 | Discharge: 2022-04-16 | Disposition: A | Discharge status: Home / Self Care | Payer: BLUE CROSS/BLUE SHIELD | Attending: Internal Medicine | Admitting: Internal Medicine

## 2022-04-16 ENCOUNTER — Other Ambulatory Visit: Payer: Self-pay

## 2022-04-16 DIAGNOSIS — I10 Essential (primary) hypertension: Secondary | ICD-10-CM | POA: Diagnosis not present

## 2022-04-16 DIAGNOSIS — F1010 Alcohol abuse, uncomplicated: Secondary | ICD-10-CM | POA: Diagnosis present

## 2022-04-16 DIAGNOSIS — J029 Acute pharyngitis, unspecified: Secondary | ICD-10-CM | POA: Diagnosis not present

## 2022-04-16 DIAGNOSIS — R7401 Elevation of levels of liver transaminase levels: Secondary | ICD-10-CM | POA: Diagnosis present

## 2022-04-16 DIAGNOSIS — F32A Depression, unspecified: Secondary | ICD-10-CM | POA: Diagnosis present

## 2022-04-16 DIAGNOSIS — F17210 Nicotine dependence, cigarettes, uncomplicated: Secondary | ICD-10-CM | POA: Diagnosis not present

## 2022-04-16 DIAGNOSIS — Z79899 Other long term (current) drug therapy: Secondary | ICD-10-CM | POA: Diagnosis not present

## 2022-04-16 DIAGNOSIS — Z7982 Long term (current) use of aspirin: Secondary | ICD-10-CM | POA: Diagnosis not present

## 2022-04-16 DIAGNOSIS — G40909 Epilepsy, unspecified, not intractable, without status epilepticus: Principal | ICD-10-CM

## 2022-04-16 DIAGNOSIS — R569 Unspecified convulsions: Secondary | ICD-10-CM

## 2022-04-16 DIAGNOSIS — Z720 Tobacco use: Secondary | ICD-10-CM | POA: Diagnosis present

## 2022-04-16 HISTORY — DX: Unspecified convulsions: R56.9

## 2022-04-16 LAB — GROUP A STREP BY PCR: Group A Strep by PCR: NOT DETECTED

## 2022-04-16 LAB — CBC WITH DIFFERENTIAL/PLATELET
Abs Immature Granulocytes: 0.01 10*3/uL (ref 0.00–0.07)
Basophils Absolute: 0 10*3/uL (ref 0.0–0.1)
Basophils Relative: 0 %
Eosinophils Absolute: 0.1 10*3/uL (ref 0.0–0.5)
Eosinophils Relative: 1 %
HCT: 44.3 % (ref 36.0–46.0)
Hemoglobin: 14.1 g/dL (ref 12.0–15.0)
Immature Granulocytes: 0 %
Lymphocytes Relative: 30 %
Lymphs Abs: 1.4 10*3/uL (ref 0.7–4.0)
MCH: 29.3 pg (ref 26.0–34.0)
MCHC: 31.8 g/dL (ref 30.0–36.0)
MCV: 91.9 fL (ref 80.0–100.0)
Monocytes Absolute: 0.7 10*3/uL (ref 0.1–1.0)
Monocytes Relative: 14 %
Neutro Abs: 2.7 10*3/uL (ref 1.7–7.7)
Neutrophils Relative %: 55 %
Platelets: 200 10*3/uL (ref 150–400)
RBC: 4.82 MIL/uL (ref 3.87–5.11)
RDW: 14.2 % (ref 11.5–15.5)
WBC: 4.9 10*3/uL (ref 4.0–10.5)
nRBC: 0 % (ref 0.0–0.2)

## 2022-04-16 LAB — COMPREHENSIVE METABOLIC PANEL
ALT: 28 U/L (ref 0–44)
AST: 68 U/L — ABNORMAL HIGH (ref 15–41)
Albumin: 4.5 g/dL (ref 3.5–5.0)
Alkaline Phosphatase: 68 U/L (ref 38–126)
Anion gap: 17 — ABNORMAL HIGH (ref 5–15)
BUN: 9 mg/dL (ref 6–20)
CO2: 18 mmol/L — ABNORMAL LOW (ref 22–32)
Calcium: 9.4 mg/dL (ref 8.9–10.3)
Chloride: 102 mmol/L (ref 98–111)
Creatinine, Ser: 0.76 mg/dL (ref 0.44–1.00)
GFR, Estimated: >60 mL/min (ref >60)
Glucose, Bld: 128 mg/dL — ABNORMAL HIGH (ref 70–99)
Potassium: 4.1 mmol/L (ref 3.5–5.1)
Sodium: 137 mmol/L (ref 135–145)
Total Bilirubin: 0.7 mg/dL (ref 0.3–1.2)
Total Protein: 8.2 g/dL — ABNORMAL HIGH (ref 6.5–8.1)

## 2022-04-16 LAB — MAGNESIUM: Magnesium: 1.6 mg/dL — ABNORMAL LOW (ref 1.7–2.4)

## 2022-04-16 LAB — HIV ANTIBODY (ROUTINE TESTING W REFLEX): HIV Screen 4th Generation wRfx: NONREACTIVE

## 2022-04-16 LAB — ETHANOL: Alcohol, Ethyl (B): <10 mg/dL (ref <10)

## 2022-04-16 MED ORDER — LACOSAMIDE 50 MG PO TABS: 100.0000 mg | ORAL_TABLET | Freq: Two times a day (BID) | ORAL | Status: DC

## 2022-04-16 MED ORDER — ACETAMINOPHEN 650 MG RE SUPP: 650.0000 mg | Freq: Four times a day (QID) | RECTAL | Status: DC | PRN

## 2022-04-16 MED ORDER — LORAZEPAM 2 MG/ML IJ SOLN: 0.0000 mg | Freq: Four times a day (QID) | INTRAMUSCULAR | Status: DC

## 2022-04-16 MED ORDER — LORAZEPAM 2 MG/ML IJ SOLN
INTRAMUSCULAR | Status: AC
  Filled 2022-04-16: qty 1

## 2022-04-16 MED ORDER — ENOXAPARIN SODIUM 40 MG/0.4ML IJ SOSY
40.0000 mg | PREFILLED_SYRINGE | INTRAMUSCULAR | Status: DC
  Administered 2022-04-16: 40 mg via SUBCUTANEOUS
  Filled 2022-04-16: qty 0.4

## 2022-04-16 MED ORDER — DIAZEPAM 2.5 MG RE GEL: 2.5000 mg | RECTAL | 1 refills | Status: DC | PRN

## 2022-04-16 MED ORDER — THIAMINE MONONITRATE 100 MG PO TABS: 100.0000 mg | ORAL_TABLET | Freq: Every day | ORAL | Status: DC

## 2022-04-16 MED ORDER — ADULT MULTIVITAMIN W/MINERALS CH
1.0000 | ORAL_TABLET | Freq: Every day | ORAL | Status: DC
  Administered 2022-04-16: 1 via ORAL
  Filled 2022-04-16: qty 1

## 2022-04-16 MED ORDER — LORAZEPAM 2 MG/ML IJ SOLN: 1.0000 mg | INTRAMUSCULAR | Status: DC | PRN

## 2022-04-16 MED ORDER — DIAZEPAM 2.5 MG RE GEL: 2.5000 mg | RECTAL | Status: DC | PRN

## 2022-04-16 MED ORDER — AMLODIPINE BESYLATE 5 MG PO TABS: 10.0000 mg | ORAL_TABLET | Freq: Every day | ORAL | Status: DC

## 2022-04-16 MED ORDER — LORAZEPAM 2 MG/ML IJ SOLN
2.0000 mg | Freq: Once | INTRAMUSCULAR | Status: AC
  Administered 2022-04-16: 2 mg via INTRAMUSCULAR

## 2022-04-16 MED ORDER — FOLIC ACID 1 MG PO TABS
1.0000 mg | ORAL_TABLET | Freq: Every day | ORAL | Status: DC
  Administered 2022-04-16: 1 mg via ORAL
  Filled 2022-04-16: qty 1

## 2022-04-16 MED ORDER — LEVETIRACETAM 500 MG PO TABS: 1000.0000 mg | ORAL_TABLET | Freq: Two times a day (BID) | ORAL | Status: DC

## 2022-04-16 MED ORDER — MAGNESIUM SULFATE 2 GM/50ML IV SOLN
2.0000 g | Freq: Once | INTRAVENOUS | Status: AC
  Administered 2022-04-16: 2 g via INTRAVENOUS
  Filled 2022-04-16: qty 50

## 2022-04-16 MED ORDER — SERTRALINE HCL 100 MG PO TABS: 100.0000 mg | ORAL_TABLET | Freq: Every day | ORAL | Status: DC

## 2022-04-16 MED ORDER — SODIUM CHLORIDE 0.9 % IV BOLUS
1000.0000 mL | Freq: Once | INTRAVENOUS | Status: AC
  Administered 2022-04-16: 1000 mL via INTRAVENOUS

## 2022-04-16 MED ORDER — THIAMINE HCL 100 MG/ML IJ SOLN
100.0000 mg | Freq: Every day | INTRAMUSCULAR | Status: DC
  Administered 2022-04-16: 100 mg via INTRAVENOUS
  Filled 2022-04-16: qty 2

## 2022-04-16 MED ORDER — SODIUM CHLORIDE 0.9 % IV SOLN: INTRAVENOUS | Status: DC

## 2022-04-16 MED ORDER — LACOSAMIDE 100 MG PO TABS: 100.0000 mg | ORAL_TABLET | Freq: Two times a day (BID) | ORAL | 0 refills | Status: DC

## 2022-04-16 MED ORDER — LORAZEPAM 1 MG PO TABS: 1.0000 mg | ORAL_TABLET | ORAL | Status: DC | PRN

## 2022-04-16 MED ORDER — NICOTINE 14 MG/24HR TD PT24
14.0000 mg | MEDICATED_PATCH | Freq: Every day | TRANSDERMAL | Status: DC
  Administered 2022-04-16: 14 mg via TRANSDERMAL
  Filled 2022-04-16: qty 1

## 2022-04-16 MED ORDER — ALBUTEROL SULFATE (2.5 MG/3ML) 0.083% IN NEBU: 2.5000 mg | INHALATION_SOLUTION | Freq: Four times a day (QID) | RESPIRATORY_TRACT | Status: DC | PRN

## 2022-04-16 MED ORDER — PROCHLORPERAZINE EDISYLATE 10 MG/2ML IJ SOLN
5.0000 mg | Freq: Once | INTRAMUSCULAR | Status: AC
  Administered 2022-04-16: 5 mg via INTRAVENOUS
  Filled 2022-04-16: qty 2

## 2022-04-16 MED ORDER — NICOTINE 14 MG/24HR TD PT24: 14.0000 mg | MEDICATED_PATCH | Freq: Every day | TRANSDERMAL | 0 refills | Status: DC

## 2022-04-16 MED ORDER — LORAZEPAM 2 MG/ML IJ SOLN: 0.0000 mg | Freq: Two times a day (BID) | INTRAMUSCULAR | Status: DC

## 2022-04-16 MED ORDER — LACOSAMIDE 50 MG PO TABS
100.0000 mg | ORAL_TABLET | Freq: Once | ORAL | Status: AC
  Administered 2022-04-16: 100 mg via ORAL
  Filled 2022-04-16: qty 2

## 2022-04-16 MED ORDER — ACETAMINOPHEN 325 MG PO TABS: 650.0000 mg | ORAL_TABLET | Freq: Four times a day (QID) | ORAL | Status: DC | PRN

## 2022-04-16 MED ORDER — SODIUM CHLORIDE 0.9% FLUSH
3.0000 mL | Freq: Two times a day (BID) | INTRAVENOUS | Status: DC
  Administered 2022-04-16: 3 mL via INTRAVENOUS

## 2022-04-16 MED ORDER — LEVETIRACETAM IN NACL 1500 MG/100ML IV SOLN
1500.0000 mg | Freq: Once | INTRAVENOUS | Status: AC
  Administered 2022-04-16: 1500 mg via INTRAVENOUS
  Filled 2022-04-16: qty 100

## 2022-04-16 MED ORDER — LEVETIRACETAM 500 MG PO TABS
1500.0000 mg | ORAL_TABLET | Freq: Two times a day (BID) | ORAL | Status: DC
  Administered 2022-04-16: 1500 mg via ORAL
  Filled 2022-04-16: qty 3

## 2022-04-16 NOTE — ED Provider Triage Note (Signed)
  Emergency Medicine Provider Triage Evaluation Note  MRN:  [id number]  Arrival date & time: 04/16/22    Medically screening exam initiated at 1:49 AM.   CC:   Seizures   HPI:  Martha Schneider is a 56 y.o. year-old female presents to the ED with chief complaint of seizure.  Hx of seizure.  Husband reports compliance with meds.  1:50 AM Patient had seizure that lasted ~2 minutes in triage, full body tonic/clonic seizure. Appears postictal now.  Patient taken to trauma A.  History provided by husband ROS:  -As included in HPI PE:   Vitals:   04/16/22 0125  BP: (!) 138/94  Pulse: (!) 108  Resp: 18  Temp: 99.1 F (37.3 C)  SpO2: 97%    Postictal  MDM:  Based on signs and symptoms, seizure is highest on my differential. I've ordered ativan Im in triage to expedite lab/diagnostic workup.  Patient was informed that the remainder of the evaluation will be completed by another provider, this initial triage assessment does not replace that evaluation, and the importance of remaining in the ED until their evaluation is complete.    Roxy Horseman, PA-C 04/16/22 0151

## 2022-04-16 NOTE — ED Notes (Signed)
Pt found walking around in room disconnected from tele monitor and purewick. Pt unable to explain why she was up. However, staff noticed she had a BM, pt confirmed. Pt cleaned and new purewick placed.

## 2022-04-16 NOTE — ED Notes (Signed)
Neuro at bedside.

## 2022-04-16 NOTE — Consult Note (Signed)
Neurology Consultation Reason for Consult: Seizure Referring Physician: Cardama, P  CC: Seizure  History is obtained from: Family member, patient  HPI: Martha Schneider is a 56 y.o. female with a history of sarcoidosis, seizures who presents with breakthrough seizures.  She initially had seizures in December 2022 who had normal EEG and MRI at that time.    Subsequently a few days later, she was readmitted with confusion following an episode of speech arrest.  She was found to have a PLEDs plus pattern on her EEG as well as subclinical seizures.  She was treated with Keppra.  She has been compliant with Keppra 1500 twice daily since that time.  She had a breakthrough seizure last Thursday but improved and went home with plan to call her neurologist.  Subsequently last night she had a seizure and came into the emergency department where she had a repeat seizure at approximately 1:50 AM.  Since that time, she has remained very confused, but has been gradually improving.    Past Medical History:  Diagnosis Date   Depression    Hypertension    Sarcoidosis    Seizures ([place])      Family History  Problem Relation Age of Onset   Heart attack Mother    Liver cancer Father    Diabetes Mellitus II Sister    Kidney disease Sister      Social History:  reports that she has been smoking cigarettes. She has a 18.50 pack-year smoking history. She has never used smokeless tobacco. She reports current alcohol use. She reports that she does not use drugs.   Exam: Current vital signs: BP (!) 142/94   Pulse 95   Temp 99.8 F (37.7 C) (Oral)   Resp 17   SpO2 97%  Vital signs in last 24 hours: Temp:  [99.1 F (37.3 C)-99.8 F (37.7 C)] 99.8 F (37.7 C) (12/19 0617) Pulse Rate:  [90-108] 95 (12/19 0615) Resp:  [17-23] 17 (12/19 0615) BP: (125-153)/(82-98) 142/94 (12/19 0615) SpO2:  [95 %-98 %] 97 % (12/19 0615)   Physical Exam  In bed, laying on her side and resting.  Neuro: Mental  Status: Patient is somnolent but awakens easily, she is able to tell me her name, where she is, gives the month is March and the year is 2024.   Cranial Nerves: II: Visual Fields are full. Pupils are equal, round, and reactive to light.   III,IV, VI: EOMI without ptosis or diploplia.  V: Facial sensation is symmetric to temperature VII: Facial movement is symmetric.  VIII: hearing is intact to voice X: Uvula elevates symmetrically XI: Shoulder shrug is symmetric. XII: tongue is midline without atrophy or fasciculations.  Motor: Tone is normal. Bulk is normal. 5/5 strength was present in all four extremities.  Sensory: Sensation is symmetric to light touch and temperature in the arms and legs.     I have reviewed labs in epic and the results pertinent to this consultation are: Sodium 137 Calcium 9.4 Creatinine 0.76   I have reviewed the images obtained: CT head-negative  Impression: 56 year old female with a history of seizures including subclinical seizures who presents with prolonged postictal state after seizure this morning.  Given that she is still confused as well as the fact that she has had a history of initially very subtle seizures, I would favor prolonged EEG to ensure she is not continuing to have seizures.  Recommendations: 1) start Vimpat 100 mg twice daily, first dose now 2) continue home dose  of Keppra at 1500 twice daily 3) overnight EEG 4) neurology will continue to follow  Ritta Slot, MD [facility] [phone]  If 7pm- 7am, please page neurology on call as listed in AMION.

## 2022-04-16 NOTE — ED Triage Notes (Signed)
Patient spouse reported that he witnessed grand mal seizure activity at home this evening . No injury/respirations unlabored , patient unable to recall incident .

## 2022-04-16 NOTE — Discharge Summary (Signed)
Physician Discharge Summary   Patient: Martha Schneider MRN: [id number] DOB: [birth year]  Admit date:     04/16/2022  Discharge date: 04/16/22  Discharge Physician: Clydie Braun   PCP: [facility]   Recommendations at discharge:   Recommend outpatient follow-up with PCP and neurology Recommending patient stop drinking alcohol by decreasing 1 beer per week until able to quit Follow-up with PCP regarding sore throat  and hoarse voice patient was not checked for influenza, RSV, or COVID-19 while at the hospital. Follow-up electrolytes  Discharge Diagnoses: Principal Problem:   Seizure (HCC) Active Problems:   Alcohol abuse   Sore throat   Hypomagnesemia   Hypertension   Depression   Transaminitis   Tobacco abuse    Hospital Course: Martha Schneider is a 56 y.o. female with medical history significant of hypertension, sarcoidosis, chronic leukopenia, seizure disorder, depression, tobacco abuse, and alcohol abuse who presents after having a seizure at home last night.  Much of history is obtained from the patient's husband who is present with the patient at bedside.  He makes note that the patient had no prior history of seizures until last year in December when she had 2 requiring hospitalization.  She had been started on Keppra at that time and reports taking 1500 mg twice daily.  She had been seen in the emergency department 5 days ago after having a seizure at home where she had fallen on the floor and was found thereafter when family got back home.  She is in the emergency department and ultimately discharged home.  Last night around 11 PM the patient had woken up and complained of headache.  Some time thereafter she had yelled out and was found having stiff jerking movement of her upper and lower extremities.  Her husband tried to hold her down so she did not herself and brought her here to the hospital.  Patient complains of some discomfort and tenderness to  palpation across her sternum.  The patient does drink approximately a sixpack of beer most nights on average and smokes half a pack to cigarettes per day on average   In the emergency department patient was noted to have temperature 99.8 F with tachycardia, tachypnea, blood pressures elevated up to 153/90, and O2 saturation maintained on room air.  She was noted to have a seizure while in triage.  Labs noted WBC 4.9, CO2 18, anion gap 17,   AST 68, and alcohol level undetectable.  CT scan of the head did not note any acute intercranial abnormality.  Neurology had been consulted.  Patient had been given Ativan 2 mg IV, 2 L of normal saline IV fluids, Compazine, Keppra 1500 mg IV, and was started on Vimpat 100 mg.  Neurology recommended admission and overnight EEG monitoring.  Patient came to and to be back to her normal state of health.  Neurology no longer recommended admission and she has known seizure disorder.  Question possibility of infection lowering seizure threshold.  Strep throat culture for obtained but negative. Assessment and Plan:  Seizure seizure disorder Patient presents after having least 1 seizure 5 days ago and then 2 seizures in the last 24 hours.  CT scan of the brain did not note any acute abnormality. When initially diagnosed with seizures 1 year ago EEG had show lateralized periodic discharges from left hemisphere.   Patient reported compliance with taking Keppra 1500 mg twice daily as prescribed.  She does continue to drink alcohol up to 6 beers  per day on average.  Unclear if patient's alcohol abuse or some underlying infection is lowering her seizure threshold. Neurology evaluated and recommended continue Keppra 1500 mg twice daily and add on Vimpat 100 mg twice daily.  Neurology felt patient okay to be discharged home after she returned to her baseline.  Discussed with the patient regards to restriction from driving for at least 6 months until seizure-free and cleared by a  physician and other safety precautions.  Prescriptions were sent for Vimpat as well as rectal diazepam as needed.  Patient made note that due to insurance her neurologist may need to change.  Ambulatory referral to neurology had also been made just in case   Alcohol abuse Reports drinking a sixpack of beer most days.  Initial alcohol level was undetectable. Unclear if this lowered her seizure threshold.  Patient was placed on CIWA protocols while in the house.  Discussed with the patient and her husband in regards to possible plan to quit drinking to avoid possible alcohol withdrawals.   Sore throat Acute.  Patient reported having sore throat and hoarse voice recently.  Strep throat culture was obtained and negative.    Hypomagnesemia Acute.  Magnesium 1.6.  Patient was given 2 g of magnesium sulfate IV prior to leaving   Essential hypertension Blood pressure 125/86-153/90 -Continued amlodipine   Depression -Continued Zoloft   Transaminitis AST 68 and ALT 28.  The AST to ALT is greater than 2:1 is consistent with alcohol abuse.   Tobacco abuse Patient smokes half a pack cigarettes per day on average has done so for her over 30 years. -Nicotine patch offered -Continue to counsel on need of cessation of tobacco use          Consultants: Neurology Procedures performed: None Disposition: Home Diet recommendation:  Discharge Diet Orders (From admission, onward)     Start     Ordered   04/16/22 0000  Diet - low sodium heart healthy        04/16/22 1747           Cardiac diet DISCHARGE MEDICATION: Allergies as of 04/16/2022   No Known Allergies      Medication List     TAKE these medications    amLODipine 10 MG tablet Commonly known as: NORVASC Take 10 mg by mouth daily.   aspirin 325 MG tablet Take 325 mg by mouth daily.   diazepam 2.5 MG Gel Commonly known as: DIASTAT Place 2.5 mg rectally as needed for seizure.   Lacosamide 100 MG Tabs Commonly known  as: Vimpat Take 1 tablet (100 mg total) by mouth 2 (two) times daily.   levETIRAcetam 500 MG tablet Commonly known as: KEPPRA Take 3 tablets (1,500 mg total) by mouth 2 (two) times daily.   nicotine 14 mg/24hr patch Commonly known as: NICODERM CQ - dosed in mg/24 hours Place 1 patch (14 mg total) onto the skin daily. Start taking on: April 17, 2022 What changed:  when to take this reasons to take this   sertraline 100 MG tablet Commonly known as: ZOLOFT Take 100 mg by mouth daily.        Follow-up Information     [facility]. Call .   Specialty: Family Medicine Why: to schedule an appointment for close follow up Contact information: [address] [place] [id number] [phone]         [facility]. Call in 1 week(s).   Specialty: Neurology Why: Call  and verify a follow-up appointment has been made Contact information: [address] [place] [address] [phone]               Discharge Exam: Today's Vitals   04/16/22 0617 04/16/22 0700 04/16/22 1307 04/16/22 1854  BP:  135/74 132/86 135/85  Pulse:  95 93 92  Resp:  16 16 14   Temp: 99.8 F (37.7 C)  99 F (37.2 C) 98.7 F (37.1 C)  TempSrc: Oral  Oral Oral  SpO2:  97% 96% 99%  PainSc:       There is no height or weight on file to calculate BMI.   Middle-aged female currently in no acute distress able to follow commands  Normal range of motion in the upper and lower extremities with no joint deformity appreciated Cranial nerves grossly intact.  Strength 5/5 in the upper and lower extremities Alert and oriented x 3  Condition at discharge: stable  The results of significant diagnostics from this hospitalization (including imaging, microbiology, ancillary and laboratory) are listed below for reference.   Imaging Studies: CT Head Wo Contrast  Result Date: 04/16/2022 CLINICAL DATA:  Altered mental  status. EXAM: CT HEAD WITHOUT CONTRAST TECHNIQUE: Contiguous axial images were obtained from the base of the skull through the vertex without intravenous contrast. RADIATION DOSE REDUCTION: This exam was performed according to the departmental dose-optimization program which includes automated exposure control, adjustment of the mA and/or kV according to patient size and/or use of iterative reconstruction technique. COMPARISON:  04/11/2022 FINDINGS: Brain: No evidence of acute infarction, hemorrhage, hydrocephalus, extra-axial collection or mass lesion/mass effect. Unchanged left frontal lobe white matter hypoattenuation, possibly old infarct. Vascular: No hyperdense vessel or unexpected calcification. Skull: Normal. Negative for fracture or focal lesion. Sinuses/Orbits: Paranasal sinuses and mastoid air cells are clear. Other: None. IMPRESSION: 1. No acute intracranial abnormality. 2. Unchanged left frontal lobe white matter hypoattenuation, possibly old infarct. Electronically Signed   By: 04/13/2022 M.D.   On: 04/16/2022 06:05   CT Head Wo Contrast  Result Date: 04/11/2022 CLINICAL DATA:  Seizure EXAM: CT HEAD WITHOUT CONTRAST TECHNIQUE: Contiguous axial images were obtained from the base of the skull through the vertex without intravenous contrast. RADIATION DOSE REDUCTION: This exam was performed according to the departmental dose-optimization program which includes automated exposure control, adjustment of the mA and/or kV according to patient size and/or use of iterative reconstruction technique. COMPARISON:  None Available. FINDINGS: Brain: There is no mass, hemorrhage or extra-axial collection. The size and configuration of the ventricles and extra-axial CSF spaces are normal. Left frontal white matter hypoattenuation, possibly old infarct. Vascular: No abnormal hyperdensity of the major intracranial arteries or dural venous sinuses. No intracranial atherosclerosis. Skull: The visualized skull  base, calvarium and extracranial soft tissues are normal. Sinuses/Orbits: No fluid levels or advanced mucosal thickening of the visualized paranasal sinuses. No mastoid or middle ear effusion. The orbits are normal. IMPRESSION: 1. No acute intracranial abnormality. 2. Left frontal white matter hypoattenuation, possibly old infarct. Electronically Signed   By: 04/13/2022 M.D.   On: 04/11/2022 23:16    Microbiology: Results for orders placed or performed during the hospital encounter of 04/08/21  Resp Panel by RT-PCR (Flu A&B, Covid) Nasopharyngeal Swab     Status: None   Collection Time: 04/08/21  1:54 PM   Specimen: Nasopharyngeal Swab; Nasopharyngeal(NP) swabs in vial transport medium  Result Value Ref Range Status   SARS Coronavirus 2 by RT PCR NEGATIVE NEGATIVE Final  Comment: (NOTE) SARS-CoV-2 target nucleic acids are NOT DETECTED.  The SARS-CoV-2 RNA is generally detectable in upper respiratory specimens during the acute phase of infection. The lowest concentration of SARS-CoV-2 viral copies this assay can detect is 138 copies/mL. A negative result does not preclude SARS-Cov-2 infection and should not be used as the sole basis for treatment or other patient management decisions. A negative result may occur with  improper specimen collection/handling, submission of specimen other than nasopharyngeal swab, presence of viral mutation(s) within the areas targeted by this assay, and inadequate number of viral copies(<138 copies/mL). A negative result must be combined with clinical observations, patient history, and epidemiological information. The expected result is Negative.  Fact Sheet for Patients:  BloggerCourse.com  Fact Sheet for Healthcare Providers:  SeriousBroker.it  This test is no t yet approved or cleared by the [place] FDA and  has been authorized for detection and/or diagnosis of SARS-CoV-2 by FDA under an  Emergency Use Authorization (EUA). This EUA will remain  in effect (meaning this test can be used) for the duration of the COVID-19 declaration under Section 564(b)(1) of the Act, 21 U.S.C.section 360bbb-3(b)(1), unless the authorization is terminated  or revoked sooner.       Influenza A by PCR NEGATIVE NEGATIVE Final   Influenza B by PCR NEGATIVE NEGATIVE Final    Comment: (NOTE) The Xpert Xpress SARS-CoV-2/FLU/RSV plus assay is intended as an aid in the diagnosis of influenza from Nasopharyngeal swab specimens and should not be used as a sole basis for treatment. Nasal washings and aspirates are unacceptable for Xpert Xpress SARS-CoV-2/FLU/RSV testing.  Fact Sheet for Patients: BloggerCourse.com  Fact Sheet for Healthcare Providers: SeriousBroker.it  This test is not yet approved or cleared by the [place] FDA and has been authorized for detection and/or diagnosis of SARS-CoV-2 by FDA under an Emergency Use Authorization (EUA). This EUA will remain in effect (meaning this test can be used) for the duration of the COVID-19 declaration under Section 564(b)(1) of the Act, 21 U.S.C. section 360bbb-3(b)(1), unless the authorization is terminated or revoked.  Performed at [facility], [address] [place], [place] [address]     Labs: CBC: Recent Labs  Lab 04/11/22 1850 04/16/22 0159  WBC 5.2 4.9  NEUTROABS 4.1 2.7  HGB 12.7 14.1  HCT 38.3 44.3  MCV 89.1 91.9  PLT 247 200   Basic Metabolic Panel: Recent Labs  Lab 04/11/22 1850 04/16/22 0159 04/16/22 1114  NA 138 137  --   K 3.9 4.1  --   CL 101 102  --   CO2 25 18*  --   GLUCOSE 87 128*  --   BUN 10 9  --   CREATININE 0.70 0.76  --   CALCIUM 9.1 9.4  --   MG  --   --  1.6*   Liver Function Tests: Recent Labs  Lab 04/16/22 0159  AST 68*  ALT 28  ALKPHOS 68  BILITOT 0.7  PROT 8.2*  ALBUMIN 4.5   CBG: Recent Labs  Lab 04/11/22 1753   GLUCAP 115*    Discharge time spent: greater than 30 minutes.  Signed: Clydie Braun, MD [facility] 04/16/2022

## 2022-04-16 NOTE — Discharge Instructions (Addendum)
Please be advised of the need to stop drinking beer.  We do not advise stopping abruptly, but decreasing 1 beer per week until you are able to completely quit we will likely decrease the risk of withdrawal.  Neurology had added on Vimpat 100 mg to take twice daily which  has been sent to your pharmacy.  This may medication needs to be continued, but normally we only give a 1 month supply.  Therefore this medication must be continued by your primary or neurologist.  A referral has been sent to [facility] for you to follow-up within 2 weeks who is numbers provided in the discharge paperwork.  It is also recommended that she quit smoking tobacco.  [place] lower recommends that you do not drive for 6 months until seizure-free and okay by your primary care provider or neurologist.  Make sure your home is safe to help prevent injuries if seizure takes place. Keep your bathroom and bedroom doors unlocked. Keep these doors from being blocked. Take showers only. Do not take baths because of the risk of drowning during a seizure.When cooking, turn pot and pan handles toward the back of the stove.

## 2022-04-16 NOTE — Plan of Care (Addendum)
Neurology Care Plan:  Martha Schneider is a 56 y.o. female with a history of sarcoidosis, daily ETOH, seizures who presents with breakthrough seizures. She initially had seizures in December 2022 who had normal EEG and MRI at that time. Subsequently a few days later, she was readmitted with confusion following an episode of speech arrest. She was found to have a PLEDs plus pattern on her EEG as well as subclinical seizures, was then treated with Keppra 1500mg  BID, which she has been compliant with.    She had a breakthrough seizure last Thursday but improved and went home with plan to call her neurologist. Subsequently last night she had a seizure and came into the emergency department where she had a repeat seizure at approximately 1:50 AM.  Since that time, she has remained very confused and plan was to admit to hospital for LTM to r/o subclinical seizures. However, there are no beds and the pt has remained in the hallway of the ER all day and EEG tech is not able to hook her up nor is there room to place EEG machine in hall walkway. Additionally, she c/o a sore throat and c/o URI symptoms, which can lower seizure threshold.  This afternoon, we rounded again on her and found that she has greatly improved and is essentially back to baseline, now A&Ox4 and following all commands briskly. No focal deficits. Since she has had no further seizures and now improved, she can dc home and f/u with her out pt neurologist. We have updated attending, Dr Saturday with suggestion to dc home at this time. We have canceled the LTM.  Please call back if needed. We will sign off.  Ricard Faulkner Cihelka, PhDc, ARNP-C, ANVP-BC Please contact via AMION [facility]

## 2022-04-16 NOTE — H&P (Signed)
History and Physical    Patient: Martha Schneider CVU:[id number] DOB: 1966/04/04 DOA: 04/16/2022 DOS: the patient was seen and examined on 04/16/2022 PCP: [facility]  Patient coming from: Home  Chief Complaint:  Chief Complaint  Patient presents with   Seizures   HPI: Martha Schneider is a 56 y.o. female with medical history significant of hypertension, sarcoidosis, chronic leukopenia, seizure disorder, depression, tobacco abuse, and alcohol abuse who presents after having a seizure at home last night.  Much of history is obtained from the patient's husband who is present with the patient at bedside.  He makes note that the patient had no prior history of seizures until last year in December when she had 2 requiring hospitalization.  She had been started on Keppra at that time and reports taking 1500 mg twice daily.  She had been seen in the emergency department 5 days ago after having a seizure at home where she had fallen on the floor and was found thereafter when family got back home.  She is in the emergency department and ultimately discharged home.  Last night around 11 PM the patient had woken up and complained of headache.  Some time thereafter she had yelled out and was found having stiff jerking movement of her upper and lower extremities.  Her husband tried to hold her down so she did not herself and brought her here to the hospital.  Patient complains of some discomfort and tenderness to palpation across her sternum.  The patient does drink approximately a sixpack of beer most nights on average and smokes half a pack to cigarettes per day on average  In the emergency department patient was noted to have temperature 99.8 F with tachycardia, tachypnea, blood pressures elevated up to 153/90, and O2 saturation maintained on room air.  She was noted to have a seizure while in triage.  Labs noted WBC 4.9, CO2 18, anion gap 17,   AST 68, and alcohol level undetectable.  CT scan  of the head did not note any acute intercranial abnormality.  Neurology had been consulted.  Patient had been given Ativan 2 mg IV, 2 L of normal saline IV fluids, Compazine, Keppra 1500 mg IV, and was started on Vimpat 100 mg.  Neurology recommended admission and overnight EEG monitoring.  Review of Systems: As mentioned in the history of present illness. All other systems reviewed and are negative. Past Medical History:  Diagnosis Date   Depression    Hypertension    Sarcoidosis    Seizures (HCC)    No past surgical history on file. Social History:  reports that she has been smoking cigarettes. She has a 18.50 pack-year smoking history. She has never used smokeless tobacco. She reports current alcohol use. She reports that she does not use drugs.  No Known Allergies  Family History  Problem Relation Age of Onset   Heart attack Mother    Liver cancer Father    Diabetes Mellitus II Sister    Kidney disease Sister     Prior to Admission medications   Medication Sig Start Date End Date Taking? Authorizing Provider  Lacosamide (VIMPAT) 100 MG TABS Take 1 tablet (100 mg total) by mouth 2 (two) times daily. 04/16/22  Yes Cardama, Amadeo Garnet, MD  amLODipine (NORVASC) 10 MG tablet Take 10 mg by mouth daily. 01/20/21   [provider]  aspirin 325 MG tablet Take 325 mg by mouth daily.    [provider]  levETIRAcetam (KEPPRA)  500 MG tablet Take 3 tablets (1,500 mg total) by mouth 2 (two) times daily. 04/11/21 07/10/21  Dahal, Melina Schools, MD  nicotine (NICODERM CQ - DOSED IN MG/24 HOURS) 14 mg/24hr patch Place 1 patch (14 mg total) onto the skin daily as needed (smoking cessation). 04/04/21   Leatha Gilding, MD  sertraline (ZOLOFT) 100 MG tablet Take 100 mg by mouth daily. 12/16/19   [provider]    Physical Exam: Vitals:   04/16/22 0530 04/16/22 0615 04/16/22 0617 04/16/22 0700  BP: 136/82 (!) 142/94  135/74  Pulse: 95 95  95  Resp: (!) 21 17  16   Temp:    99.8 F (37.7 C)   TempSrc:   Oral   SpO2: 98% 97%  97%   Exam  Constitutional: Middle-aged female who appears to be in no acute distress at this time Eyes: PERRL, lids and conjunctivae normal ENMT: Mucous membranes are moist.  Hoarse sounding voice Neck: normal, supple,   Respiratory: Normal respiratory effort with no significant Cardiovascular: Regular rate and rhythm, no murmurs / rubs / gallops. No extremity edema.  Tenderness palpation substernally. Abdomen: no tenderness, no masses palpated.   Bowel sounds positive.  Musculoskeletal: no clubbing / cyanosis. No joint deformity upper and lower extremities. Good ROM, no contractures. Normal muscle tone.  Skin: no rashes, lesions, ulcers. No induration Neurologic: CN 2-12 grossly intact.  Able to move all extremities. Psychiatric: Normal judgment and insight. Alert and oriented x 3. Normal mood.   Data Reviewed:  Reviewed labs, imaging, and pertinent records as noted above in HPI  Assessment and Plan: Seizure seizure disorder Patient presents after having least 1 seizure 5 days ago and then 2 seizures in the last 24 hours.  CT scan of the brain did not note any acute abnormality. When initially diagnosed with seizures 1 year ago EEG had show lateralized periodic discharges from left hemisphere.   Patient reported compliance with taking Keppra 1500 mg twice daily as prescribed.  She does continue to drink alcohol up to 6 beers per day on average.  Unclear if patient's alcohol abuse is lowering her seizure threshold. Neurology evaluated and recommended continue Keppra 1500 mg twice daily and add on Vimpat 100 mg twice daily.   -Admit to a telemetry bed -Seizure precautions -Neurochecks -Overnight EEG monitoring -Continue Keppra 1500 mg and Vimpat 100 mg twice daily -Normal saline IV fluids -Appreciate neurology consultative services we will follow-up for any further recommendations  Alcohol abuse Reports drinking a sixpack of beer  most days.  Initial alcohol level was undetectable.  Unclear if this lowered her seizure threshold. -CIWA protocols -Thiamine, folate, and MVI  Hypomagnesemia Acute.  Magnesium 1.6. -Give 2 g of magnesium sulfate IV  Essential hypertension Blood pressure 125/86-153/90 -Continue amlodipine  Depression -Continues Zoloft  Transaminitis AST 68 and ALT 28.  The AST to ALT is greater than 2:1 is consistent with alcohol abuse. -Continue to monitor  Tobacco abuse Patient smokes half a pack cigarettes per day on average has done so for her over 30 years. -Nicotine patch offered -Continue to counsel on need of cessation of tobacco use   Advance Care Planning:   Code Status: Full Code   Consults: Neurology  Family Communication: Husband updated at bedside  Severity of Illness: The appropriate patient status for this patient is OBSERVATION. Observation status is judged to be reasonable and necessary in order to provide the required intensity of service to ensure the patient's safety. The patient's presenting symptoms, physical  exam findings, and initial radiographic and laboratory data in the context of their medical condition is felt to place them at decreased risk for further clinical deterioration. Furthermore, it is anticipated that the patient will be medically stable for discharge from the hospital within 2 midnights of admission.   Author: Clydie Braun, MD 04/16/2022 9:26 AM  For on call review [email or web address].

## 2022-04-16 NOTE — ED Provider Notes (Addendum)
[place] EMERGENCY DEPARTMENT Provider Note  CSN: [id number] Arrival date & time: 04/16/22 0121  Chief Complaint(s) Seizures  HPI Martha Schneider is a 56 y.o. female with a past medical history listed below including seizures on Keppra presents to the emergency department after having a grand mal seizure at home.  Upon arrival then in triage, patient had another generalized tonic-clonic seizure requiring IM Ativan.  Husband reported compliance with 1500mg  BID Keppra dose. Still drinks a couple of beers daily. Last drink was last night.  No fevers or infections.  No cough or congestion.  No nausea vomiting diarrhea.  The history is provided by the spouse.    Past Medical History Past Medical History:  Diagnosis Date   Depression    Hypertension    Sarcoidosis    Seizures ([place])    Patient Active Problem List   Diagnosis Date Noted   Seizure ([place]) 04/08/2021   Recurrent seizures ([place]) 04/08/2021   Hypocalcemia 04/08/2021   Hypoxia 04/08/2021   Chronic leukopenia 04/04/2021   Hypertension    Depression    Tobacco abuse    Seizures ([place]) 04/03/2021   Dyspnea on exertion 01/13/2020   Current every day smoker 09/25/2019   Neutropenia ([place]) 09/25/2019   Home Medication(s) Prior to Admission medications   Medication Sig Start Date End Date Taking? Authorizing Provider  Lacosamide (VIMPAT) 100 MG TABS Take 1 tablet (100 mg total) by mouth 2 (two) times daily. 04/16/22  Yes Candiace West, Grayce Sessions, MD  amLODipine (NORVASC) 10 MG tablet Take 10 mg by mouth daily. 01/20/21   [provider]  aspirin 325 MG tablet Take 325 mg by mouth daily.    [provider]  levETIRAcetam (KEPPRA) 500 MG tablet Take 3 tablets (1,500 mg total) by mouth 2 (two) times daily. 04/11/21 07/10/21  Dahal, Marlowe Aschoff, MD  nicotine (NICODERM CQ - DOSED IN MG/24 HOURS) 14 mg/24hr patch Place 1 patch (14 mg total) onto the skin daily as needed (smoking cessation). 04/04/21   Caren Griffins, MD  sertraline (ZOLOFT) 100 MG tablet Take 100 mg by mouth daily. 12/16/19   [provider]                                                                                                                                    Allergies Patient has no known allergies.  Review of Systems Review of Systems As noted in HPI  Physical Exam Vital Signs  I have reviewed the triage vital signs BP (!) 142/94   Pulse 95   Temp 99.8 F (37.7 C) (Oral)   Resp 17   SpO2 97%   Physical Exam Vitals reviewed.  Constitutional:      General: She is not in acute distress.    Appearance: She is well-developed. She is not diaphoretic.  HENT:     Head: Normocephalic and atraumatic.     Nose: Nose normal.  Eyes:     General: No scleral icterus.       Right eye: No discharge.        Left eye: No discharge.     Conjunctiva/sclera: Conjunctivae normal.     Pupils: Pupils are equal, round, and reactive to light.  Cardiovascular:     Rate and Rhythm: Regular rhythm.     Heart sounds: No murmur heard.    No friction rub. No gallop.  Pulmonary:     Effort: Pulmonary effort is normal. No respiratory distress.     Breath sounds: Normal breath sounds. No stridor. No rales.  Abdominal:     General: There is no distension.     Palpations: Abdomen is soft.     Tenderness: There is no abdominal tenderness.  Musculoskeletal:        General: No tenderness.     Cervical back: Normal range of motion and neck supple.  Skin:    General: Skin is warm and dry.     Findings: No erythema or rash.  Neurological:     Mental Status: She is alert.     Comments: Nodding yes to all questions. Intermittently follow simple commands. Moves all extremities.     ED Results and Treatments Labs (all labs ordered are listed, but only abnormal results are displayed) Labs Reviewed  COMPREHENSIVE METABOLIC PANEL - Abnormal; Notable for the following components:      Result Value   CO2 18 (*)     Glucose, Bld 128 (*)    Total Protein 8.2 (*)    AST 68 (*)    Anion gap 17 (*)    All other components within normal limits  CBC WITH DIFFERENTIAL/PLATELET  ETHANOL                                                                                                                         EKG  EKG Interpretation  Date/Time:  Tuesday April 16 2022 03:05:58 EST Ventricular Rate:  100 PR Interval:  157 QRS Duration: 89 QT Interval:  356 QTC Calculation: 460 R Axis:   -50 Text Interpretation: Sinus tachycardia Inferior infarct, old No acute changes Confirmed by Drema Pry [phone]) on 04/16/2022 6:01:08 AM       Radiology CT Head Wo Contrast  Result Date: 04/16/2022 CLINICAL DATA:  Altered mental status. EXAM: CT HEAD WITHOUT CONTRAST TECHNIQUE: Contiguous axial images were obtained from the base of the skull through the vertex without intravenous contrast. RADIATION DOSE REDUCTION: This exam was performed according to the departmental dose-optimization program which includes automated exposure control, adjustment of the mA and/or kV according to patient size and/or use of iterative reconstruction technique. COMPARISON:  04/11/2022 FINDINGS: Brain: No evidence of acute infarction, hemorrhage, hydrocephalus, extra-axial collection or mass lesion/mass effect. Unchanged left frontal lobe white matter hypoattenuation, possibly old infarct. Vascular: No hyperdense vessel or unexpected calcification. Skull: Normal. Negative for fracture or focal lesion. Sinuses/Orbits: Paranasal sinuses and mastoid air cells are clear.  Other: None. IMPRESSION: 1. No acute intracranial abnormality. 2. Unchanged left frontal lobe white matter hypoattenuation, possibly old infarct. Electronically Signed   By: Kerby Moors M.D.   On: 04/16/2022 06:05    Medications Ordered in ED Medications  levETIRAcetam (KEPPRA) tablet 1,000 mg (has no administration in time range)  LORazepam (ATIVAN) injection 2 mg (2 mg  Intramuscular Given 04/16/22 0150)  sodium chloride 0.9 % bolus 1,000 mL (0 mLs Intravenous Stopped 04/16/22 0416)  levETIRAcetam (KEPPRA) IVPB 1500 mg/ 100 mL premix (0 mg Intravenous Stopped 04/16/22 0305)  prochlorperazine (COMPAZINE) injection 5 mg (5 mg Intravenous Given 04/16/22 0612)  sodium chloride 0.9 % bolus 1,000 mL (1,000 mLs Intravenous New Bag/Given 04/16/22 0612)  lacosamide (VIMPAT) tablet 100 mg (100 mg Oral Given 04/16/22 0653)                                                                                                                                     Procedures .1-3 Lead EKG Interpretation  Performed by: Fatima Blank, MD Authorized by: Fatima Blank, MD     Interpretation: normal     ECG rate:  101   ECG rate assessment: tachycardic     Rhythm: sinus rhythm     Ectopy: none     Conduction: normal   .Critical Care  Performed by: Fatima Blank, MD Authorized by: Fatima Blank, MD   Critical care provider statement:    Critical care time (minutes):  65   Critical care time was exclusive of:  Separately billable procedures and treating other patients   Critical care was necessary to treat or prevent imminent or life-threatening deterioration of the following conditions:  CNS failure or compromise   Critical care was time spent personally by me on the following activities:  Development of treatment plan with patient or surrogate, discussions with consultants, evaluation of patient's response to treatment, examination of patient, obtaining history from patient or surrogate, review of old charts, re-evaluation of patient's condition, pulse oximetry, ordering and review of radiographic studies, ordering and review of laboratory studies and ordering and performing treatments and interventions   (including critical care time)  Medical Decision Making / ED Course   Medical Decision Making Amount and/or Complexity of Data  Reviewed Labs: ordered. Decision-making details documented in ED Course. Radiology: ordered and independent interpretation performed. Decision-making details documented in ED Course. ECG/medicine tests: ordered and independent interpretation performed. Decision-making details documented in ED Course.  Risk Prescription drug management. Decision regarding hospitalization.   Patient presents for seizures.  Had a less than 3-minute seizure here and received IM Ativan. Currently postictal. Will provide with IV fluids and dose of Keppra load. Will obtain basic labs to assess for any electrolyte or metabolic derangements.  Will also get ethanol level  0200AM: Patient's mental status improving, still confused.  0300AM: Unchanged.  0420AM: Slight improvement, but still disoriented.  [id number]: No change.  CT obtained and negative for mass or ICH. Consulted Dr. Leonel Ramsay from neurology who will evaluate patient in the emergency department.  6:46 AM After evaluation, neurology recommended admission for EEG. Start Vimpat in ED.       Final Clinical Impression(s) / ED Diagnoses Final diagnoses:  Seizure [facility])           This chart was dictated using voice recognition software.  Despite best efforts to proofread,  errors can occur which can change the documentation meaning.        Fatima Blank, MD 04/16/22 [phone]

## 2022-04-16 NOTE — ED Notes (Signed)
Patient is currently seizing at this time , assisted by triage RN / PA , charge nurse notified.

## 2022-04-16 NOTE — ED Notes (Signed)
Discharge instructions reviewed with patient and spouse. Denies any concerns or questions at this time. Ambulatory out of ED.

## 2022-04-18 ENCOUNTER — Inpatient Hospital Stay: Payer: Self-pay | Admitting: Neurology

## 2022-04-18 ENCOUNTER — Encounter: Payer: Self-pay | Admitting: Neurology

## 2022-06-04 ENCOUNTER — Ambulatory Visit (INDEPENDENT_AMBULATORY_CARE_PROVIDER_SITE_OTHER): Payer: 59 | Admitting: Neurology

## 2022-06-04 ENCOUNTER — Encounter: Payer: Self-pay | Admitting: Neurology

## 2022-06-04 VITALS — BP 122/74 | Ht 63.0 in | Wt 136.0 lb

## 2022-06-04 DIAGNOSIS — G40909 Epilepsy, unspecified, not intractable, without status epilepticus: Secondary | ICD-10-CM

## 2022-06-04 DIAGNOSIS — G8929 Other chronic pain: Secondary | ICD-10-CM

## 2022-06-04 DIAGNOSIS — R519 Headache, unspecified: Secondary | ICD-10-CM | POA: Diagnosis not present

## 2022-06-04 DIAGNOSIS — Z5181 Encounter for therapeutic drug level monitoring: Secondary | ICD-10-CM | POA: Diagnosis not present

## 2022-06-04 MED ORDER — LACOSAMIDE 100 MG PO TABS: 100.0000 mg | ORAL_TABLET | Freq: Two times a day (BID) | ORAL | 6 refills | Status: DC

## 2022-06-04 MED ORDER — SUMATRIPTAN SUCCINATE 50 MG PO TABS: 50.0000 mg | ORAL_TABLET | ORAL | 6 refills | Status: DC | PRN

## 2022-06-04 MED ORDER — SERTRALINE HCL 100 MG PO TABS: 100.0000 mg | ORAL_TABLET | Freq: Every day | ORAL | 0 refills | Status: AC

## 2022-06-04 MED ORDER — LEVETIRACETAM 500 MG PO TABS: 1500.0000 mg | ORAL_TABLET | Freq: Two times a day (BID) | ORAL | 6 refills | Status: DC

## 2022-06-04 NOTE — Patient Instructions (Signed)
Continue with Keppra 1500 mg twice daily  Continue with Vimpat 200 mg twice daily Restart Zoloft, 50 mg (half a tab) daily for 2 weeks then increase to 100 mg daily Start Imitrex as needed for severe headaches, can take a second tablet 2 hours if headaches not resolved Will obtain labs today including Keppra level, Vimpat level and vitamin D Follow-up in 3 months or sooner if worse.

## 2022-06-04 NOTE — Progress Notes (Unsigned)
[facility]  PATIENT: Martha Schneider DOB: 07/31/1965  REQUESTING CLINICIAN: Koirala, Dibas, MD HISTORY FROM: Patient and husband  REASON FOR VISIT: Seizure disorder    HISTORICAL  CHIEF COMPLAINT:  Chief Complaint  Patient presents with   New Patient (Initial Visit)    RM 50 with husband here for consult on seizures. F/u recommended by ER. First seizure was last year while working. Pt reports most recent seizure was Dec of 2023.     HISTORY OF PRESENT ILLNESS:  This is a 57 year old woman past medical history of hypertension, sarcoidosis, seizure disorder who is presenting to establish care.  Patient reports her first seizure was in December 2022. She was in her car taking a break and the next thing that she knows is waking up in the hospital.  She was told that she had a seizure while in the car. Her workup at that time showed a normal brain imaging but her EEG was abnormal. She was started on Keppra.  She was on Keppra, doing well and in December 2023, had a breakthrough seizure.  Both husband and son found her in on the floor in her bedroom unresponsive.  She was taken to the hospital, observed for another day.  On discharge, Vimpat was added.  Currently she is on Keppra 1500 mg twice daily and Vimpat 100 mg twice daily, denies any side effect from the medication.  Her main complaint right now is headaches, patient was previously on Zoloft but since running out of medication she has not refilled it.   Handedness: Right handed   Onset: December 2022   Seizure Type: Convulsion   Current frequency: Total of 2 or possible 3 seizures   Any injuries from seizures: Denies   Seizure risk factors:  Denies   Previous ASMs: Levetiracetam   Currenty ASMs: Levetiracetam 1500 mg twice daily, Lacosamide 100 mg twice daily   ASMs side effects: Denies   Brain Images: No acute findings   Previous EEGs: Focal motor seizure seen on EEG, L LPD +R   OTHER MEDICAL  CONDITIONS: Seizure disorder, Hypertension, Depression   REVIEW OF SYSTEMS: Full 14 system review of systems performed and negative with exception of: As noted in the HPI   ALLERGIES: No Known Allergies  HOME MEDICATIONS: Outpatient Medications Prior to Visit  Medication Sig Dispense Refill   amLODipine (NORVASC) 10 MG tablet Take 10 mg by mouth daily.     aspirin 325 MG tablet Take 325 mg by mouth daily.     diazepam (DIASTAT) 2.5 MG GEL Place 2.5 mg rectally as needed for seizure. 1 kit 1   nicotine (NICODERM CQ - DOSED IN MG/24 HOURS) 14 mg/24hr patch Place 1 patch (14 mg total) onto the skin daily. 28 patch 0   Lacosamide (VIMPAT) 100 MG TABS Take 1 tablet (100 mg total) by mouth 2 (two) times daily. 60 tablet 0   sertraline (ZOLOFT) 100 MG tablet Take 100 mg by mouth daily.     levETIRAcetam (KEPPRA) 500 MG tablet Take 3 tablets (1,500 mg total) by mouth 2 (two) times daily. 180 tablet 2   No facility-administered medications prior to visit.    PAST MEDICAL HISTORY: Past Medical History:  Diagnosis Date   Depression    Hypertension    Sarcoidosis    Seizures ([place])     PAST SURGICAL HISTORY: History reviewed. No pertinent surgical history.  FAMILY HISTORY: Family History  Problem Relation Age of Onset   Heart attack Mother  Liver cancer Father    Diabetes Mellitus II Sister    Kidney disease Sister     SOCIAL HISTORY: Social History   Socioeconomic History   Marital status: Married    Spouse name: Not on file   Number of children: 2   Years of education: Not on file   Highest education level: Not on file  Occupational History   Not on file  Tobacco Use   Smoking status: Every Day    Packs/day: 0.50    Years: 37.00    Total pack years: 18.50    Types: Cigarettes   Smokeless tobacco: Never  Vaping Use   Vaping Use: Never used  Substance and Sexual Activity   Alcohol use: Yes    Comment: occasionally   Drug use: No   Sexual activity: Not on file   Other Topics Concern   Not on file  Social History Narrative   Not on file   Social Determinants of Health   Financial Resource Strain: Not on file  Food Insecurity: Not on file  Transportation Needs: Not on file  Physical Activity: Not on file  Stress: Not on file  Social Connections: Not on file  Intimate Partner Violence: Not on file    PHYSICAL EXAM  GENERAL EXAM/CONSTITUTIONAL: Vitals:  Vitals:   06/04/22 1515  BP: 122/74  Weight: 136 lb (61.7 kg)  Height: 5\' 3"  (1.6 m)   Body mass index is 24.09 kg/m. Wt Readings from Last 3 Encounters:  06/04/22 136 lb (61.7 kg)  04/11/22 130 lb (59 kg)  04/08/21 112 lb 8 oz (51 kg)   Patient is in no distress; well developed, nourished and groomed; neck is supple  EYES: Visual fields full to confrontation, Extraocular movements intacts,  No results found.  MUSCULOSKELETAL: Gait, strength, tone, movements noted in Neurologic exam below  NEUROLOGIC: MENTAL STATUS:      No data to display         awake, alert, oriented to person, place and time recent and remote memory intact normal attention and concentration language fluent, comprehension intact, naming intact fund of knowledge appropriate  CRANIAL NERVE:  2nd, 3rd, 4th, 6th - Visual fields full to confrontation, extraocular muscles intact, no nystagmus 5th - facial sensation symmetric 7th - facial strength symmetric 8th - hearing intact 9th - palate elevates symmetrically, uvula midline 11th - shoulder shrug symmetric 12th - tongue protrusion midline  MOTOR:  normal bulk and tone, full strength in the BUE, BLE  SENSORY:  normal and symmetric to light touch  COORDINATION:  finger-nose-finger, fine finger movements normal  REFLEXES:  deep tendon reflexes present and symmetric  GAIT/STATION:  normal   DIAGNOSTIC DATA (LABS, IMAGING, TESTING) - I reviewed patient records, labs, notes, testing and imaging myself where available.  Lab Results   Component Value Date   WBC 4.9 04/16/2022   HGB 14.1 04/16/2022   HCT 44.3 04/16/2022   MCV 91.9 04/16/2022   PLT 200 04/16/2022      Component Value Date/Time   NA 137 04/16/2022 0159   K 4.1 04/16/2022 0159   CL 102 04/16/2022 0159   CO2 18 (L) 04/16/2022 0159   GLUCOSE 128 (H) 04/16/2022 0159   BUN 9 04/16/2022 0159   CREATININE 0.76 04/16/2022 0159   CREATININE 0.78 05/10/2020 1037   CREATININE 0.76 12/17/2013 1653   CALCIUM 9.4 04/16/2022 0159   PROT 8.2 (H) 04/16/2022 0159   ALBUMIN 4.5 04/16/2022 0159   AST 68 (H) 04/16/2022 0159  AST 26 05/10/2020 1037   ALT 28 04/16/2022 0159   ALT 14 05/10/2020 1037   ALKPHOS 68 04/16/2022 0159   BILITOT 0.7 04/16/2022 0159   BILITOT 0.6 05/10/2020 1037   GFRNONAA >60 04/16/2022 0159   GFRNONAA >60 05/10/2020 1037   GFRNONAA >89 12/17/2013 1653   GFRAA >60 10/20/2019 1028   GFRAA >89 12/17/2013 1653   Lab Results  Component Value Date   CHOL 162 02/06/2007   HDL 61 02/06/2007   LDLCALC 81 02/06/2007   TRIG 100 02/06/2007   Lab Results  Component Value Date   HGBA1C 5.7 (H) 12/17/2013   Lab Results  Component Value Date   VITAMINB12 1,267 (H) 10/20/2019   Lab Results  Component Value Date   TSH 3.905 04/08/2021    LTM 04/09/2021 -Focal seizure, left frontotemporal region -Brief ictal-interictal rhythmic discharges, left hemisphere - Lateralized periodic discharges with superimposed rhythmicity ( LPD +R) left hemisphere - Continuous slow, generalized   MRI Brain 04/10/2021 Low level restricted diffusion, increased T2 signal and increased FLAIR signal within the left medial thalamus, left cingulate gyrus, anterior left frontal lobe, and left insula. These findings are felt most likely to be postictal phenomenon rather than ischemic infarctions   ASSESSMENT AND PLAN  57 y.o. year old female  with hypertension, sarcoid, seizure disorder who is presenting to establish care.  For her seizure disorder, she is  currently on levetiracetam 1500 mg twice daily and lacosamide 100 mg twice daily.  I will check a level and we will continue patient on those 2 medications.  Will also restart her Zoloft, 50 mg nightly for 2 weeks then increase to 100 mg..  Previously she was on 100 mg daily.  Maybe her headaches are related to discontinuing the Zoloft.  I will also give her sumatriptan's to use as needed.  I will see her in 3 months for follow-up.  Advised patient to contact me if she has any seizure or seizure-like activity.  They voiced understanding.   1. Nonintractable epilepsy without status epilepticus, unspecified epilepsy type ([place])   2. Therapeutic drug monitoring     Patient Instructions  Continue with Keppra 1500 mg twice daily  Continue with Vimpat 200 mg twice daily Restart Zoloft, 50 mg (half a tab) daily for 2 weeks then increase to 100 mg daily Start Imitrex as needed for severe headaches, can take a second tablet 2 hours if headaches not resolved Will obtain labs today including Keppra level, Vimpat level and vitamin D Follow-up in 3 months or sooner if worse.   Per [facility] statutes, patients with seizures are not allowed to drive until they have been seizure-free for six months.  Other recommendations include using caution when using heavy equipment or power tools. Avoid working on ladders or at heights. Take showers instead of baths.  Do not swim alone.  Ensure the water temperature is not too high on the home water heater. Do not go swimming alone. Do not lock yourself in a room alone (i.e. bathroom). When caring for infants or small children, sit down when holding, feeding, or changing them to minimize risk of injury to the child in the event you have a seizure. Maintain good sleep hygiene. Avoid alcohol.  Also recommend adequate sleep, hydration, good diet and minimize stress.   During the Seizure  - First, ensure adequate ventilation and place patients on the floor on their  left side  Loosen clothing around the neck and ensure the airway is  patent. If the patient is clenching the teeth, do not force the mouth open with any object as this can cause severe damage - Remove all items from the surrounding that can be hazardous. The patient may be oblivious to what's happening and may not even know what he or she is doing. If the patient is confused and wandering, either gently guide him/her away and block access to outside areas - Reassure the individual and be comforting - Call 911. In most cases, the seizure ends before EMS arrives. However, there are cases when seizures may last over 3 to 5 minutes. Or the individual may have developed breathing difficulties or severe injuries. If a pregnant patient or a person with diabetes develops a seizure, it is prudent to call an ambulance. - Finally, if the patient does not regain full consciousness, then call EMS. Most patients will remain confused for about 45 to 90 minutes after a seizure, so you must use judgment in calling for help. - Avoid restraints but make sure the patient is in a bed with padded side rails - Place the individual in a lateral position with the neck slightly flexed; this will help the saliva drain from the mouth and prevent the tongue from falling backward - Remove all nearby furniture and other hazards from the area - Provide verbal assurance as the individual is regaining consciousness - Provide the patient with privacy if possible - Call for help and start treatment as ordered by the caregiver   After the Seizure (Postictal Stage)  After a seizure, most patients experience confusion, fatigue, muscle pain and/or a headache. Thus, one should permit the individual to sleep. For the next few days, reassurance is essential. Being calm and helping reorient the person is also of importance.  Most seizures are painless and end spontaneously. Seizures are not harmful to others but can lead to complications such  as stress on the lungs, brain and the heart. Individuals with prior lung problems may develop labored breathing and respiratory distress.     Orders Placed This Encounter  Procedures   Levetiracetam level   Lacosamide   Vitamin D, 25-hydroxy    Meds ordered this encounter  Medications   Lacosamide (VIMPAT) 100 MG TABS    Sig: Take 1 tablet (100 mg total) by mouth 2 (two) times daily.    Dispense:  60 tablet    Refill:  6   levETIRAcetam (KEPPRA) 500 MG tablet    Sig: Take 3 tablets (1,500 mg total) by mouth 2 (two) times daily.    Dispense:  180 tablet    Refill:  6   sertraline (ZOLOFT) 100 MG tablet    Sig: Take 1 tablet (100 mg total) by mouth daily.    Dispense:  30 tablet    Refill:  0   SUMAtriptan (IMITREX) 50 MG tablet    Sig: Take 1 tablet (50 mg total) by mouth every 2 (two) hours as needed for migraine. May repeat in 2 hours if headache persists or recurs.    Dispense:  10 tablet    Refill:  6    Return in about 3 months (around 09/02/2022).    Alric Ran, MD 06/05/2022, 4:45 PM  [facility] [address] [place], [place] [address] [phone]

## 2022-06-10 LAB — LEVETIRACETAM LEVEL: Levetiracetam Lvl: 48.7 ug/mL — ABNORMAL HIGH (ref 10.0–40.0)

## 2022-06-10 LAB — VITAMIN D 25 HYDROXY (VIT D DEFICIENCY, FRACTURES): Vit D, 25-Hydroxy: 35 ng/mL (ref 30.0–100.0)

## 2022-06-10 LAB — LACOSAMIDE: Lacosamide: 6.7 ug/mL (ref 5.0–10.0)

## 2022-09-18 ENCOUNTER — Ambulatory Visit (INDEPENDENT_AMBULATORY_CARE_PROVIDER_SITE_OTHER): Payer: 59 | Admitting: Neurology

## 2022-09-18 ENCOUNTER — Encounter: Payer: Self-pay | Admitting: Neurology

## 2022-09-18 VITALS — BP 118/77 | HR 84 | Ht 63.0 in | Wt 139.5 lb

## 2022-09-18 DIAGNOSIS — G40909 Epilepsy, unspecified, not intractable, without status epilepticus: Secondary | ICD-10-CM

## 2022-09-18 DIAGNOSIS — R519 Headache, unspecified: Secondary | ICD-10-CM

## 2022-09-18 MED ORDER — LACOSAMIDE 100 MG PO TABS: 100.0000 mg | ORAL_TABLET | Freq: Two times a day (BID) | ORAL | 5 refills | Status: DC

## 2022-09-18 NOTE — Patient Instructions (Signed)
Continue with levetiracetam 1500 mg twice daily Continue lacosamide 100 mg twice daily Continue with sumatriptan as needed for headaches Follow-up in 1 year or sooner if worse.

## 2022-09-18 NOTE — Progress Notes (Signed)
[facility]  PATIENT: Martha Schneider DOB: [birth year]  REQUESTING CLINICIAN: Koirala, Dibas, MD HISTORY FROM: Patient and husband  REASON FOR VISIT: Seizure disorder    HISTORICAL  CHIEF COMPLAINT:  Chief Complaint  Patient presents with   Follow-up    Rm 12. Patient with husband, reports some headaches but no seizures. Patient reports being out of lacosamide and will need refill at this time.    INTERVAL HISTORY 09/18/2022:  Patient presents today for follow-up, she is accompanied by her husband.  Last visit was in February.  Since then she has not had any seizure or seizure-like activity.  She is compliant with the Keppra and lacosamide.  Denies any side effect from the medication. In terms of the headaches, she reports 2 headaches per month.  Right-sided headache, on the temporal area, described it as aching pain.  Reports that sumatriptan helps relieve the headaches but will make her drowsy and sleepy.  No other concerns or complaints.   HISTORY OF PRESENT ILLNESS:  This is a 57 year old woman past medical history of hypertension, sarcoidosis, seizure disorder who is presenting to establish care.  Patient reports her first seizure was in December 2022. She was in her car taking a break and the next thing that she knows is waking up in the hospital.  She was told that she had a seizure while in the car. Her workup at that time showed a normal brain imaging but her EEG was abnormal. She was started on Keppra.  She was on Keppra, doing well and in December 2023, had a breakthrough seizure.  Both husband and son found her in on the floor in her bedroom unresponsive.  She was taken to the hospital, observed for another day.  On discharge, Vimpat was added.  Currently she is on Keppra 1500 mg twice daily and Vimpat 100 mg twice daily, denies any side effect from the medication.  Her main complaint right now is headaches, patient was previously on Zoloft but since running out of  medication she has not refilled it.   Handedness: Right handed   Onset: December 2022   Seizure Type: Convulsion   Current frequency: Total of 2 or possible 3 seizures   Any injuries from seizures: Denies   Seizure risk factors:  Denies   Previous ASMs: Levetiracetam   Currenty ASMs: Levetiracetam 1500 mg twice daily, Lacosamide 100 mg twice daily   ASMs side effects: Denies   Brain Images: No acute findings   Previous EEGs: Focal motor seizure seen on EEG, L LPD +R   OTHER MEDICAL CONDITIONS: Seizure disorder, Hypertension, Depression   REVIEW OF SYSTEMS: Full 14 system review of systems performed and negative with exception of: As noted in the HPI   ALLERGIES: No Known Allergies  HOME MEDICATIONS: Outpatient Medications Prior to Visit  Medication Sig Dispense Refill   amLODipine (NORVASC) 10 MG tablet Take 10 mg by mouth daily.     aspirin 325 MG tablet Take 325 mg by mouth daily.     diazepam (DIASTAT) 2.5 MG GEL Place 2.5 mg rectally as needed for seizure. 1 kit 1   levETIRAcetam (KEPPRA) 500 MG tablet Take 3 tablets (1,500 mg total) by mouth 2 (two) times daily. 180 tablet 6   nicotine (NICODERM CQ - DOSED IN MG/24 HOURS) 14 mg/24hr patch Place 1 patch (14 mg total) onto the skin daily. 28 patch 0   SUMAtriptan (IMITREX) 50 MG tablet Take 1 tablet (50 mg total) by mouth every 2 (  two) hours as needed for migraine. May repeat in 2 hours if headache persists or recurs. 10 tablet 6   Lacosamide (VIMPAT) 100 MG TABS Take 1 tablet (100 mg total) by mouth 2 (two) times daily. 60 tablet 6   sertraline (ZOLOFT) 100 MG tablet Take 1 tablet (100 mg total) by mouth daily. 30 tablet 0   No facility-administered medications prior to visit.    PAST MEDICAL HISTORY: Past Medical History:  Diagnosis Date   Depression    Hypertension    Sarcoidosis    Seizures (HCC)     PAST SURGICAL HISTORY: History reviewed. No pertinent surgical history.  FAMILY HISTORY: Family  History  Problem Relation Age of Onset   Heart attack Mother    Liver cancer Father    Diabetes Mellitus II Sister    Kidney disease Sister     SOCIAL HISTORY: Social History   Socioeconomic History   Marital status: Married    Spouse name: Not on file   Number of children: 2   Years of education: Not on file   Highest education level: Not on file  Occupational History   Not on file  Tobacco Use   Smoking status: Every Day    Packs/day: 0.50    Years: 37.00    Additional pack years: 0.00    Total pack years: 18.50    Types: Cigarettes   Smokeless tobacco: Never  Vaping Use   Vaping Use: Never used  Substance and Sexual Activity   Alcohol use: Yes    Comment: occasionally   Drug use: No   Sexual activity: Not on file  Other Topics Concern   Not on file  Social History Narrative   Not on file   Social Determinants of Health   Financial Resource Strain: Not on file  Food Insecurity: Not on file  Transportation Needs: Not on file  Physical Activity: Not on file  Stress: Not on file  Social Connections: Not on file  Intimate Partner Violence: Not on file    PHYSICAL EXAM  GENERAL EXAM/CONSTITUTIONAL: Vitals:  Vitals:   09/18/22 1534  BP: 118/77  Pulse: 84  Weight: 139 lb 8 oz (63.3 kg)  Height: 5\' 3"  (1.6 m)    Body mass index is 24.71 kg/m. Wt Readings from Last 3 Encounters:  09/18/22 139 lb 8 oz (63.3 kg)  06/04/22 136 lb (61.7 kg)  04/11/22 130 lb (59 kg)   Patient is in no distress; well developed, nourished and groomed; neck is supple  MUSCULOSKELETAL: Gait, strength, tone, movements noted in Neurologic exam below  NEUROLOGIC: MENTAL STATUS:      No data to display         awake, alert, oriented to person, place and time recent and remote memory intact normal attention and concentration language fluent, comprehension intact, naming intact fund of knowledge appropriate  CRANIAL NERVE:  2nd, 3rd, 4th, 6th - Visual fields full to  confrontation, extraocular muscles intact, no nystagmus 5th - facial sensation symmetric 7th - facial strength symmetric 8th - hearing intact 9th - palate elevates symmetrically, uvula midline 11th - shoulder shrug symmetric 12th - tongue protrusion midline  MOTOR:  normal bulk and tone, full strength in the BUE, BLE  SENSORY:  normal and symmetric to light touch  COORDINATION:  finger-nose-finger, fine finger movements normal  REFLEXES:  deep tendon reflexes present and symmetric  GAIT/STATION:  normal   DIAGNOSTIC DATA (LABS, IMAGING, TESTING) - I reviewed patient records, labs, notes, testing  and imaging myself where available.  Lab Results  Component Value Date   WBC 4.9 04/16/2022   HGB 14.1 04/16/2022   HCT 44.3 04/16/2022   MCV 91.9 04/16/2022   PLT 200 04/16/2022      Component Value Date/Time   NA 137 04/16/2022 0159   K 4.1 04/16/2022 0159   CL 102 04/16/2022 0159   CO2 18 (L) 04/16/2022 0159   GLUCOSE 128 (H) 04/16/2022 0159   BUN 9 04/16/2022 0159   CREATININE 0.76 04/16/2022 0159   CREATININE 0.78 05/10/2020 1037   CREATININE 0.76 12/17/2013 1653   CALCIUM 9.4 04/16/2022 0159   PROT 8.2 (H) 04/16/2022 0159   ALBUMIN 4.5 04/16/2022 0159   AST 68 (H) 04/16/2022 0159   AST 26 05/10/2020 1037   ALT 28 04/16/2022 0159   ALT 14 05/10/2020 1037   ALKPHOS 68 04/16/2022 0159   BILITOT 0.7 04/16/2022 0159   BILITOT 0.6 05/10/2020 1037   GFRNONAA >60 04/16/2022 0159   GFRNONAA >60 05/10/2020 1037   GFRNONAA >89 12/17/2013 1653   GFRAA >60 10/20/2019 1028   GFRAA >89 12/17/2013 1653   Lab Results  Component Value Date   CHOL 162 02/06/2007   HDL 61 02/06/2007   LDLCALC 81 02/06/2007   TRIG 100 02/06/2007   Lab Results  Component Value Date   HGBA1C 5.7 (H) 12/17/2013   Lab Results  Component Value Date   VITAMINB12 1,267 (H) 10/20/2019   Lab Results  Component Value Date   TSH 3.905 04/08/2021    LTM 04/09/2021 -Focal seizure, left  frontotemporal region -Brief ictal-interictal rhythmic discharges, left hemisphere - Lateralized periodic discharges with superimposed rhythmicity ( LPD +R) left hemisphere - Continuous slow, generalized   MRI Brain 04/10/2021 Low level restricted diffusion, increased T2 signal and increased FLAIR signal within the left medial thalamus, left cingulate gyrus, anterior left frontal lobe, and left insula. These findings are felt most likely to be postictal phenomenon rather than ischemic infarctions   ASSESSMENT AND PLAN  57 y.o. year old female  with hypertension, sarcoid, seizure disorder who is presenting for follow up.  Doing well on Keppra and Vimpat, plan will be to continue with Keppra 1500 mg twice daily and Vimpat 100 mg twice daily. In terms of her headaches, we will continue patient on sumatriptan since it has been helpful.  Continue to follow with PCP and return in 1 year or sooner if worse.  Patient understands to contact us if she has a breakthrough seizure.   1. Nonintractable epilepsy without status epilepticus, unspecified epilepsy type (HCC)   2. Nonintractable episodic headache, unspecified headache type      Patient Instructions  Continue with levetiracetam 1500 mg twice daily Continue lacosamide 100 mg twice daily Continue with sumatriptan as needed for headaches Follow-up in 1 year or sooner if worse.   Per [facility] statutes, patients with seizures are not allowed to drive until they have been seizure-free for six months.  Other recommendations include using caution when using heavy equipment or power tools. Avoid working on ladders or at heights. Take showers instead of baths.  Do not swim alone.  Ensure the water temperature is not too high on the home water heater. Do not go swimming alone. Do not lock yourself in a room alone (i.e. bathroom). When caring for infants or small children, sit down when holding, feeding, or changing them to minimize risk of  injury to the child in the event you have a seizure.  Maintain good sleep hygiene. Avoid alcohol.  Also recommend adequate sleep, hydration, good diet and minimize stress.   During the Seizure  - First, ensure adequate ventilation and place patients on the floor on their left side  Loosen clothing around the neck and ensure the airway is patent. If the patient is clenching the teeth, do not force the mouth open with any object as this can cause severe damage - Remove all items from the surrounding that can be hazardous. The patient may be oblivious to what's happening and may not even know what he or she is doing. If the patient is confused and wandering, either gently guide him/her away and block access to outside areas - Reassure the individual and be comforting - Call 911. In most cases, the seizure ends before EMS arrives. However, there are cases when seizures may last over 3 to 5 minutes. Or the individual may have developed breathing difficulties or severe injuries. If a pregnant patient or a person with diabetes develops a seizure, it is prudent to call an ambulance. - Finally, if the patient does not regain full consciousness, then call EMS. Most patients will remain confused for about 45 to 90 minutes after a seizure, so you must use judgment in calling for help. - Avoid restraints but make sure the patient is in a bed with padded side rails - Place the individual in a lateral position with the neck slightly flexed; this will help the saliva drain from the mouth and prevent the tongue from falling backward - Remove all nearby furniture and other hazards from the area - Provide verbal assurance as the individual is regaining consciousness - Provide the patient with privacy if possible - Call for help and start treatment as ordered by the caregiver   After the Seizure (Postictal Stage)  After a seizure, most patients experience confusion, fatigue, muscle pain and/or a headache. Thus, one  should permit the individual to sleep. For the next few days, reassurance is essential. Being calm and helping reorient the person is also of importance.  Most seizures are painless and end spontaneously. Seizures are not harmful to others but can lead to complications such as stress on the lungs, brain and the heart. Individuals with prior lung problems may develop labored breathing and respiratory distress.     No orders of the defined types were placed in this encounter.   Meds ordered this encounter  Medications   Lacosamide (VIMPAT) 100 MG TABS    Sig: Take 1 tablet (100 mg total) by mouth 2 (two) times daily.    Dispense:  60 tablet    Refill:  5    Return in about 1 year (around 09/18/2023).    Windell Norfolk, MD 09/18/2022, 4:16 PM  [facility] [address] [place], [place] [address] [phone]

## 2022-10-02 ENCOUNTER — Other Ambulatory Visit: Payer: Self-pay

## 2022-10-02 ENCOUNTER — Encounter (HOSPITAL_COMMUNITY): Payer: Self-pay | Admitting: Emergency Medicine

## 2022-10-02 ENCOUNTER — Emergency Department (HOSPITAL_COMMUNITY)
Admission: EM | Admit: 2022-10-02 | Discharge: 2022-10-03 | Disposition: A | Discharge status: Home / Self Care | Payer: 59 | Attending: Emergency Medicine | Admitting: Emergency Medicine

## 2022-10-02 ENCOUNTER — Emergency Department (HOSPITAL_COMMUNITY): Payer: 59

## 2022-10-02 DIAGNOSIS — R519 Headache, unspecified: Secondary | ICD-10-CM | POA: Diagnosis not present

## 2022-10-02 DIAGNOSIS — R410 Disorientation, unspecified: Secondary | ICD-10-CM | POA: Insufficient documentation

## 2022-10-02 DIAGNOSIS — R569 Unspecified convulsions: Secondary | ICD-10-CM

## 2022-10-02 DIAGNOSIS — Z79899 Other long term (current) drug therapy: Secondary | ICD-10-CM | POA: Diagnosis not present

## 2022-10-02 DIAGNOSIS — Z7982 Long term (current) use of aspirin: Secondary | ICD-10-CM | POA: Diagnosis not present

## 2022-10-02 LAB — CBC WITH DIFFERENTIAL/PLATELET
Abs Immature Granulocytes: 0.02 10*3/uL (ref 0.00–0.07)
Basophils Absolute: 0 10*3/uL (ref 0.0–0.1)
Basophils Relative: 0 %
Eosinophils Absolute: 0 10*3/uL (ref 0.0–0.5)
Eosinophils Relative: 0 %
HCT: 40.7 % (ref 36.0–46.0)
Hemoglobin: 13.6 g/dL (ref 12.0–15.0)
Immature Granulocytes: 1 %
Lymphocytes Relative: 20 %
Lymphs Abs: 0.8 10*3/uL (ref 0.7–4.0)
MCH: 29.3 pg (ref 26.0–34.0)
MCHC: 33.4 g/dL (ref 30.0–36.0)
MCV: 87.7 fL (ref 80.0–100.0)
Monocytes Absolute: 0.3 10*3/uL (ref 0.1–1.0)
Monocytes Relative: 9 %
Neutro Abs: 2.8 10*3/uL (ref 1.7–7.7)
Neutrophils Relative %: 70 %
Platelets: 247 10*3/uL (ref 150–400)
RBC: 4.64 MIL/uL (ref 3.87–5.11)
RDW: 13.1 % (ref 11.5–15.5)
WBC: 4 10*3/uL (ref 4.0–10.5)
nRBC: 0 % (ref 0.0–0.2)

## 2022-10-02 LAB — URINALYSIS, ROUTINE W REFLEX MICROSCOPIC
Bacteria, UA: NONE SEEN
Bilirubin Urine: NEGATIVE
Glucose, UA: NEGATIVE mg/dL
Hgb urine dipstick: NEGATIVE
Ketones, ur: 5 mg/dL — AB
Leukocytes,Ua: NEGATIVE
Nitrite: NEGATIVE
Protein, ur: 100 mg/dL — AB
Specific Gravity, Urine: 1.023 (ref 1.005–1.030)
pH: 5 (ref 5.0–8.0)

## 2022-10-02 LAB — COMPREHENSIVE METABOLIC PANEL
ALT: 20 U/L (ref 0–44)
AST: 49 U/L — ABNORMAL HIGH (ref 15–41)
Albumin: 4.1 g/dL (ref 3.5–5.0)
Alkaline Phosphatase: 67 U/L (ref 38–126)
Anion gap: 13 (ref 5–15)
BUN: 11 mg/dL (ref 6–20)
CO2: 21 mmol/L — ABNORMAL LOW (ref 22–32)
Calcium: 8.4 mg/dL — ABNORMAL LOW (ref 8.9–10.3)
Chloride: 98 mmol/L (ref 98–111)
Creatinine, Ser: 0.67 mg/dL (ref 0.44–1.00)
GFR, Estimated: >60 mL/min (ref >60)
Glucose, Bld: 93 mg/dL (ref 70–99)
Potassium: 4.5 mmol/L (ref 3.5–5.1)
Sodium: 132 mmol/L — ABNORMAL LOW (ref 135–145)
Total Bilirubin: 1 mg/dL (ref 0.3–1.2)
Total Protein: 8.2 g/dL — ABNORMAL HIGH (ref 6.5–8.1)

## 2022-10-02 MED ORDER — LEVETIRACETAM IN NACL 1000 MG/100ML IV SOLN
1000.0000 mg | Freq: Two times a day (BID) | INTRAVENOUS | Status: DC
  Administered 2022-10-02: 1000 mg via INTRAVENOUS
  Filled 2022-10-02: qty 100

## 2022-10-02 MED ORDER — ACETAMINOPHEN 650 MG RE SUPP
650.0000 mg | Freq: Once | RECTAL | Status: DC
  Filled 2022-10-02: qty 1

## 2022-10-02 MED ORDER — LACOSAMIDE 50 MG PO TABS
150.0000 mg | ORAL_TABLET | Freq: Two times a day (BID) | ORAL | Status: DC
  Administered 2022-10-02: 150 mg via ORAL
  Filled 2022-10-02: qty 3

## 2022-10-02 MED ORDER — ACETAMINOPHEN 325 MG PO TABS
650.0000 mg | ORAL_TABLET | Freq: Once | ORAL | Status: AC
  Administered 2022-10-02: 650 mg via ORAL
  Filled 2022-10-02: qty 2

## 2022-10-02 MED ORDER — LACOSAMIDE 100 MG PO TABS: 150.0000 mg | ORAL_TABLET | Freq: Two times a day (BID) | ORAL | 2 refills | Status: DC

## 2022-10-02 MED ORDER — LEVETIRACETAM IN NACL 1000 MG/100ML IV SOLN
1000.0000 mg | Freq: Once | INTRAVENOUS | Status: AC
  Administered 2022-10-02: 1000 mg via INTRAVENOUS
  Filled 2022-10-02: qty 100

## 2022-10-02 MED ORDER — LEVETIRACETAM 500 MG PO TABS: 1500.0000 mg | ORAL_TABLET | Freq: Two times a day (BID) | ORAL | 6 refills | Status: DC

## 2022-10-02 NOTE — ED Provider Notes (Signed)
[place] EMERGENCY DEPARTMENT AT [facility] Provider Note   CSN: [id number] Arrival date & time: 10/02/22  1814     History  Chief Complaint  Patient presents with   Seizures    Pt BIBA after having a 5 min grandmal seizure and has had a H/A for 3 days per her husband.Per EMS Pt has been post-dictal for 40 mins, responds to verbal stimuli,follow commands. Pt only A&O x2(Name/DOB) while triaging.     Martha Schneider is a 57 y.o. female.  HPI   57 year old female with past medical history of epilepsy noted to be compliant with her medication presents emergency department with breakthrough seizure.  History obtained from husband as patient is still sleepy and not back to baseline.  He states that she has had a headache for the past 3 days.  This is how they know that a breakthrough seizure was oncoming.  He then witnessed her have about a 5-minute generalized tonic-clonic seizure.  She has not returned to baseline.  Last time this happened was about 6 months ago when she was admitted for evaluation.  Husband states that she is otherwise been in her baseline health, compliant with medication, no recent fever or other acute changes.  Patient is noncontributory secondary to postictal phase.  Home Medications Prior to Admission medications   Medication Sig Start Date End Date Taking? Authorizing Provider  amLODipine (NORVASC) 10 MG tablet Take 10 mg by mouth daily. 01/20/21   [provider]  aspirin 325 MG tablet Take 325 mg by mouth daily.    [provider]  diazepam (DIASTAT) 2.5 MG GEL Place 2.5 mg rectally as needed for seizure. 04/16/22   Clydie Braun, MD  Lacosamide (VIMPAT) 100 MG TABS Take 1 tablet (100 mg total) by mouth 2 (two) times daily. 09/18/22   Windell Norfolk, MD  levETIRAcetam (KEPPRA) 500 MG tablet Take 3 tablets (1,500 mg total) by mouth 2 (two) times daily. 06/04/22 12/31/22  Windell Norfolk, MD  nicotine (NICODERM CQ - DOSED IN MG/24 HOURS) 14  mg/24hr patch Place 1 patch (14 mg total) onto the skin daily. 04/17/22   Clydie Braun, MD  sertraline (ZOLOFT) 100 MG tablet Take 1 tablet (100 mg total) by mouth daily. 06/04/22 07/04/22  Windell Norfolk, MD  SUMAtriptan (IMITREX) 50 MG tablet Take 1 tablet (50 mg total) by mouth every 2 (two) hours as needed for migraine. May repeat in 2 hours if headache persists or recurs. 06/04/22   Windell Norfolk, MD      Allergies    Patient has no known allergies.    Review of Systems   Review of Systems  Unable to perform ROS: Mental status change    Physical Exam Updated Vital Signs BP (!) 144/99   Pulse 96   Temp (!) 100.6 F (38.1 C) (Oral)   Resp 18   Ht 5\' 3"  (1.6 m)   Wt 62.1 kg   SpO2 95%   BMI 24.27 kg/m  Physical Exam Vitals and nursing note reviewed.  Constitutional:      General: She is not in acute distress.    Appearance: Normal appearance. She is ill-appearing.  HENT:     Head: Normocephalic.     Mouth/Throat:     Mouth: Mucous membranes are moist.  Eyes:     Pupils: Pupils are equal, round, and reactive to light.  Cardiovascular:     Rate and Rhythm: Normal rate.  Pulmonary:     Effort: Pulmonary  effort is normal. No respiratory distress.  Abdominal:     Palpations: Abdomen is soft.     Tenderness: There is no abdominal tenderness.  Musculoskeletal:     Cervical back: No rigidity.  Skin:    General: Skin is warm.  Neurological:     General: No focal deficit present.     Mental Status: She is alert. She is disoriented.  Psychiatric:        Mood and Affect: Mood normal.     ED Results / Procedures / Treatments   Labs (all labs ordered are listed, but only abnormal results are displayed) Labs Reviewed  URINALYSIS, ROUTINE W REFLEX MICROSCOPIC - Abnormal; Notable for the following components:      Result Value   Ketones, ur 5 (*)    Protein, ur 100 (*)    All other components within normal limits  CBC WITH DIFFERENTIAL/PLATELET  COMPREHENSIVE  METABOLIC PANEL    EKG None  Radiology DG Chest Port 1 View  Result Date: 10/02/2022 CLINICAL DATA:  Fever and grand mal seizure EXAM: PORTABLE CHEST 1 VIEW COMPARISON:  Radiographs 01/12/2020 FINDINGS: The heart size and mediastinal contours are stable. Both lungs are clear. The visualized skeletal structures are unremarkable. IMPRESSION: No active disease. Electronically Signed   By: Minerva Fester M.D.   On: 10/02/2022 19:42   CT Head Wo Contrast  Result Date: 10/02/2022 CLINICAL DATA:  Mental status change, unknown cause EXAM: CT HEAD WITHOUT CONTRAST TECHNIQUE: Contiguous axial images were obtained from the base of the skull through the vertex without intravenous contrast. RADIATION DOSE REDUCTION: This exam was performed according to the departmental dose-optimization program which includes automated exposure control, adjustment of the mA and/or kV according to patient size and/or use of iterative reconstruction technique. COMPARISON:  04/16/2022 FINDINGS: Brain: No acute intracranial abnormality. Specifically, no hemorrhage, hydrocephalus, mass lesion, acute infarction, or significant intracranial injury. Low-density in the left frontal lobe white matter, likely old infarct, unchanged. Vascular: No hyperdense vessel or unexpected calcification. Skull: No acute calvarial abnormality. Sinuses/Orbits: No acute findings Other: None IMPRESSION: No acute intracranial abnormality. Electronically Signed   By: Charlett Nose M.D.   On: 10/02/2022 19:40    Procedures Procedures    Medications Ordered in ED Medications  acetaminophen (TYLENOL) suppository 650 mg (has no administration in time range)  acetaminophen (TYLENOL) tablet 650 mg (650 mg Oral Given 10/02/22 2051)    ED Course/ Medical Decision Making/ A&P                             Medical Decision Making Amount and/or Complexity of Data Reviewed Labs: ordered. Radiology: ordered.  Risk OTC drugs. Prescription drug  management.   57 year old female presents emergency department with witnessed breakthrough grand mal seizure.  Husband at bedside is primary history giver as she is postictal.  Vitals are stable on arrival besides a low-grade fever, temp orally.  Patient is postictal but otherwise nonfocal on neuroexam.  Metabolic workup, EKG and CT of the head and chest x-ray are unremarkable.  Urinalysis shows no infection.  Patient was loaded with Keppra.  On reevaluation she has returned to baseline and has no complaints.  Consulted with neurologist Dr. Amada Jupiter given the low-grade fever in the setting of seizure.  Low suspicion for meningitis.  Patient is back to baseline.  He recommended further loading with Keppra and increasing her Vimpat from 100 mg twice daily to 150 mg twice daily.  Patient agrees with this and I will send new prescriptions.  Fever resolved. No other acute infectious findings on exam. Patient at this time appears safe and stable for discharge and close outpatient follow up. Discharge plan and strict return to ED precautions discussed, patient verbalizes understanding and agreement.        Final Clinical Impression(s) / ED Diagnoses Final diagnoses:  None    Rx / DC Orders ED Discharge Orders     None         Rozelle Logan, DO 10/02/22 2308

## 2022-10-02 NOTE — ED Notes (Signed)
   MD@bedside  at this time. No sepsis order at this tim. Will continue to monitor for any further s/sx.

## 2022-10-02 NOTE — Discharge Instructions (Addendum)
You have been seen and discharged from the emergency department.  Continue taking 1500 mg of Keppra twice a day.  Your Vimpat has been increased to 150 mg twice a day.  Take this new prescription as prescribed.  Follow-up with your primary neurologist provider for further evaluation and further care. Take home medications as prescribed. If you have any worsening symptoms or further concerns for your health please return to an emergency department for further evaluation.

## 2022-10-02 NOTE — ED Triage Notes (Signed)
Pt BIBA after having a 5 min grandmal seizure and has had a H/A for 3 days per her husband.Per EMS Pt has been post-dictal for 40 mins, responds to verbal stimuli,follow commands. Pt only A&O x2(Name/DOB) while triaging.

## 2022-10-02 NOTE — ED Notes (Signed)
Patient more alert than upon arrival and requests to take PO tylenol instead of rectal dose. Provider notified.

## 2022-10-02 NOTE — ED Triage Notes (Deleted)
[email or web address]  at this time. No sepsis order at this tim. Will continue to monitor for any further s/sx.

## 2022-10-14 ENCOUNTER — Other Ambulatory Visit: Payer: Self-pay | Admitting: Neurology

## 2022-10-14 ENCOUNTER — Telehealth: Payer: Self-pay | Admitting: Neurology

## 2022-10-14 MED ORDER — LACOSAMIDE 150 MG PO TABS: 150.0000 mg | ORAL_TABLET | Freq: Two times a day (BID) | ORAL | 2 refills | Status: DC

## 2022-10-14 NOTE — Telephone Encounter (Signed)
Pt's husband, Makya Grilliot said pt went to the ER 10/02/22 and they increased her  Lacosamide (VIMPAT) 100 MG TABS to 1 tablet and a half twice a day. She has only a half tablet left.Would like for Dr. Teresa Coombs to send in a refill to  [id number]  Would like a call back

## 2022-10-14 NOTE — Telephone Encounter (Signed)
Vimpat 150 mg BID sent to pharmacy.

## 2022-11-25 ENCOUNTER — Encounter (HOSPITAL_COMMUNITY): Payer: Self-pay

## 2022-11-25 ENCOUNTER — Emergency Department (HOSPITAL_COMMUNITY)
Admission: EM | Admit: 2022-11-25 | Discharge: 2022-11-26 | Disposition: A | Discharge status: Home / Self Care | Payer: Self-pay | Attending: Emergency Medicine | Admitting: Emergency Medicine

## 2022-11-25 ENCOUNTER — Emergency Department (HOSPITAL_COMMUNITY): Payer: Self-pay

## 2022-11-25 ENCOUNTER — Other Ambulatory Visit: Payer: Self-pay

## 2022-11-25 DIAGNOSIS — R569 Unspecified convulsions: Secondary | ICD-10-CM | POA: Insufficient documentation

## 2022-11-25 DIAGNOSIS — Z7982 Long term (current) use of aspirin: Secondary | ICD-10-CM | POA: Insufficient documentation

## 2022-11-25 DIAGNOSIS — Z79899 Other long term (current) drug therapy: Secondary | ICD-10-CM | POA: Insufficient documentation

## 2022-11-25 LAB — URINALYSIS, ROUTINE W REFLEX MICROSCOPIC
Bacteria, UA: NONE SEEN
Bilirubin Urine: NEGATIVE
Glucose, UA: NEGATIVE mg/dL
Hgb urine dipstick: NEGATIVE
Ketones, ur: 5 mg/dL — AB
Nitrite: NEGATIVE
Protein, ur: 100 mg/dL — AB
Specific Gravity, Urine: 1.013 (ref 1.005–1.030)
pH: 6 (ref 5.0–8.0)

## 2022-11-25 LAB — CBC WITH DIFFERENTIAL/PLATELET
Abs Immature Granulocytes: 0.02 10*3/uL (ref 0.00–0.07)
Basophils Absolute: 0 10*3/uL (ref 0.0–0.1)
Basophils Relative: 1 %
Eosinophils Absolute: 0 10*3/uL (ref 0.0–0.5)
Eosinophils Relative: 0 %
HCT: 38.7 % (ref 36.0–46.0)
Hemoglobin: 13.1 g/dL (ref 12.0–15.0)
Immature Granulocytes: 1 %
Lymphocytes Relative: 16 %
Lymphs Abs: 0.5 10*3/uL — ABNORMAL LOW (ref 0.7–4.0)
MCH: 29.6 pg (ref 26.0–34.0)
MCHC: 33.9 g/dL (ref 30.0–36.0)
MCV: 87.6 fL (ref 80.0–100.0)
Monocytes Absolute: 0.2 10*3/uL (ref 0.1–1.0)
Monocytes Relative: 7 %
Neutro Abs: 2.5 10*3/uL (ref 1.7–7.7)
Neutrophils Relative %: 75 %
Platelets: 210 10*3/uL (ref 150–400)
RBC: 4.42 MIL/uL (ref 3.87–5.11)
RDW: 13.6 % (ref 11.5–15.5)
WBC: 3.3 10*3/uL — ABNORMAL LOW (ref 4.0–10.5)
nRBC: 0 % (ref 0.0–0.2)

## 2022-11-25 LAB — COMPREHENSIVE METABOLIC PANEL
ALT: 22 U/L (ref 0–44)
AST: 38 U/L (ref 15–41)
Albumin: 3.9 g/dL (ref 3.5–5.0)
Alkaline Phosphatase: 59 U/L (ref 38–126)
Anion gap: 11 (ref 5–15)
BUN: 10 mg/dL (ref 6–20)
CO2: 22 mmol/L (ref 22–32)
Calcium: 8 mg/dL — ABNORMAL LOW (ref 8.9–10.3)
Chloride: 100 mmol/L (ref 98–111)
Creatinine, Ser: 0.64 mg/dL (ref 0.44–1.00)
GFR, Estimated: >60 mL/min (ref >60)
Glucose, Bld: 97 mg/dL (ref 70–99)
Potassium: 3.5 mmol/L (ref 3.5–5.1)
Sodium: 133 mmol/L — ABNORMAL LOW (ref 135–145)
Total Bilirubin: 0.8 mg/dL (ref 0.3–1.2)
Total Protein: 7.4 g/dL (ref 6.5–8.1)

## 2022-11-25 LAB — RAPID URINE DRUG SCREEN, HOSP PERFORMED
Amphetamines: NOT DETECTED
Barbiturates: NOT DETECTED
Benzodiazepines: POSITIVE — AB
Cocaine: NOT DETECTED
Opiates: NOT DETECTED
Tetrahydrocannabinol: NOT DETECTED

## 2022-11-25 LAB — ETHANOL: Alcohol, Ethyl (B): <10 mg/dL (ref <10)

## 2022-11-25 MED ORDER — SODIUM CHLORIDE 0.9 % IV BOLUS
500.0000 mL | Freq: Once | INTRAVENOUS | Status: AC
  Administered 2022-11-25: 500 mL via INTRAVENOUS

## 2022-11-25 MED ORDER — LEVETIRACETAM IN NACL 1500 MG/100ML IV SOLN
1500.0000 mg | Freq: Once | INTRAVENOUS | Status: AC
  Administered 2022-11-25: 1500 mg via INTRAVENOUS
  Filled 2022-11-25: qty 100

## 2022-11-25 MED ORDER — LEVETIRACETAM 500 MG PO TABS: 1500.0000 mg | ORAL_TABLET | Freq: Two times a day (BID) | ORAL | Status: DC

## 2022-11-25 MED ORDER — LACOSAMIDE 50 MG PO TABS
200.0000 mg | ORAL_TABLET | Freq: Two times a day (BID) | ORAL | Status: DC
  Administered 2022-11-25: 200 mg via ORAL
  Filled 2022-11-25: qty 4

## 2022-11-25 MED ORDER — ACETAMINOPHEN 325 MG PO TABS
650.0000 mg | ORAL_TABLET | Freq: Once | ORAL | Status: AC
  Administered 2022-11-25: 650 mg via ORAL
  Filled 2022-11-25: qty 2

## 2022-11-25 NOTE — ED Provider Notes (Signed)
[place] EMERGENCY DEPARTMENT AT [facility] Provider Note   CSN: [id number] Arrival date & time: 11/25/22  1916     History  Chief Complaint  Patient presents with   Seizures    Martha Schneider is a 57 y.o. female, history of epilepsy, who presents to the ED secondary to breakthrough seizure, that occurred at home.  Had another seizure on the way to the hospital by EMS.  Has been compliant with meds.  History is limited as patient is currently postictal.    Per husband, patient had a seizure today around 3 to 5 minutes, her body was shaking.  Seizing today, and did not urinate or defecate on herself and did not bite her tongue.  He called the ambulance.  She does drink 2 beers today, per husband, and has not missed any medications.  Per nursing, EMS states that she sees an ambulance as well, for an unknown amount of time.  Received Versed by EMS.   Home Medications Prior to Admission medications   Medication Sig Start Date End Date Taking? Authorizing Provider  amLODipine (NORVASC) 10 MG tablet Take 10 mg by mouth daily. 01/20/21   [provider]  aspirin 325 MG tablet Take 325 mg by mouth daily.    [provider]  diazepam (DIASTAT) 2.5 MG GEL Place 2.5 mg rectally as needed for seizure. 04/16/22   Clydie Braun, MD  Lacosamide 150 MG TABS Take 1 tablet (150 mg total) by mouth 2 (two) times daily. 10/14/22 07/11/23  Windell Norfolk, MD  levETIRAcetam (KEPPRA) 500 MG tablet Take 3 tablets (1,500 mg total) by mouth 2 (two) times daily. 10/02/22 04/30/23  Horton, Clabe Seal, DO  nicotine (NICODERM CQ - DOSED IN MG/24 HOURS) 14 mg/24hr patch Place 1 patch (14 mg total) onto the skin daily. 04/17/22   Clydie Braun, MD  Nicotine (NICODERM CQ TD) Place 1 patch onto the skin daily as needed (for smoking cessation while hospitalized).    [provider]  sertraline (ZOLOFT) 100 MG tablet Take 1 tablet (100 mg total) by mouth daily. 06/04/22 10/02/22  Windell Norfolk, MD  SUMAtriptan (IMITREX) 50 MG tablet Take 1 tablet (50 mg total) by mouth every 2 (two) hours as needed for migraine. May repeat in 2 hours if headache persists or recurs. Patient taking differently: Take 50 mg by mouth every 2 (two) hours as needed for migraine (and may repeat in 2 hours if headache persists or recurs). 06/04/22   Windell Norfolk, MD  UNKNOWN TO PATIENT Place 3 drops into both eyes See admin instructions. Unnamed eye drops (otc): Instill 3 drops into both eyes every morning- for irritation and/or allergies    [provider]      Allergies    Patient has no known allergies.    Review of Systems   Review of Systems  Respiratory:  Negative for shortness of breath.   Neurological:  Positive for seizures.    Physical Exam Updated Vital Signs BP (!) 147/88   Pulse 86   Temp 98 F (36.7 C)   Resp 19   SpO2 96%  Physical Exam Vitals and nursing note reviewed.  Constitutional:      General: She is not in acute distress.    Appearance: She is well-developed.     Comments: Postictal  HENT:     Head: Normocephalic and atraumatic.  Eyes:     Conjunctiva/sclera: Conjunctivae normal.  Cardiovascular:     Rate and Rhythm:  Normal rate and regular rhythm.     Heart sounds: No murmur heard. Pulmonary:     Effort: Pulmonary effort is normal. No respiratory distress.     Breath sounds: Normal breath sounds.  Abdominal:     Palpations: Abdomen is soft.     Tenderness: There is no abdominal tenderness.  Musculoskeletal:        General: No swelling.     Cervical back: Neck supple.  Skin:    General: Skin is warm and dry.     Capillary Refill: Capillary refill takes less than 2 seconds.  Neurological:     General: No focal deficit present.     Mental Status: She is lethargic.  Psychiatric:        Mood and Affect: Mood normal.     ED Results / Procedures / Treatments   Labs (all labs ordered are listed, but only abnormal results are displayed) Labs  Reviewed  CBC WITH DIFFERENTIAL/PLATELET - Abnormal; Notable for the following components:      Result Value   WBC 3.3 (*)    Lymphs Abs 0.5 (*)    All other components within normal limits  URINALYSIS, ROUTINE W REFLEX MICROSCOPIC - Abnormal; Notable for the following components:   Ketones, ur 5 (*)    Protein, ur 100 (*)    Leukocytes,Ua TRACE (*)    All other components within normal limits  COMPREHENSIVE METABOLIC PANEL - Abnormal; Notable for the following components:   Sodium 133 (*)    Calcium 8.0 (*)    All other components within normal limits  RAPID URINE DRUG SCREEN, HOSP PERFORMED - Abnormal; Notable for the following components:   Benzodiazepines POSITIVE (*)    All other components within normal limits  ETHANOL    EKG EKG Interpretation Date/Time:  Monday November 25 2022 19:28:26 EDT Ventricular Rate:  96 PR Interval:  165 QRS Duration:  101 QT Interval:  358 QTC Calculation: 453 R Axis:   -67  Text Interpretation: Sinus rhythm Inferior infarct, old Lateral leads are also involved Artifact in lead(s) I III aVR aVL aVF V2 Confirmed by Gloris Manchester [phone]) on 11/25/2022 10:54:30 PM  Radiology DG Chest 2 View  Result Date: 11/25/2022 CLINICAL DATA:  Seizure.  History of sarcoidosis. EXAM: CHEST - 2 VIEW COMPARISON:  None Available. FINDINGS: The heart size and mediastinal contours are stable. There is atherosclerotic calcification of the aorta. No consolidation, effusion, or pneumothorax. No acute osseous abnormality. IMPRESSION: No active cardiopulmonary disease. Electronically Signed   By: Thornell Sartorius M.D.   On: 11/25/2022 21:06    Procedures Procedures    Medications Ordered in ED Medications  lacosamide (VIMPAT) tablet 200 mg (200 mg Oral Given 11/25/22 2151)  levETIRAcetam (KEPPRA) tablet 1,500 mg (has no administration in time range)  levETIRAcetam (KEPPRA) IVPB 1500 mg/ 100 mL premix (0 mg Intravenous Stopped 11/25/22 2013)  sodium chloride 0.9 % bolus 500 mL  (0 mLs Intravenous Stopped 11/25/22 2153)  acetaminophen (TYLENOL) tablet 650 mg (650 mg Oral Given 11/25/22 2203)    ED Course/ Medical Decision Making/ A&P                             Medical Decision Making Patient is a 57 year old female, here for 2 seizures, has been compliant with her Vimpat, and Keppra.  Will give her a Keppra loading dose, as well as obtain labs, to evaluate for possible infectious etiology.  History is  limited given her postictal state.  Husband states she is postictal for about a day or so, and then perks up.  He has make sure she has gotten all of her medications, and she has not missed a dose.  She does drink 2 beers a day, but no more than per husband.  She did not hit her head as she was sitting down and had a seizure per husband.  Per Sal, increase Vimpat to 200mg  BID, make sure gets home meds and observe overnight to make sure she is waking up. If not waking up or somnolent still then call neuro again  Amount and/or Complexity of Data Reviewed Labs: ordered.    Details: No electrolyte deficiencies, no leukocytosis Radiology: ordered.    Details: Chest x-ray clear ECG/medicine tests:  Decision-making details documented in ED Course. Discussion of management or test interpretation with external provider(s): Discussed with neurologist on-call Dr.Khaliqdina, he recommends the patient Vimpat, to be increased to 200mg , continue Keppra, and observe the patient overnight, as the patient has a breakthrough seizure, or is very somnolent in the morning reconsult.  Discussed with Misty Stanley, [place], she will take over patient, and observe overnight.  Likely dispo in the morning unless there are breakthrough seizures or continued somnolence  Risk OTC drugs. Prescription drug management.   Final Clinical Impression(s) / ED Diagnoses Final diagnoses:  Seizure [facility])    Rx / DC Orders ED Discharge Orders     None         Kristapher [place], Harley Alto, PA 11/25/22 2349    Gloris Manchester, MD 11/26/22 [phone]

## 2022-11-25 NOTE — ED Notes (Signed)
Per XBJYN@ Main Lab- recollect for light green tube. RN advised. [employer]

## 2022-11-25 NOTE — Plan of Care (Signed)
Brief Neuro update:  2 breakthrough seizures, GTC. One at home and one by EMS. Got Keppra 1500 load in the ED. Urine is pending. Labs with no significant electrolyte abnormalities. She is compliant with her medications per discussion with ED team.  Plan: - obs overnight for seizure clustering. Can be discharged from the Ed in AM if no further seizures. - ED team ordering her PM dose of AEDs. She did not take her PM does prior to coming to the ED. - Increase Vimpat to 200mg  BID from her current home dose of 150mg  BID. EKG reviewe with normal PR interval. - continue Keppra at 1500 BID. - inpatient neurology team does not need to see her at this time but if she is not waking up or continues to have further breakthrough seizures, recommend reaching out to [place] again. - Follow up with her outpatient neurologist Dr. Teresa Coombs. - no driving for 6 months. This will need to be reiterated with her in AM when she is more awake prior to discharge. Full seizure precautions listed below. Please have these in the discahrge instructions/discharge summary.   ________________________________________________________________   Seizure precautions: Per [facility] statutes, patients with seizures are not allowed to drive until they have been seizure-free for six months and cleared by a physician    Use caution when using heavy equipment or power tools. Avoid working on ladders or at heights. Take showers instead of baths. Ensure the water temperature is not too high on the home water heater. Do not go swimming alone. Do not lock yourself in a room alone (i.e. bathroom). When caring for infants or small children, sit down when holding, feeding, or changing them to minimize risk of injury to the child in the event you have a seizure. Maintain good sleep hygiene. Avoid alcohol.    If patient has another seizure, call 911 and bring them back to the ED if: A.  The seizure lasts longer than 5 minutes.      B.  The  patient doesn't wake shortly after the seizure or has new problems such as difficulty seeing, speaking or moving following the seizure C.  The patient was injured during the seizure D.  The patient has a temperature over 102 F (39C) E.  The patient vomited during the seizure and now is having trouble breathing    During the Seizure   - First, ensure adequate ventilation and place patients on the floor on their left side  Loosen clothing around the neck and ensure the airway is patent. If the patient is clenching the teeth, do not force the mouth open with any object as this can cause severe damage - Remove all items from the surrounding that can be hazardous. The patient may be oblivious to what's happening and may not even know what he or she is doing. If the patient is confused and wandering, either gently guide him/her away and block access to outside areas - Reassure the individual and be comforting - Call 911. In most cases, the seizure ends before EMS arrives. However, there are cases when seizures may last over 3 to 5 minutes. Or the individual may have developed breathing difficulties or severe injuries. If a pregnant patient or a person with diabetes develops a seizure, it is prudent to call an ambulance. - Finally, if the patient does not regain full consciousness, then call EMS. Most patients will remain confused for about 45 to 90 minutes after a seizure, so you must use judgment  in calling for help. - Avoid restraints but make sure the patient is in a bed with padded side rails - Place the individual in a lateral position with the neck slightly flexed; this will help the saliva drain from the mouth and prevent the tongue from falling backward - Remove all nearby furniture and other hazards from the area - Provide verbal assurance as the individual is regaining consciousness - Provide the patient with privacy if possible - Call for help and start treatment as ordered by the caregiver     After the Seizure (Postictal Stage)   After a seizure, most patients experience confusion, fatigue, muscle pain and/or a headache. Thus, one should permit the individual to sleep. For the next few days, reassurance is essential. Being calm and helping reorient the person is also of importance.   Most seizures are painless and end spontaneously. Seizures are not harmful to others but can lead to complications such as stress on the lungs, brain and the heart. Individuals with prior lung problems may develop labored breathing and respiratory distress.

## 2022-11-25 NOTE — ED Triage Notes (Signed)
BIBA from home after having  2 seizures- one at home, one with EMS. 5mg  Versed given PTA. 124/85 110 99% 110 cbg

## 2022-11-26 MED ORDER — LACOSAMIDE 200 MG PO TABS: 200.0000 mg | ORAL_TABLET | Freq: Two times a day (BID) | ORAL | 0 refills | Status: DC

## 2022-11-26 MED ORDER — ACETAMINOPHEN 500 MG PO TABS
1000.0000 mg | ORAL_TABLET | Freq: Once | ORAL | Status: AC
  Administered 2022-11-26: 1000 mg via ORAL
  Filled 2022-11-26: qty 2

## 2022-11-26 NOTE — Discharge Instructions (Signed)
Our neurologist has recommended to increase your Vimpat to 200 mg twice daily so have written new prescription for this, continue your Keppra as prescribed. Please arrange outpatient follow-up with your neurologist. Return to the ED for new or worsening symptoms.

## 2022-11-26 NOTE — ED Provider Notes (Signed)
Assumed care at shift change.  See prior notes for full H&P.  Briefly, 57 y.o. F with hx of seizure disorder, presenting to the ED following x2 seizures.  Neurology recommended to increase vimpat and monitor overnight to ensure returning to baseline.  Plan:  given seizure meds, will observe overnight.  4:55 AM Patient has been observed overnight here, no further seizure activity.  She is AAOx3, able to carry on conversation now.  States still has a slight headache, additional medication given.  We have reviewed labs and CT as she did not recall going over this previously.  Also reviewed plan to increase vimpat to 200mg , continue keppra and OP follow-up with neurology.  She is agreeable.  Stable for discharge.  Can return here for any new/acute changes.   Garlon Hatchet, PA-C 11/26/22 0500    Glynn Octave, MD 11/26/22 [phone]

## 2022-11-26 NOTE — ED Notes (Signed)
Pt is waiting on her husband for discharge

## 2023-01-06 ENCOUNTER — Telehealth: Payer: Self-pay | Admitting: Neurology

## 2023-01-06 MED ORDER — LACOSAMIDE 200 MG PO TABS: 200.0000 mg | ORAL_TABLET | Freq: Two times a day (BID) | ORAL | 0 refills | Status: DC

## 2023-01-06 NOTE — Telephone Encounter (Signed)
Pt is requesting a refill for  lacosamide (VIMPAT) 200 MG TABS tablet.  Pharmacy:  [facility] [phone]

## 2023-01-06 NOTE — Telephone Encounter (Signed)
Done. Thanks.

## 2023-01-14 ENCOUNTER — Telehealth: Payer: Self-pay | Admitting: Neurology

## 2023-01-14 NOTE — Telephone Encounter (Signed)
Call to patient, she states that she has had 2 seizures since her last visit here, one in June and 1 in July, that they were both starring spells and her husband took her to the ER. She is currently taking Keppra 1500 mg BID and Vimpat 200 mg BID. She was given samples form the hospital and ran out last saturday and unable to get until husband gets paid Friday. Patient did not have follow up appointment and appointment was made and added to wait list. She is aware Dr. Teresa Coombs is out of the office but is checking messages. She is aware to reset my chart password and check frequently for cancellations.

## 2023-01-14 NOTE — Telephone Encounter (Signed)
Pt called wanting to speak to the RN regarding her last hospital visit due to a seizure.

## 2023-01-14 NOTE — Telephone Encounter (Signed)
Her not taking her medications is putting her at risk for a breakthrough seizure. I know we have samples of Briviact, she can take 100 mg twice daily, until she gets her Keppra. Please advise patient to go to UC or ED to have emergency meds given to her (at least 3 days supply).   Dr. Teresa Coombs

## 2023-01-14 NOTE — Telephone Encounter (Signed)
5:26pm Return call to patient, she states she does not want to go to an UC or ER tonight and ask for medications and verbalized risk of breakthrough seizures. I advised Per Dr. Teresa Coombs she can coe by office tomorrow for sample to bridge her until Friday. She is in agreement.

## 2023-01-15 ENCOUNTER — Other Ambulatory Visit: Payer: Self-pay

## 2023-01-15 MED ORDER — LACOSAMIDE 50 MG PO TABS: 200.0000 mg | ORAL_TABLET | Freq: Two times a day (BID) | ORAL | Status: DC

## 2023-01-15 NOTE — Telephone Encounter (Signed)
Sounds good. Thanks

## 2023-01-15 NOTE — Progress Notes (Signed)
Per verbal order and discussion with Margie Ege, NP. Vimpat samples given to bridge patient until husband gets paid on Friday.

## 2023-01-15 NOTE — Telephone Encounter (Signed)
2 boxes Vimpat 50 mg (28 tablets total) left at front for patient to pick up with instructions take 4 tablets in the morning (200 mg) and 4 tablets (200 mg) in the evening. She will have 4 left over tablets.

## 2023-01-29 ENCOUNTER — Other Ambulatory Visit: Payer: Self-pay | Admitting: Hematology and Oncology

## 2023-01-29 ENCOUNTER — Inpatient Hospital Stay: Payer: Self-pay | Attending: Hematology and Oncology

## 2023-01-29 ENCOUNTER — Inpatient Hospital Stay: Payer: Self-pay | Admitting: Hematology and Oncology

## 2023-01-29 DIAGNOSIS — D708 Other neutropenia: Secondary | ICD-10-CM

## 2023-01-29 NOTE — Progress Notes (Deleted)
[facility] [phone]   [phone]  PROGRESS NOTE  Patient Care Team: Darrow Bussing, MD as PCP - General (Family Medicine)  Hematological/Oncological History # Leukopenia, Neutropenia 1) 02/06/2007: WBC 3.0, Hgb 13.4, Plt 280. ANC 1400 2) 08/02/2013: WBC 2.0, Hgb 13.4, Plt 126 3) 12/17/2013: WBC 2.1, Hgb 12.5, Plt 216 4) 09/20/2019: WBC 2.1, Hgb 12.4, MCV 86, Plt 231, ANC 700 5) 10/19/2019: Establish care with Dr. Leonides Schanz. WBC 3.7, ANC 2.6 6) 05/10/2020: WBC 2.8, Hgb 13.6, MCV 88.6, Plt 234, ANC 1.5   Interval History:  Martha Schneider 57 y.o. female with medical history significant for neutropenia presents for a follow up visit. The patient's last visit was on 05/10/2020. In the interim since the last visit she is established with pulmonology for sarcoidosis.  On exam today Martha Schneider is accompanied by her husband.  She notes ***  Overall there have been no major changes in her health since her last visit.  A full 10 point ROS is listed below.  MEDICAL HISTORY:  Past Medical History:  Diagnosis Date   Depression    Hypertension    Sarcoidosis    Seizures (HCC)     SURGICAL HISTORY: No past surgical history on file.  SOCIAL HISTORY: Social History   Socioeconomic History   Marital status: Married    Spouse name: Not on file   Number of children: 2   Years of education: Not on file   Highest education level: Not on file  Occupational History   Not on file  Tobacco Use   Smoking status: Every Day    Current packs/day: 0.50    Average packs/day: 0.5 packs/day for 37.0 years (18.5 ttl pk-yrs)    Types: Cigarettes   Smokeless tobacco: Never  Vaping Use   Vaping status: Never Used  Substance and Sexual Activity   Alcohol use: Yes    Comment: occasionally   Drug use: No   Sexual activity: Not on file  Other Topics Concern   Not on file  Social History Narrative   Not on file   Social Determinants of Health   Financial Resource  Strain: Not on file  Food Insecurity: Not on file  Transportation Needs: Not on file  Physical Activity: Not on file  Stress: Not on file  Social Connections: Not on file  Intimate Partner Violence: Not on file    FAMILY HISTORY: Family History  Problem Relation Age of Onset   Heart attack Mother    Liver cancer Father    Diabetes Mellitus II Sister    Kidney disease Sister     ALLERGIES:  has No Known Allergies.  MEDICATIONS:  Current Outpatient Medications  Medication Sig Dispense Refill   amLODipine (NORVASC) 10 MG tablet Take 10 mg by mouth daily.     aspirin 325 MG tablet Take 325 mg by mouth daily.     diazepam (DIASTAT) 2.5 MG GEL Place 2.5 mg rectally as needed for seizure. 1 kit 1   lacosamide (VIMPAT) 200 MG TABS tablet Take 1 tablet (200 mg total) by mouth 2 (two) times daily. 60 tablet 0   lacosamide (VIMPAT) 50 MG TABS tablet Take 4 tablets (200 mg total) by mouth 2 (two) times daily.     levETIRAcetam (KEPPRA) 500 MG tablet Take 3 tablets (1,500 mg total) by mouth 2 (two) times daily. 180 tablet 6   nicotine (NICODERM CQ - DOSED IN MG/24 HOURS) 14 mg/24hr patch Place 1 patch (14 mg total) onto  the skin daily. 28 patch 0   Nicotine (NICODERM CQ TD) Place 1 patch onto the skin daily as needed (for smoking cessation while hospitalized).     sertraline (ZOLOFT) 100 MG tablet Take 1 tablet (100 mg total) by mouth daily. 30 tablet 0   SUMAtriptan (IMITREX) 50 MG tablet Take 1 tablet (50 mg total) by mouth every 2 (two) hours as needed for migraine. May repeat in 2 hours if headache persists or recurs. (Patient taking differently: Take 50 mg by mouth every 2 (two) hours as needed for migraine (and may repeat in 2 hours if headache persists or recurs).) 10 tablet 6   UNKNOWN TO PATIENT Place 3 drops into both eyes See admin instructions. Unnamed eye drops (otc): Instill 3 drops into both eyes every morning- for irritation and/or allergies     No current facility-administered  medications for this visit.    REVIEW OF SYSTEMS:   Constitutional: ( - ) fevers, ( - )  chills , ( - ) night sweats Eyes: ( - ) blurriness of vision, ( - ) double vision, ( - ) watery eyes Ears, nose, mouth, throat, and face: ( - ) mucositis, ( - ) sore throat Respiratory: ( - ) cough, ( - ) dyspnea, ( - ) wheezes Cardiovascular: ( - ) palpitation, ( - ) chest discomfort, ( - ) lower extremity swelling Gastrointestinal:  ( - ) nausea, ( - ) heartburn, ( - ) change in bowel habits Skin: ( - ) abnormal skin rashes Lymphatics: ( - ) new lymphadenopathy, ( - ) easy bruising Neurological: ( - ) numbness, ( - ) tingling, ( - ) new weaknesses Behavioral/Psych: ( - ) mood change, ( - ) new changes  All other systems were reviewed with the patient and are negative.  PHYSICAL EXAMINATION: ECOG PERFORMANCE STATUS: 0 - Asymptomatic  There were no vitals filed for this visit.  There were no vitals filed for this visit.   GENERAL: well appearing middle aged [place] American female. alert, no distress and comfortable SKIN: skin color, texture, turgor are normal, no rashes or significant lesions EYES: conjunctiva are pink and non-injected, sclera clear LUNGS: clear to auscultation and percussion with normal breathing effort HEART: regular rate & rhythm and no murmurs and no lower extremity edema Musculoskeletal: no cyanosis of digits and no clubbing  PSYCH: alert & oriented x 3, fluent speech NEURO: no focal motor/sensory deficits  LABORATORY DATA:  I have reviewed the data as listed    Latest Ref Rng & Units 11/25/2022    8:00 PM 10/02/2022    8:05 PM 04/16/2022    1:59 AM  CBC  WBC 4.0 - 10.5 K/uL 3.3  4.0  4.9   Hemoglobin 12.0 - 15.0 g/dL 16.1  09.6  04.5   Hematocrit 36.0 - 46.0 % 38.7  40.7  44.3   Platelets 150 - 400 K/uL 210  247  200        Latest Ref Rng & Units 11/25/2022    8:46 PM 10/02/2022    8:05 PM 04/16/2022    1:59 AM  CMP  Glucose 70 - 99 mg/dL 97  93  409   BUN 6  - 20 mg/dL 10  11  9    Creatinine 0.44 - 1.00 mg/dL 8.11  9.14  7.82   Sodium 135 - 145 mmol/L 133  132  137   Potassium 3.5 - 5.1 mmol/L 3.5  4.5  4.1   Chloride 98 - 111  mmol/L 100  98  102   CO2 22 - 32 mmol/L 22  21  18    Calcium 8.9 - 10.3 mg/dL 8.0  8.4  9.4   Total Protein 6.5 - 8.1 g/dL 7.4  8.2  8.2   Total Bilirubin 0.3 - 1.2 mg/dL 0.8  1.0  0.7   Alkaline Phos 38 - 126 U/L 59  67  68   AST 15 - 41 U/L 38  49  68   ALT 0 - 44 U/L 22  20  28      RADIOGRAPHIC STUDIES: No results found.  ASSESSMENT & PLAN Martha Schneider 57 y.o. female with medical history significant for neutropenia presents for a follow up visit.   # Leukopenia, Neutropenia --today will order repeat CBC, CMP, LDH, and peripheral blood smear --nutritional evaluation with vitamin b12, folate, MMA, and homocysteine showed no deficiencies.  --viral workup with Hep B, Hep C, and HIV was also negative -- if worsening leukopenia is noted may need to consider bone marrow biopsy to further evaluate. It is possible there is Bone marrow involvement of her sarcoidosis --RTC PRN. Recommend re-referral in the event the ANC consistently <1000 or if other abnormalities were to develop.    #Pulmonary Sarcoidosis --patient has received no treatment since she moved to [facility]. She took steroids "years" ago --no currently having shortness of breath or cough -- will order CT chest to assess status o sarcoid and r/o lung malignancy given smoking history and recent history of weight loss.  --can refer to pulmonary if sarcoidosis appears active.   No orders of the defined types were placed in this encounter.   All questions were answered. The patient knows to call the clinic with any problems, questions or concerns.  A total of more than 30 minutes were spent on this encounter and over half of that time was spent on counseling and coordination of care as outlined above.   Ulysees Barns, MD Department of Hematology/Oncology [facility] at [facility] Phone: [phone] Pager: [phone] Email: [email or web address]  01/29/2023 7:35 AM

## 2023-02-18 ENCOUNTER — Telehealth: Payer: Self-pay | Admitting: Neurology

## 2023-02-18 MED ORDER — LACOSAMIDE 200 MG PO TABS: 200.0000 mg | ORAL_TABLET | Freq: Two times a day (BID) | ORAL | 3 refills | Status: DC

## 2023-02-18 NOTE — Telephone Encounter (Signed)
Pt request refill for lacosamide (VIMPAT) 200 MG TABS tablet send to  [facility] [phone]

## 2023-02-18 NOTE — Telephone Encounter (Signed)
  Patient was last bridged with a sample of lacosamide 50 mg tablets on 01/15/2023.  Last office visit was on 09/18/2022 Next office visit on 09/10/2023

## 2023-02-19 ENCOUNTER — Other Ambulatory Visit: Payer: Self-pay

## 2023-02-19 NOTE — Telephone Encounter (Signed)
Error

## 2023-02-20 DIAGNOSIS — Z0289 Encounter for other administrative examinations: Secondary | ICD-10-CM

## 2023-02-20 MED ORDER — LACOSAMIDE 200 MG PO TABS: 200.0000 mg | ORAL_TABLET | Freq: Two times a day (BID) | ORAL | 3 refills | Status: DC

## 2023-02-20 NOTE — Telephone Encounter (Signed)
I received FMLA paperwork for patient, no visit since beginning of year. Are you agreeable to filling?

## 2023-03-11 ENCOUNTER — Other Ambulatory Visit: Payer: Self-pay | Admitting: Family Medicine

## 2023-03-11 DIAGNOSIS — Z0000 Encounter for general adult medical examination without abnormal findings: Secondary | ICD-10-CM

## 2023-03-12 NOTE — Telephone Encounter (Signed)
Patient FMLA completed per last OV notes and placed in MD office for review and Signature.

## 2023-03-17 ENCOUNTER — Telehealth: Payer: Self-pay | Admitting: *Deleted

## 2023-03-17 NOTE — Telephone Encounter (Signed)
Pt [employer] form faxed on 03/17/2023

## 2023-03-18 ENCOUNTER — Telehealth: Payer: Self-pay | Admitting: Neurology

## 2023-03-18 NOTE — Telephone Encounter (Signed)
Pt called stating that her husband has found her several times on the floor and they are thinking she is having seizures without realizing. She states last night was the last time it happened. Please advise.

## 2023-03-18 NOTE — Telephone Encounter (Signed)
Attempted to call patient, VM full and unable to LVM

## 2023-03-19 NOTE — Telephone Encounter (Signed)
Attempted to call patient, VM full and unable to LVM

## 2023-03-20 NOTE — Telephone Encounter (Signed)
Called and lvm for return call. Third attempt. Closing this encounter. Will wait for CB at this time

## 2023-03-24 NOTE — Telephone Encounter (Signed)
New paperwork completed and placed in MD office for review and signature

## 2023-03-25 ENCOUNTER — Telehealth: Payer: Self-pay

## 2023-03-25 NOTE — Telephone Encounter (Signed)
FMLA papers signed and given to medical records

## 2023-03-26 ENCOUNTER — Other Ambulatory Visit: Payer: Self-pay | Admitting: Neurology

## 2023-03-26 MED ORDER — CLOBAZAM 10 MG PO TABS: 10.0000 mg | ORAL_TABLET | Freq: Every evening | ORAL | 5 refills | Status: DC

## 2023-03-26 NOTE — Telephone Encounter (Signed)
Patient returned call 03/18/23 and states that her husband has found her several times in the floor in the mornings, and having lost control of bladder. She complaints of headaches but the migraine medication is helping. She is not sure if she is having seizures or why she is ending in the floor. The last episode was this past week. She has not been to the ER because she says she doesn't have insurance and she has not missed any medication doses. She states she has enough medication.

## 2023-03-26 NOTE — Telephone Encounter (Signed)
These are likely seizure, she is on higher dose of Vimpat and Keppra, I will add Clobazam 10 mg nightly. At our next visit, we will discuss about VNS. Please add patient to the cancellation list.

## 2023-03-31 ENCOUNTER — Telehealth: Payer: Self-pay | Admitting: *Deleted

## 2023-03-31 NOTE — Telephone Encounter (Signed)
Call to patient, she states she has picked up the clobazem and has been taking it. She is aware to check mychart for cancellation notifications and call us with any new symptoms or seizures.

## 2023-03-31 NOTE — Telephone Encounter (Signed)
Pt [employer] form faxed on 03/31/23 pt copy @ the front desk

## 2023-05-20 ENCOUNTER — Telehealth: Payer: Self-pay | Admitting: Neurology

## 2023-05-20 NOTE — Telephone Encounter (Signed)
Pt stares she is getting real bad migraines on one side of her head that medication doesn't seem to really be helping. Also states she has fell a few times because her legs have been giving out on her. Requesting call back

## 2023-05-20 NOTE — Telephone Encounter (Signed)
Call to patient, no answer. Left message to return call.

## 2023-07-08 ENCOUNTER — Telehealth: Payer: Self-pay | Admitting: Neurology

## 2023-07-08 NOTE — Telephone Encounter (Signed)
 Returned call to patient, she reports frequent falls but denies any seizures. She reports medication compliance with seizure medications. Advised to follow up with PCP regarding weakness and falls. She is in agreement

## 2023-07-08 NOTE — Telephone Encounter (Signed)
 Pt called wanting to know if a nurse can call her to discuss frequent falls and her medication for her seizures. Please advise.

## 2023-07-21 ENCOUNTER — Ambulatory Visit: Payer: Self-pay | Admitting: Neurology

## 2023-08-08 ENCOUNTER — Other Ambulatory Visit: Payer: Self-pay | Admitting: Neurology

## 2023-08-08 NOTE — Telephone Encounter (Signed)
 Requested Prescriptions   Pending Prescriptions Disp Refills   lacosamide (VIMPAT) 200 MG TABS tablet [Pharmacy Med Name: LACOSAMIDE 200 MG TABLET] 180 tablet 3    Sig: TAKE 1 TABLET BY MOUTH TWICE A DAY   SUMAtriptan (IMITREX) 50 MG tablet [Pharmacy Med Name: SUMATRIPTAN SUCC 50 MG TABLET] 10 tablet 6    Sig: TAKE 1 TAB EVERY 2 HRS AS NEEDED FOR MIGRAINE. MAY REPEAT IN 2 HOURS IF HEADACHE PERSISTS OR RECURS.   Last seen 09/18/22, next appt 09/10/23  Dispenses   Dispensed Days Supply Quantity Provider Pharmacy  LACOSAMIDE 200 MG TABLET 07/08/2023 30 60 each Windell Norfolk, MD [facility] [phone] - [facility]...  LACOSAMIDE 200 MG TABLET 05/23/2023 45 90 each Windell Norfolk, MD [facility] [phone] - G...  LACOSAMIDE 200 MG TABLET 02/20/2023 90 180 each Windell Norfolk, MD [facility] [phone] - G...  LACOSAMIDE 200 MG TABLET 01/07/2023 30 60 each Windell Norfolk, MD [facility] [phone] - G...  LACOSAMIDE 200 MG TABLET 11/26/2022 30 60 each Garlon Hatchet, PA-C [facility] [phone] - G...  LACOSAMIDE 100 MG TABLET 10/15/2022 30 90 each Horton, Clabe Seal, DO [facility] [phone] - G...  LACOSAMIDE 100 MG TABLET 09/18/2022 30 60 each Windell Norfolk, MD [facility] [phone] - G...  LACOSAMIDE 100 MG TABLET 09/01/2022 15 30 each Windell Norfolk, MD [facility] [phone] - G...  LACOSAMIDE 100 MG TABLET 08/17/2022 15 30 each Windell Norfolk, MD [facility] [phone] - G.Marland KitchenMarland Kitchen

## 2023-08-18 ENCOUNTER — Telehealth: Payer: Self-pay | Admitting: Neurology

## 2023-08-18 NOTE — Telephone Encounter (Signed)
 Rya Morgan(sister not on DPR) has called to report her concerns for pt the she observed from over the weekend.  Pt's sister is asking that a RN call's pt's spouse(which is on DPR) Pt's sister reports that she observed pt taking 9 keppra  pills in a day along with other medications, she said pt slept all day, very much not herself, requiring her son(s) to carry her from room to room.  Pt's sister is asking that a RN calls pt's spouse to discuss pt's medications and to ensure she is properly taking meds, and to also have PT  in home arranged for pt, please advise.

## 2023-08-18 NOTE — Telephone Encounter (Signed)
 Husband returned call. We reviewed medications and he verbalized understanding. He states patient is sleeping a lot. Husband agrees to bring patient in for appointment tomorrow and understands lab work will be needed.

## 2023-08-18 NOTE — Telephone Encounter (Signed)
 Call to husband to address sisters concerns, no answer, unable to leave voicemail, states mailbox is full.  Call to patient, left voicemail asking to return call.  Returned call to sister, advised her I reached out and no answer. She stated she would text them as well.

## 2023-08-19 ENCOUNTER — Encounter: Payer: Self-pay | Admitting: Neurology

## 2023-08-19 ENCOUNTER — Ambulatory Visit (INDEPENDENT_AMBULATORY_CARE_PROVIDER_SITE_OTHER): Admitting: Neurology

## 2023-08-19 VITALS — BP 144/91 | HR 82 | Ht 63.0 in | Wt 153.5 lb

## 2023-08-19 DIAGNOSIS — W19XXXD Unspecified fall, subsequent encounter: Secondary | ICD-10-CM

## 2023-08-19 DIAGNOSIS — G40909 Epilepsy, unspecified, not intractable, without status epilepticus: Secondary | ICD-10-CM | POA: Diagnosis not present

## 2023-08-19 MED ORDER — LEVETIRACETAM 500 MG PO TABS: 1000.0000 mg | ORAL_TABLET | Freq: Two times a day (BID) | ORAL | 0 refills | Status: DC

## 2023-08-19 NOTE — Patient Instructions (Signed)
 Decrease Keppra  to 1000 mg twice daily  Decrease Clobazam  to 5 mg nightly  Continue with Lacosamide  200 mg twice daily  Will obtain a routine EEG  Referral to physical therapy for strength training and gait abnormality  Please call us  if she does have another seizure  Return in 6 months or sooner if worse

## 2023-08-19 NOTE — Progress Notes (Signed)
 [facility]  PATIENT: Martha Schneider DOB: Feb 16, 1966  REQUESTING CLINICIAN: Koirala, Dibas, MD HISTORY FROM: Patient and husband  REASON FOR VISIT: Seizure disorder    HISTORICAL  CHIEF COMPLAINT:  Chief Complaint  Patient presents with   Follow-up    Rm13, husband present, Family thinks the patient may need some doses reduced because all she does is sleep and the husband has to take of her and would like fmla completed by Dr. Samara Crest,  until pt can be in an ok spot to be home alone. Pt reported frequent falls (falls screening completed) last fall 08/16/23 and would like some imaging done. Last sz was 76yr ago. But since falls asleep, husband doesn't let her drive. Memory concerns forgets husband name on occasion. Would like rollator for mobility    INTERVAL HISTORY 08/19/2023:  Patient presents today for follow-up, last visit was in May at that time we continued her on Vimpat  and Keppra  but she continued to have breakthrough seizures.  Clobazam  was added, since the addition of clobazam , she tells me that she is feeling weak, sometimes will have dizzy spell.  Husband has also found her on the floor not sure if she did have a seizure or not.  She reports compliance with the medication.    INTERVAL HISTORY 09/18/2022:  Patient presents today for follow-up, she is accompanied by her husband.  Last visit was in February.  Since then she has not had any seizure or seizure-like activity.  She is compliant with the Keppra  and lacosamide .  Denies any side effect from the medication. In terms of the headaches, she reports 2 headaches per month.  Right-sided headache, on the temporal area, described it as aching pain.  Reports that sumatriptan  helps relieve the headaches but will make her drowsy and sleepy.  No other concerns or complaints.   HISTORY OF PRESENT ILLNESS:  This is a 58 year old woman past medical history of hypertension, sarcoidosis, seizure disorder who is presenting  to establish care.  Patient reports her first seizure was in December 2022. She was in her car taking a break and the next thing that she knows is waking up in the hospital.  She was told that she had a seizure while in the car. Her workup at that time showed a normal brain imaging but her EEG was abnormal. She was started on Keppra .  She was on Keppra , doing well and in December 2023, had a breakthrough seizure.  Both husband and son found her in on the floor in her bedroom unresponsive.  She was taken to the hospital, observed for another day.  On discharge, Vimpat  was added.  Currently she is on Keppra  1500 mg twice daily and Vimpat  100 mg twice daily, denies any side effect from the medication.  Her main complaint right now is headaches, patient was previously on Zoloft  but since running out of medication she has not refilled it.   Handedness: Right handed   Onset: December 2022   Seizure Type: Convulsion   Current frequency: Total of 2 or possible 3 seizures   Any injuries from seizures: Denies   Seizure risk factors:  Denies   Previous ASMs: Levetiracetam , Lacosamide , Clobazam    Currenty ASMs: Levetiracetam  1500 mg twice daily, Lacosamide  200 mg twice daily   ASMs side effects: Denies   Brain Images: No acute findings   Previous EEGs: Focal motor seizure seen on EEG, L LPD +R   OTHER MEDICAL CONDITIONS: Seizure disorder, Hypertension, Depression   REVIEW OF  SYSTEMS: Full 14 system review of systems performed and negative with exception of: As noted in the HPI   ALLERGIES: No Known Allergies  HOME MEDICATIONS: Outpatient Medications Prior to Visit  Medication Sig Dispense Refill   amLODipine  (NORVASC ) 10 MG tablet Take 10 mg by mouth daily.     aspirin 325 MG tablet Take 325 mg by mouth daily.     cloBAZam  (ONFI ) 10 MG tablet Take 1 tablet (10 mg total) by mouth at bedtime. 30 tablet 5   diazepam  (DIASTAT ) 2.5 MG GEL Place 2.5 mg rectally as needed for seizure. 1 kit 1    lacosamide  (VIMPAT ) 200 MG TABS tablet TAKE 1 TABLET BY MOUTH TWICE A DAY 180 tablet 3   nicotine  (NICODERM CQ  - DOSED IN MG/24 HOURS) 14 mg/24hr patch Place 1 patch (14 mg total) onto the skin daily. 28 patch 0   Nicotine  (NICODERM CQ  TD) Place 1 patch onto the skin daily as needed (for smoking cessation while hospitalized).     sertraline  (ZOLOFT ) 100 MG tablet Take 1 tablet (100 mg total) by mouth daily. 30 tablet 0   SUMAtriptan  (IMITREX ) 50 MG tablet TAKE 1 TAB EVERY 2 HRS AS NEEDED FOR MIGRAINE. MAY REPEAT IN 2 HOURS IF HEADACHE PERSISTS OR RECURS. 10 tablet 6   UNKNOWN TO PATIENT Place 3 drops into both eyes See admin instructions. Unnamed eye drops (otc): Instill 3 drops into both eyes every morning- for irritation and/or allergies     levETIRAcetam  (KEPPRA ) 500 MG tablet Take 3 tablets (1,500 mg total) by mouth 2 (two) times daily. 180 tablet 6   No facility-administered medications prior to visit.    PAST MEDICAL HISTORY: Past Medical History:  Diagnosis Date   Depression    Hypertension    Sarcoidosis    Seizures (HCC)     PAST SURGICAL HISTORY: History reviewed. No pertinent surgical history.  FAMILY HISTORY: Family History  Problem Relation Age of Onset   Heart attack Mother    Liver cancer Father    Diabetes Mellitus II Sister    Kidney disease Sister     SOCIAL HISTORY: Social History   Socioeconomic History   Marital status: Married    Spouse name: Not on file   Number of children: 2   Years of education: Not on file   Highest education level: Not on file  Occupational History   Not on file  Tobacco Use   Smoking status: Every Day    Current packs/day: 0.50    Average packs/day: 0.5 packs/day for 37.0 years (18.5 ttl pk-yrs)    Types: Cigarettes   Smokeless tobacco: Never  Vaping Use   Vaping status: Never Used  Substance and Sexual Activity   Alcohol use: Yes    Comment: occasionally   Drug use: No   Sexual activity: Not on file  Other Topics  Concern   Not on file  Social History Narrative   Not on file   Social Drivers of Health   Financial Resource Strain: Not on file  Food Insecurity: Not on file  Transportation Needs: Not on file  Physical Activity: Not on file  Stress: Not on file  Social Connections: Not on file  Intimate Partner Violence: Not on file    PHYSICAL EXAM  GENERAL EXAM/CONSTITUTIONAL: Vitals:  Vitals:   08/19/23 1136 08/19/23 1140  BP: 138/89 (!) 144/91  Pulse:  82  Weight:  153 lb 8 oz (69.6 kg)  Height:  5\' 3"  (1.6 m)  Body mass index is 27.19 kg/m. Wt Readings from Last 3 Encounters:  08/19/23 153 lb 8 oz (69.6 kg)  10/02/22 137 lb (62.1 kg)  09/18/22 139 lb 8 oz (63.3 kg)   Patient is in no distress; well developed, nourished and groomed; neck is supple  MUSCULOSKELETAL: Gait, strength, tone, movements noted in Neurologic exam below  NEUROLOGIC: MENTAL STATUS:      No data to display         awake, alert, oriented to person, place and time recent and remote memory intact normal attention and concentration language fluent, comprehension intact, naming intact fund of knowledge appropriate  CRANIAL NERVE:  2nd, 3rd, 4th, 6th - Visual fields full to confrontation, extraocular muscles intact, no nystagmus 5th - facial sensation symmetric 7th - facial strength symmetric 8th - hearing intact 9th - palate elevates symmetrically, uvula midline 11th - shoulder shrug symmetric 12th - tongue protrusion midline  MOTOR:  normal bulk and tone, full strength in the BUE, BLE  SENSORY:  normal and symmetric to light touch  COORDINATION:  finger-nose-finger, fine finger movements normal  GAIT/STATION:  Needs husband assistance    DIAGNOSTIC DATA (LABS, IMAGING, TESTING) - I reviewed patient records, labs, notes, testing and imaging myself where available.  Lab Results  Component Value Date   WBC 3.3 (L) 11/25/2022   HGB 13.1 11/25/2022   HCT 38.7 11/25/2022   MCV  87.6 11/25/2022   PLT 210 11/25/2022      Component Value Date/Time   NA 133 (L) 11/25/2022 2046   K 3.5 11/25/2022 2046   CL 100 11/25/2022 2046   CO2 22 11/25/2022 2046   GLUCOSE 97 11/25/2022 2046   BUN 10 11/25/2022 2046   CREATININE 0.64 11/25/2022 2046   CREATININE 0.78 05/10/2020 1037   CREATININE 0.76 12/17/2013 1653   CALCIUM  8.0 (L) 11/25/2022 2046   PROT 7.4 11/25/2022 2046   ALBUMIN 3.9 11/25/2022 2046   AST 38 11/25/2022 2046   AST 26 05/10/2020 1037   ALT 22 11/25/2022 2046   ALT 14 05/10/2020 1037   ALKPHOS 59 11/25/2022 2046   BILITOT 0.8 11/25/2022 2046   BILITOT 0.6 05/10/2020 1037   GFRNONAA >60 11/25/2022 2046   GFRNONAA >60 05/10/2020 1037   GFRNONAA >89 12/17/2013 1653   GFRAA >60 10/20/2019 1028   GFRAA >89 12/17/2013 1653   Lab Results  Component Value Date   CHOL 162 02/06/2007   HDL 61 02/06/2007   LDLCALC 81 02/06/2007   TRIG 100 02/06/2007   Lab Results  Component Value Date   HGBA1C 5.7 (H) 12/17/2013   Lab Results  Component Value Date   VITAMINB12 1,267 (H) 10/20/2019   Lab Results  Component Value Date   TSH 3.905 04/08/2021    LTM 04/09/2021 -Focal seizure, left frontotemporal region -Brief ictal-interictal rhythmic discharges, left hemisphere - Lateralized periodic discharges with superimposed rhythmicity ( LPD +R) left hemisphere - Continuous slow, generalized   MRI Brain 04/10/2021 Low level restricted diffusion, increased T2 signal and increased FLAIR signal within the left medial thalamus, left cingulate gyrus, anterior left frontal lobe, and left insula. These findings are felt most likely to be postictal phenomenon rather than ischemic infarctions   ASSESSMENT AND PLAN  58 y.o. year old female  with hypertension, sarcoid, seizure disorder who is presenting for follow up.  Had a couple breakthrough seizures since last visit, clobazam  was added but she experienced side effect of dizziness, gait abnormality.  Will  decrease Keppra  to 1000  mg twice daily, decrease clobazam  to 5 mg nightly and continue patient on Vimpat  200 mg twice daily.  I will see her in 6 months for follow-up.  Will also obtain routine EEG.  Advised patient to contact me if does have another seizure. For her balance issue, will refer her to physical therapy.     1. Nonintractable epilepsy without status epilepticus, unspecified epilepsy type (HCC)   2. Fall, subsequent encounter      Patient Instructions  Decrease Keppra  to 1000 mg twice daily  Decrease Clobazam  to 5 mg nightly  Continue with Lacosamide  200 mg twice daily  Will obtain a routine EEG  Referral to physical therapy for strength training and gait abnormality  Please call us  if she does have another seizure  Return in 6 months or sooner if worse    Per [place]  DMV statutes, patients with seizures are not allowed to drive until they have been seizure-free for six months.  Other recommendations include using caution when using heavy equipment or power tools. Avoid working on ladders or at heights. Take showers instead of baths.  Do not swim alone.  Ensure the water temperature is not too high on the home water heater. Do not go swimming alone. Do not lock yourself in a room alone (i.e. bathroom). When caring for infants or small children, sit down when holding, feeding, or changing them to minimize risk of injury to the child in the event you have a seizure. Maintain good sleep hygiene. Avoid alcohol.  Also recommend adequate sleep, hydration, good diet and minimize stress.   During the Seizure  - First, ensure adequate ventilation and place patients on the floor on their left side  Loosen clothing around the neck and ensure the airway is patent. If the patient is clenching the teeth, do not force the mouth open with any object as this can cause severe damage - Remove all items from the surrounding that can be hazardous. The patient may be oblivious to what's  happening and may not even know what he or she is doing. If the patient is confused and wandering, either gently guide him/her away and block access to outside areas - Reassure the individual and be comforting - Call 911. In most cases, the seizure ends before EMS arrives. However, there are cases when seizures may last over 3 to 5 minutes. Or the individual may have developed breathing difficulties or severe injuries. If a pregnant patient or a person with diabetes develops a seizure, it is prudent to call an ambulance. - Finally, if the patient does not regain full consciousness, then call EMS. Most patients will remain confused for about 45 to 90 minutes after a seizure, so you must use judgment in calling for help. - Avoid restraints but make sure the patient is in a bed with padded side rails - Place the individual in a lateral position with the neck slightly flexed; this will help the saliva drain from the mouth and prevent the tongue from falling backward - Remove all nearby furniture and other hazards from the area - Provide verbal assurance as the individual is regaining consciousness - Provide the patient with privacy if possible - Call for help and start treatment as ordered by the caregiver   After the Seizure (Postictal Stage)  After a seizure, most patients experience confusion, fatigue, muscle pain and/or a headache. Thus, one should permit the individual to sleep. For the next few days, reassurance is essential. Being calm and helping  reorient the person is also of importance.  Most seizures are painless and end spontaneously. Seizures are not harmful to others but can lead to complications such as stress on the lungs, brain and the heart. Individuals with prior lung problems may develop labored breathing and respiratory distress.     Orders Placed This Encounter  Procedures   Ambulatory referral to Physical Therapy   EEG adult    Meds ordered this encounter  Medications    levETIRAcetam  (KEPPRA ) 500 MG tablet    Sig: Take 2 tablets (1,000 mg total) by mouth 2 (two) times daily.    Dispense:  360 tablet    Refill:  0    Return in about 6 months (around 02/18/2024).    Cassandra Cleveland, MD 08/19/2023, 5:06 PM  [facility] [address] [place], [place] [address] [phone]

## 2023-08-20 ENCOUNTER — Telehealth: Payer: Self-pay | Admitting: Neurology

## 2023-08-20 NOTE — Telephone Encounter (Signed)
 Referral for physical therapy fax to [facility]. Phone: [phone], Fax: [phone]

## 2023-08-22 ENCOUNTER — Telehealth: Payer: Self-pay | Admitting: Neurology

## 2023-08-22 NOTE — Telephone Encounter (Signed)
 Oley Berth dropped off FMLA paperwork and Jury paperwork for patient. Placed in Dr. Karlene Overcast box

## 2023-08-25 ENCOUNTER — Ambulatory Visit: Admitting: Neurology

## 2023-08-25 DIAGNOSIS — G40909 Epilepsy, unspecified, not intractable, without status epilepticus: Secondary | ICD-10-CM

## 2023-08-25 DIAGNOSIS — Z0289 Encounter for other administrative examinations: Secondary | ICD-10-CM

## 2023-08-25 NOTE — Procedures (Signed)
    History:  58 year old woman with epilepsy   EEG classification: Awake and drowsy  Duration: 25 minutes   Technical aspects: This EEG study was done with scalp electrodes positioned according to the 10-20 International system of electrode placement. Electrical activity was reviewed with band pass filter of 1-70Hz , sensitivity of 7 uV/mm, display speed of 53mm/sec with a 60Hz  notched filter applied as appropriate. EEG data were recorded continuously and digitally stored.   Description of the recording: The background rhythms of this recording consists of a fairly well modulated medium amplitude alpha rhythm of 9 Hz that is reactive to eye opening and closure. Present in the anterior head region is a 15-20 Hz beta activity. Photic stimulation was performed, did not show any abnormalities. Hyperventilation was also performed, did not show any abnormalities. Drowsiness was not seen. No abnormal epileptiform discharges seen during this recording. There was no focal slowing. There were no electrographic seizure identified.   Abnormality: None   Impression: This is a normal awake EEG. No evidence of interictal epileptiform discharges. Normal EEGs, however, do not rule out epilepsy.    Arcelia Pals, MD [facility]

## 2023-08-27 ENCOUNTER — Encounter: Payer: Self-pay | Admitting: Neurology

## 2023-08-28 ENCOUNTER — Telehealth: Payer: Self-pay | Admitting: *Deleted

## 2023-08-28 NOTE — Telephone Encounter (Signed)
 Pt FMLA form faxed on 08/28/2023 to [phone]

## 2023-09-01 ENCOUNTER — Telehealth: Payer: Self-pay | Admitting: Neurology

## 2023-09-01 ENCOUNTER — Other Ambulatory Visit: Payer: Self-pay | Admitting: Neurology

## 2023-09-01 NOTE — Telephone Encounter (Signed)
 Before I refill I noticed last note stated imitrex  makes her drowsy:  In terms of the headaches, she reports 2 headaches per month. Right-sided headache, on the temporal area, described it as aching pain. Reports that sumatriptan  helps relieve the headaches but will make her drowsy and sleepy   Ok to refill?

## 2023-09-01 NOTE — Telephone Encounter (Signed)
 Patient request refill for SUMAtriptan  (IMITREX ) 50 MG tablet send to  [facility] [phone]

## 2023-09-01 NOTE — Telephone Encounter (Signed)
 Returned call to Martha Schneider and stated that a jury letter was completed for her for April and that we will need the letter with the new date.   Martha Schneider also stated that she wanted Dr. Samara Crest to write her husband out of jury duty as well and they will bring both letters by. I stated that I wasn't sure provider would agree to allow spouse out of jury duty but that I would ask and get back to her asap

## 2023-09-01 NOTE — Telephone Encounter (Signed)
 Pt said have gotten result s in MyChart, but do not know how to use MyChart. Would like a call to go over results Informed patient neurologist has not read the results. Once he reads the results nurse will give you a call with results and any steps going steps going forward.

## 2023-09-01 NOTE — Telephone Encounter (Signed)
 Pt said need a letter to excuse my husband from jury duty due to I can not be left alone. Would like a call back when letter is ready.

## 2023-09-02 NOTE — Telephone Encounter (Signed)
 Dr. Samara Crest,  Are you agreeable to giving the pts husband letter to be excused from jury duty as well? Juror # [id number] scheduled to appear in court to serve 09/29/23 at 8:15am. Thanks,  [employer]

## 2023-09-02 NOTE — Telephone Encounter (Signed)
 Patients husband dropped of jury duty paperwork gave to [facility].

## 2023-09-03 NOTE — Telephone Encounter (Signed)
 [facility] to refill

## 2023-09-04 MED ORDER — SUMATRIPTAN SUCCINATE 50 MG PO TABS: 50.0000 mg | ORAL_TABLET | Freq: Every day | ORAL | 6 refills | Status: DC | PRN

## 2023-09-09 ENCOUNTER — Telehealth: Payer: Self-pay | Admitting: Neurology

## 2023-09-09 NOTE — Telephone Encounter (Signed)
 Pt called wanting to know if her MRI Scan results have been read. Please advise.

## 2023-09-09 NOTE — Telephone Encounter (Signed)
 Primary doctor: Dr. Samara Crest. We did not order MRI, we did EEG.  She confirmed she was calling to get EEG results. I relayed EEG results below. She verbalized understanding. She could not access online.   She reports she is still having headaches. Has sumatriptan  to take prn but afraid to take because it makes her very drowsy.  Getting headaches 2x/week. When she wakes up and sits on side of bed, headache improves some. Wanting to know what Dr. Samara Crest recommends for treatment of ongoing headaches. Aware he is out today. Will consult and call her back with recommendation  Taking other meds as follows: Keppra  1000 mg twice daily, clobazam  5 mg nightly and Vimpat  200 mg twice daily.

## 2023-09-10 ENCOUNTER — Ambulatory Visit: Payer: Self-pay | Admitting: Neurology

## 2023-09-12 ENCOUNTER — Other Ambulatory Visit: Payer: Self-pay | Admitting: Neurology

## 2023-09-12 ENCOUNTER — Telehealth: Payer: Self-pay | Admitting: Neurology

## 2023-09-12 MED ORDER — VERAPAMIL HCL ER 120 MG PO CP24: 120.0000 mg | ORAL_CAPSULE | Freq: Every day | ORAL | 0 refills | Status: DC

## 2023-09-12 NOTE — Telephone Encounter (Signed)
 Please call patient and inform her that I will try her on a medication for headaches that she has to take nightly.

## 2023-09-12 NOTE — Telephone Encounter (Signed)
 Attempted to call Pt to make her aware of new medication. No answer, unable to LVM.

## 2023-09-12 NOTE — Telephone Encounter (Signed)
 Pt called stating that Pt have the wrong dates  on  FMLA Ppaper work fpr her husband. The dates should be for 09-03-23-/09-04-23 and also 09-11-23/09-12-23. Pt states Husband need paperwork filed out correctly so job can cover these days .

## 2023-09-15 ENCOUNTER — Telehealth: Payer: Self-pay | Admitting: Neurology

## 2023-09-15 NOTE — Telephone Encounter (Signed)
 Yes, we can add those days.

## 2023-09-15 NOTE — Telephone Encounter (Signed)
 RETURNED CALL TO PT AND STATED WE WOULD FIX DATES AND RESENT PLACED IN MEDICAL RECORDS BOX

## 2023-09-15 NOTE — Telephone Encounter (Signed)
/  Lvm 2nd attempt by hf 09/15/23

## 2023-09-15 NOTE — Telephone Encounter (Signed)
 FMLA form sent in did not have the correct dates. The dates May 8,9, 15, 16, was not included on the [facility] form. My husband's employer said he could not be cleared for those days because was not on the FMLA form sent in. Could Dr. Samara Crest put on the form my husband at home with me on Mondays, Tuesday's until the end of this month and refax?  We are working on getting someone to stay with me next month. Would like a call back when has been sent to husband's employer.

## 2023-09-16 NOTE — Telephone Encounter (Signed)
 FMLA form with corrected dates faxed

## 2023-10-09 ENCOUNTER — Other Ambulatory Visit: Payer: Self-pay | Admitting: Neurology

## 2023-11-18 ENCOUNTER — Other Ambulatory Visit: Payer: Self-pay | Admitting: Neurology

## 2024-01-20 ENCOUNTER — Other Ambulatory Visit: Payer: Self-pay

## 2024-01-20 ENCOUNTER — Emergency Department (HOSPITAL_COMMUNITY)
Admission: EM | Admit: 2024-01-20 | Discharge: 2024-01-20 | Disposition: A | Discharge status: Home / Self Care | Attending: Emergency Medicine | Admitting: Emergency Medicine

## 2024-01-20 ENCOUNTER — Emergency Department (HOSPITAL_COMMUNITY)

## 2024-01-20 ENCOUNTER — Encounter (HOSPITAL_COMMUNITY): Payer: Self-pay | Admitting: Emergency Medicine

## 2024-01-20 DIAGNOSIS — R569 Unspecified convulsions: Secondary | ICD-10-CM | POA: Insufficient documentation

## 2024-01-20 DIAGNOSIS — Z7982 Long term (current) use of aspirin: Secondary | ICD-10-CM | POA: Insufficient documentation

## 2024-01-20 LAB — BASIC METABOLIC PANEL WITH GFR
Anion gap: 16 — ABNORMAL HIGH (ref 5–15)
BUN: 5 mg/dL — ABNORMAL LOW (ref 6–20)
CO2: 24 mmol/L (ref 22–32)
Calcium: 9.3 mg/dL (ref 8.9–10.3)
Chloride: 95 mmol/L — ABNORMAL LOW (ref 98–111)
Creatinine, Ser: 0.72 mg/dL (ref 0.44–1.00)
GFR, Estimated: >60 mL/min (ref >60)
Glucose, Bld: 132 mg/dL — ABNORMAL HIGH (ref 70–99)
Potassium: 4.5 mmol/L (ref 3.5–5.1)
Sodium: 135 mmol/L (ref 135–145)

## 2024-01-20 LAB — URINE DRUG SCREEN
Amphetamines: NEGATIVE
Barbiturates: NEGATIVE
Benzodiazepines: NEGATIVE
Cocaine: NEGATIVE
Fentanyl: NEGATIVE
Methadone Scn, Ur: NEGATIVE
Opiates: NEGATIVE
Tetrahydrocannabinol: NEGATIVE

## 2024-01-20 LAB — CBC WITH DIFFERENTIAL/PLATELET
Abs Immature Granulocytes: 0.05 K/uL (ref 0.00–0.07)
Basophils Absolute: 0 K/uL (ref 0.0–0.1)
Basophils Relative: 1 %
Eosinophils Absolute: 0 K/uL (ref 0.0–0.5)
Eosinophils Relative: 0 %
HCT: 44 % (ref 36.0–46.0)
Hemoglobin: 14 g/dL (ref 12.0–15.0)
Immature Granulocytes: 1 %
Lymphocytes Relative: 11 %
Lymphs Abs: 0.4 K/uL — ABNORMAL LOW (ref 0.7–4.0)
MCH: 28 pg (ref 26.0–34.0)
MCHC: 31.8 g/dL (ref 30.0–36.0)
MCV: 88 fL (ref 80.0–100.0)
Monocytes Absolute: 0.3 K/uL (ref 0.1–1.0)
Monocytes Relative: 8 %
Neutro Abs: 2.8 K/uL (ref 1.7–7.7)
Neutrophils Relative %: 79 %
Platelets: 220 K/uL (ref 150–400)
RBC: 5 MIL/uL (ref 3.87–5.11)
RDW: 13.7 % (ref 11.5–15.5)
WBC: 3.5 K/uL — ABNORMAL LOW (ref 4.0–10.5)
nRBC: 0 % (ref 0.0–0.2)

## 2024-01-20 LAB — ETHANOL: Alcohol, Ethyl (B): <15 mg/dL (ref <15)

## 2024-01-20 MED ORDER — KETOROLAC TROMETHAMINE 15 MG/ML IJ SOLN
15.0000 mg | Freq: Once | INTRAMUSCULAR | Status: AC
  Administered 2024-01-20: 15 mg via INTRAVENOUS
  Filled 2024-01-20: qty 1

## 2024-01-20 MED ORDER — LEVETIRACETAM (KEPPRA) 500 MG/5 ML ADULT IV PUSH
4000.0000 mg | Freq: Once | INTRAVENOUS | Status: AC
  Administered 2024-01-20: 4000 mg via INTRAVENOUS
  Filled 2024-01-20: qty 40

## 2024-01-20 NOTE — Discharge Instructions (Addendum)
 Today you were seen for a seizure.  Please do not drive until you are seizure-free for 6 months.  Please follow-up with your neurologist for further evaluation and treatment as soon as possible. Thank you for letting us  treat you today. After reviewing your labs and imaging, I feel you are safe to go home. Please follow up with your PCP in the next several days and provide them with your records from this visit. Return to the Emergency Room if pain becomes severe or symptoms worsen.

## 2024-01-20 NOTE — ED Provider Notes (Signed)
  EMERGENCY DEPARTMENT AT [facility] Provider Note   CSN: [id number] Arrival date & time: 01/20/24  9887     Patient presents with: Seizures   Martha Schneider is a 58 y.o. female presents today via EMS for seizure witnessed by husband while in bed lasting approximately 4 to 5 minutes.  Patient's husband reports compliance with seizure medication.  No other HPI able to be gathered due to level 5 caveat.    Seizures      Prior to Admission medications   Medication Sig Start Date End Date Taking? Authorizing Provider  amLODipine  (NORVASC ) 10 MG tablet Take 10 mg by mouth daily. 01/20/21   [provider]  aspirin 325 MG tablet Take 325 mg by mouth daily.    [provider]  cloBAZam  (ONFI ) 10 MG tablet Take 1 tablet (10 mg total) by mouth at bedtime. 03/26/23 09/22/23  Camara, Amadou, MD  diazepam  (DIASTAT ) 2.5 MG GEL Place 2.5 mg rectally as needed for seizure. 04/16/22   Claudene Reeves A, MD  lacosamide  (VIMPAT ) 200 MG TABS tablet TAKE 1 TABLET BY MOUTH TWICE A DAY 08/08/23   Gregg Lek, MD  levETIRAcetam  (KEPPRA ) 500 MG tablet TAKE 2 TABLETS BY MOUTH TWICE A DAY 11/18/23   Camara, Amadou, MD  nicotine  (NICODERM CQ  - DOSED IN MG/24 HOURS) 14 mg/24hr patch Place 1 patch (14 mg total) onto the skin daily. 04/17/22   Claudene Reeves LABOR, MD  Nicotine  (NICODERM CQ  TD) Place 1 patch onto the skin daily as needed (for smoking cessation while hospitalized).    [provider]  sertraline  (ZOLOFT ) 100 MG tablet Take 1 tablet (100 mg total) by mouth daily. 06/04/22 08/18/24  Camara, Amadou, MD  SUMAtriptan  (IMITREX ) 50 MG tablet Take 1 tablet (50 mg total) by mouth daily as needed for migraine (may repeat if migraine persists in 2 hours, not to exceed 2 tablets in 1 day). May repeat in 2 hours if headache persists or recurs. 09/04/23   Gregg Lek, MD  UNKNOWN TO PATIENT Place 3 drops into both eyes See admin instructions. Unnamed eye drops (otc): Instill  3 drops into both eyes every morning- for irritation and/or allergies    [provider]  verapamil  (VERELAN ) 120 MG 24 hr capsule TAKE 1 CAPSULE (120 MG TOTAL) BY MOUTH AT BEDTIME. 10/13/23   Gregg Lek, MD    Allergies: Patient has no known allergies.    Review of Systems  Reason unable to perform ROS: Level 5 caveat, patient postictal.  Neurological:  Positive for seizures.    Updated Vital Signs BP (!) 141/128   Pulse 92   Temp 98.4 F (36.9 C) (Axillary)   Resp (!) 22   SpO2 92%   Physical Exam Vitals and nursing note reviewed.  Constitutional:      General: She is not in acute distress.    Appearance: Normal appearance. She is well-developed. She is not ill-appearing.     Comments: Postictal  HENT:     Head: Normocephalic and atraumatic.     Right Ear: External ear normal.     Left Ear: External ear normal.  Cardiovascular:     Rate and Rhythm: Normal rate and regular rhythm.     Pulses: Normal pulses.     Heart sounds: Normal heart sounds. No murmur heard. Pulmonary:     Effort: Pulmonary effort is normal. No respiratory distress.     Breath sounds: Normal breath sounds.  Abdominal:     Palpations:  Abdomen is soft.     Tenderness: There is no abdominal tenderness.  Musculoskeletal:        General: No swelling.     Cervical back: Neck supple.  Skin:    General: Skin is warm and dry.     Capillary Refill: Capillary refill takes less than 2 seconds.  Psychiatric:        Mood and Affect: Mood normal.     (all labs ordered are listed, but only abnormal results are displayed) Labs Reviewed  CBC WITH DIFFERENTIAL/PLATELET - Abnormal; Notable for the following components:      Result Value   WBC 3.5 (*)    Lymphs Abs 0.4 (*)    All other components within normal limits  BASIC METABOLIC PANEL WITH GFR - Abnormal; Notable for the following components:   Chloride 95 (*)    Glucose, Bld 132 (*)    BUN 5 (*)    Anion gap 16 (*)    All other  components within normal limits  URINE DRUG SCREEN  ETHANOL    EKG: EKG Interpretation Date/Time:  Tuesday January 20 2024 01:20:20 EDT Ventricular Rate:  85 PR Interval:  238 QRS Duration:  101 QT Interval:  417 QTC Calculation: 496 R Axis:   -59  Text Interpretation: Sinus rhythm Prolonged PR interval Confirmed by Palumbo, April ([id number]) on 01/20/2024 1:39:09 AM  Radiology: CT Head Wo Contrast Result Date: 01/20/2024 EXAM: CT HEAD WITHOUT CONTRAST 01/20/2024 02:02:58 AM TECHNIQUE: CT of the head was performed without the administration of intravenous contrast. Automated exposure control, iterative reconstruction, and/or weight based adjustment of the mA/kV was utilized to reduce the radiation dose to as low as reasonably achievable. COMPARISON: 10/02/2022 CLINICAL HISTORY: Syncope/presyncope, cerebrovascular cause suspected. Seizure witnessed by husband lasted 4-5 mins in the bed. Per report patient vomiting x 1 after seizure. Family report patient compliant with her seizure medication. FINDINGS: BRAIN AND VENTRICLES: No acute hemorrhage. No evidence of acute infarct. No hydrocephalus. No extra-axial collection. No mass effect or midline shift. Mild global cortical atrophy, advanced for age. Subcortical and periventricular small vessel ischemic changes. ORBITS: No acute abnormality. SINUSES: No acute abnormality. SOFT TISSUES AND SKULL: No acute soft tissue abnormality. No skull fracture. IMPRESSION: 1. No acute intracranial abnormality. 2. Mild cortical atrophy. Small vessel ischemic changes. Electronically signed by: Pinkie Pebbles MD 01/20/2024 02:07 AM EDT RP Workstation: [id number]     Procedures   Medications Ordered in the ED  levETIRAcetam  (KEPPRA ) undiluted injection 4,000 mg (4,000 mg Intravenous Given 01/20/24 0144)                                    Medical Decision Making Amount and/or Complexity of Data Reviewed Labs: ordered. Radiology: ordered.   This patient  presents to the ED for concern of seizure, this involves an extensive number of treatment options, and is a complaint that carries with it a high risk of complications and morbidity.  The differential diagnosis includes seizure   Co morbidities / Chronic conditions that complicate the patient evaluation  Seizure, hypertension, alcohol abuse,   Additional history obtained:  Additional history obtained from EMR External records from outside source obtained and reviewed including Care Everywhere   Lab Tests:  I Ordered, and personally interpreted labs.  The pertinent results include: Leukopenia 3.5 which is chronic per historical value, UDS negative, mild anion gap 16, alcohol negative   Imaging Studies ordered:  I ordered imaging studies including CT head Noncon I independently visualized and interpreted imaging which showed no acute intracranial abnormality I agree with the radiologist interpretation   Cardiac Monitoring: / EKG:  The patient was maintained on a cardiac monitor.  I personally viewed and interpreted the cardiac monitored which showed an underlying rhythm of: Sinus rhythm, prolonged PR   Problem List / ED Course / Critical interventions / Medication management I ordered medication including Keppra  I have reviewed the patients home medicines and have made adjustments as needed Patient alert and oriented upon reassessment  Test / Admission - Considered:  Consider for admission or further workup however patient's vital signs, physical exam, labs, and imaging are reassuring.  Patient got loading dose of Keppra  while in the emergency department.  Patient has not had any seizures or seizure-like activity while in the ED for approximately 3-1/2 hours.  Patient advised to not drive a motor vehicle until seizure-free for at least 6 months.  Patient to follow-up with neurology for further evaluation workup.  Patient given return precautions.  I feel patient is safe for  discharge at this time.     Final diagnoses:  Seizure [facility])    ED Discharge Orders     None          Yazan Gatling N, PA-C 01/20/24 0455    Palumbo, April, MD 01/20/24 [id number]

## 2024-01-20 NOTE — ED Triage Notes (Addendum)
 Patient BIB EMS from home c/o seizure. Per report Seizure witnessed by husband lasted 4-5 mins in the bed. Per report patient vomiting x 1 after seizure. Family report patient compliant with her seizure medication. 2LNC started by EMS after patient desat to 86-88%. Patient postictal upon arrival.

## 2024-01-25 ENCOUNTER — Other Ambulatory Visit: Payer: Self-pay | Admitting: Neurology

## 2024-03-10 ENCOUNTER — Ambulatory Visit (INDEPENDENT_AMBULATORY_CARE_PROVIDER_SITE_OTHER): Admitting: Neurology

## 2024-03-10 ENCOUNTER — Encounter: Payer: Self-pay | Admitting: Neurology

## 2024-03-10 VITALS — BP 117/82 | HR 87 | Ht 63.0 in | Wt 152.5 lb

## 2024-03-10 DIAGNOSIS — R519 Headache, unspecified: Secondary | ICD-10-CM | POA: Diagnosis not present

## 2024-03-10 DIAGNOSIS — Z5181 Encounter for therapeutic drug level monitoring: Secondary | ICD-10-CM

## 2024-03-10 DIAGNOSIS — G40909 Epilepsy, unspecified, not intractable, without status epilepticus: Secondary | ICD-10-CM

## 2024-03-10 DIAGNOSIS — G8929 Other chronic pain: Secondary | ICD-10-CM | POA: Diagnosis not present

## 2024-03-10 MED ORDER — LACOSAMIDE 200 MG PO TABS: 200.0000 mg | ORAL_TABLET | Freq: Two times a day (BID) | ORAL | 3 refills | Status: AC

## 2024-03-10 MED ORDER — CLOBAZAM 10 MG PO TABS: 10.0000 mg | ORAL_TABLET | Freq: Every evening | ORAL | 5 refills | Status: AC

## 2024-03-10 MED ORDER — VALTOCO 15 MG DOSE 2 X 7.5 MG/0.1ML NA LQPK: 15.0000 mg | NASAL | 5 refills | Status: AC | PRN

## 2024-03-10 MED ORDER — SUMATRIPTAN SUCCINATE 50 MG PO TABS: 50.0000 mg | ORAL_TABLET | Freq: Every day | ORAL | 11 refills | Status: AC | PRN

## 2024-03-10 MED ORDER — LEVETIRACETAM 500 MG PO TABS: 1000.0000 mg | ORAL_TABLET | Freq: Two times a day (BID) | ORAL | 3 refills | Status: AC

## 2024-03-10 NOTE — Progress Notes (Signed)
 [facility]  PATIENT: Martha Schneider DOB: 1966-04-12  REQUESTING CLINICIAN: Koirala, Dibas, MD HISTORY FROM: Patient and husband  REASON FOR VISIT: Seizure disorder    HISTORICAL  CHIEF COMPLAINT:  Chief Complaint  Patient presents with   Follow-up    Pt in room 12. Husband in room. Here for seizure follow up.    INTERVAL HISTORY 03/10/2024 Patient presents today for follow-up, last visit was in April, since then she has been doing well but continues to have breakthrough seizures.  Husband tells me in the past 6 months she had about 5 seizures; the one in September required her to visit to the hospital, there was no infection, no triggers identified.  She does report side compliance with her medications.  The side effect of somnolence has improved.   INTERVAL HISTORY 08/19/2023:  Patient presents today for follow-up, last visit was in May at that time we continued her on Vimpat  and Keppra  but she continued to have breakthrough seizures.  Clobazam  was added, since the addition of clobazam , she tells me that she is feeling weak, sometimes will have dizzy spell.  Husband has also found her on the floor not sure if she did have a seizure or not.  She reports compliance with the medication.   INTERVAL HISTORY 09/18/2022:  Patient presents today for follow-up, she is accompanied by her husband.  Last visit was in February.  Since then she has not had any seizure or seizure-like activity.  She is compliant with the Keppra  and lacosamide .  Denies any side effect from the medication. In terms of the headaches, she reports 2 headaches per month.  Right-sided headache, on the temporal area, described it as aching pain.  Reports that sumatriptan  helps relieve the headaches but will make her drowsy and sleepy.  No other concerns or complaints.   HISTORY OF PRESENT ILLNESS:  This is a 58 year old woman past medical history of hypertension, sarcoidosis, seizure disorder who is  presenting to establish care.  Patient reports her first seizure was in December 2022. She was in her car taking a break and the next thing that she knows is waking up in the hospital.  She was told that she had a seizure while in the car. Her workup at that time showed a normal brain imaging but her EEG was abnormal. She was started on Keppra .  She was on Keppra , doing well and in December 2023, had a breakthrough seizure.  Both husband and son found her in on the floor in her bedroom unresponsive.  She was taken to the hospital, observed for another day.  On discharge, Vimpat  was added.  Currently she is on Keppra  1500 mg twice daily and Vimpat  100 mg twice daily, denies any side effect from the medication.  Her main complaint right now is headaches, patient was previously on Zoloft  but since running out of medication she has not refilled it.   Handedness: Right handed   Onset: December 2022   Seizure Type: Convulsion   Current frequency: Total of 2 or possible 3 seizures   Any injuries from seizures: Denies   Seizure risk factors:  Denies   Previous ASMs: Levetiracetam , Lacosamide , Clobazam    Currenty ASMs: Levetiracetam  1000 mg twice daily, Lacosamide  200 mg twice daily, Clobazam  10 mg nightly   ASMs side effects: Denies   Brain Images: No acute findings   Previous EEGs: Focal motor seizure seen on EEG, L LPD +R   OTHER MEDICAL CONDITIONS: Seizure disorder, Hypertension, Depression  REVIEW OF SYSTEMS: Full 14 system review of systems performed and negative with exception of: As noted in the HPI   ALLERGIES: No Known Allergies  HOME MEDICATIONS: Outpatient Medications Prior to Visit  Medication Sig Dispense Refill   amLODipine  (NORVASC ) 10 MG tablet Take 10 mg by mouth daily.     aspirin 325 MG tablet Take 325 mg by mouth daily.     nicotine  (NICODERM CQ  - DOSED IN MG/24 HOURS) 14 mg/24hr patch Place 1 patch (14 mg total) onto the skin daily. 28 patch 0   sertraline   (ZOLOFT ) 100 MG tablet Take 1 tablet (100 mg total) by mouth daily. 30 tablet 0   UNKNOWN TO PATIENT Place 3 drops into both eyes See admin instructions. Unnamed eye drops (otc): Instill 3 drops into both eyes every morning- for irritation and/or allergies     verapamil  (VERELAN ) 120 MG 24 hr capsule TAKE 1 CAPSULE (120 MG TOTAL) BY MOUTH AT BEDTIME. 90 capsule 1   cloBAZam  (ONFI ) 10 MG tablet Take 1 tablet (10 mg total) by mouth at bedtime. 30 tablet 5   diazepam  (DIASTAT ) 2.5 MG GEL Place 2.5 mg rectally as needed for seizure. 1 kit 1   lacosamide  (VIMPAT ) 200 MG TABS tablet TAKE 1 TABLET BY MOUTH TWICE A DAY 180 tablet 3   levETIRAcetam  (KEPPRA ) 500 MG tablet TAKE 2 TABLETS BY MOUTH TWICE A DAY 120 tablet 2   Nicotine  (NICODERM CQ  TD) Place 1 patch onto the skin daily as needed (for smoking cessation while hospitalized). (Patient not taking: Reported on 03/10/2024)     SUMAtriptan  (IMITREX ) 50 MG tablet Take 1 tablet (50 mg total) by mouth daily as needed for migraine (may repeat if migraine persists in 2 hours, not to exceed 2 tablets in 1 day). May repeat in 2 hours if headache persists or recurs. 10 tablet 6   No facility-administered medications prior to visit.    PAST MEDICAL HISTORY: Past Medical History:  Diagnosis Date   Depression    Hypertension    Sarcoidosis    Seizures (HCC)     PAST SURGICAL HISTORY: History reviewed. No pertinent surgical history.  FAMILY HISTORY: Family History  Problem Relation Age of Onset   Heart attack Mother    Liver cancer Father    Diabetes Mellitus II Sister    Kidney disease Sister     SOCIAL HISTORY: Social History   Socioeconomic History   Marital status: Married    Spouse name: Not on file   Number of children: 2   Years of education: Not on file   Highest education level: Not on file  Occupational History   Not on file  Tobacco Use   Smoking status: Some Days    Current packs/day: 0.50    Average packs/day: 0.5 packs/day  for 37.0 years (18.5 ttl pk-yrs)    Types: Cigarettes   Smokeless tobacco: Never  Vaping Use   Vaping status: Never Used  Substance and Sexual Activity   Alcohol use: Yes    Comment: occasionally   Drug use: No   Sexual activity: Not on file  Other Topics Concern   Not on file  Social History Narrative   Not on file   Social Drivers of Health   Financial Resource Strain: Not on file  Food Insecurity: Not on file  Transportation Needs: Not on file  Physical Activity: Not on file  Stress: Not on file  Social Connections: Not on file  Intimate Partner Violence: Not  on file    PHYSICAL EXAM  GENERAL EXAM/CONSTITUTIONAL: Vitals:  Vitals:   03/10/24 1002  BP: 117/82  Pulse: 87  SpO2: 95%  Weight: 152 lb 8 oz (69.2 kg)  Height: 5' 3 (1.6 m)     Body mass index is 27.01 kg/m. Wt Readings from Last 3 Encounters:  03/10/24 152 lb 8 oz (69.2 kg)  08/19/23 153 lb 8 oz (69.6 kg)  10/02/22 137 lb (62.1 kg)   Patient is in no distress; well developed, nourished and groomed; neck is supple  MUSCULOSKELETAL: Gait, strength, tone, movements noted in Neurologic exam below  NEUROLOGIC: MENTAL STATUS:      No data to display         awake, alert, oriented to person, place and time recent and remote memory intact normal attention and concentration language fluent, comprehension intact, naming intact fund of knowledge appropriate  CRANIAL NERVE:  2nd, 3rd, 4th, 6th - Visual fields full to confrontation, extraocular muscles intact, no nystagmus 5th - facial sensation symmetric 7th - facial strength symmetric 8th - hearing intact 9th - palate elevates symmetrically, uvula midline 11th - shoulder shrug symmetric 12th - tongue protrusion midline  MOTOR:  normal bulk and tone, full strength in the BUE, BLE  SENSORY:  normal and symmetric to light touch  COORDINATION:  finger-nose-finger, fine finger movements normal  GAIT/STATION:  Needs husband assistance     DIAGNOSTIC DATA (LABS, IMAGING, TESTING) - I reviewed patient records, labs, notes, testing and imaging myself where available.  Lab Results  Component Value Date   WBC 3.5 (L) 01/20/2024   HGB 14.0 01/20/2024   HCT 44.0 01/20/2024   MCV 88.0 01/20/2024   PLT 220 01/20/2024      Component Value Date/Time   NA 135 01/20/2024 0321   K 4.5 01/20/2024 0321   CL 95 (L) 01/20/2024 0321   CO2 24 01/20/2024 0321   GLUCOSE 132 (H) 01/20/2024 0321   BUN 5 (L) 01/20/2024 0321   CREATININE 0.72 01/20/2024 0321   CREATININE 0.78 05/10/2020 1037   CREATININE 0.76 12/17/2013 1653   CALCIUM  9.3 01/20/2024 0321   PROT 7.4 11/25/2022 2046   ALBUMIN 3.9 11/25/2022 2046   AST 38 11/25/2022 2046   AST 26 05/10/2020 1037   ALT 22 11/25/2022 2046   ALT 14 05/10/2020 1037   ALKPHOS 59 11/25/2022 2046   BILITOT 0.8 11/25/2022 2046   BILITOT 0.6 05/10/2020 1037   GFRNONAA >60 01/20/2024 0321   GFRNONAA >60 05/10/2020 1037   GFRNONAA >89 12/17/2013 1653   GFRAA >60 10/20/2019 1028   GFRAA >89 12/17/2013 1653   Lab Results  Component Value Date   CHOL 162 02/06/2007   HDL 61 02/06/2007   LDLCALC 81 02/06/2007   TRIG 100 02/06/2007   Lab Results  Component Value Date   HGBA1C 5.7 (H) 12/17/2013   Lab Results  Component Value Date   VITAMINB12 1,267 (H) 10/20/2019   Lab Results  Component Value Date   TSH 3.905 04/08/2021    LTM 04/09/2021 -Focal seizure, left frontotemporal region -Brief ictal-interictal rhythmic discharges, left hemisphere - Lateralized periodic discharges with superimposed rhythmicity ( LPD +R) left hemisphere - Continuous slow, generalized   Routine EEG 08/25/2023 Normal    MRI Brain 04/10/2021 Low level restricted diffusion, increased T2 signal and increased FLAIR signal within the left medial thalamus, left cingulate gyrus, anterior left frontal lobe, and left insula. These findings are felt most likely to be postictal phenomenon rather than  ischemic infarctions   ASSESSMENT AND PLAN  58 y.o. year old female  with hypertension, sarcoid, seizure disorder who is presenting for follow up.  She continues to have breakthrough seizures despite Keppra  1000 mg twice daily, Vimpat  200 mg twice daily and clobazam  10 mg nightly.  Her last seizure was in September.  Plan will be to continue current medications, she is reluctant to increase the dose due to possible side effects.  We also discussed VNS therapy and they will read the information and will make a decision.  I am going to prescribe them Valtoco  as a rescue medication.  I will see him in 6 months for follow-up or sooner if worse but patient and family understand to contact me for any additional question or concerns.  I will complete her disability paperwork.   1. Nonintractable epilepsy without status epilepticus, unspecified epilepsy type (HCC)   2. Therapeutic drug monitoring   3. Chronic nonintractable headache, unspecified headache type      Patient Instructions  Continue current medications including Levetiracetam  1000 mg twice daily Lacosamide  200 mg twice daily Clobazam  10 mg nightly Will prescribe Valtoco  as rescue medication Will obtain antiseizure medication level today Discussed VNS therapy Return in 6 to 8 months or sooner if worse   Per [place]  DMV statutes, patients with seizures are not allowed to drive until they have been seizure-free for six months.  Other recommendations include using caution when using heavy equipment or power tools. Avoid working on ladders or at heights. Take showers instead of baths.  Do not swim alone.  Ensure the water temperature is not too high on the home water heater. Do not go swimming alone. Do not lock yourself in a room alone (i.e. bathroom). When caring for infants or small children, sit down when holding, feeding, or changing them to minimize risk of injury to the child in the event you have a seizure. Maintain good sleep  hygiene. Avoid alcohol.  Also recommend adequate sleep, hydration, good diet and minimize stress.   During the Seizure  - First, ensure adequate ventilation and place patients on the floor on their left side  Loosen clothing around the neck and ensure the airway is patent. If the patient is clenching the teeth, do not force the mouth open with any object as this can cause severe damage - Remove all items from the surrounding that can be hazardous. The patient may be oblivious to what's happening and may not even know what he or she is doing. If the patient is confused and wandering, either gently guide him/her away and block access to outside areas - Reassure the individual and be comforting - Call 911. In most cases, the seizure ends before EMS arrives. However, there are cases when seizures may last over 3 to 5 minutes. Or the individual may have developed breathing difficulties or severe injuries. If a pregnant patient or a person with diabetes develops a seizure, it is prudent to call an ambulance. - Finally, if the patient does not regain full consciousness, then call EMS. Most patients will remain confused for about 45 to 90 minutes after a seizure, so you must use judgment in calling for help. - Avoid restraints but make sure the patient is in a bed with padded side rails - Place the individual in a lateral position with the neck slightly flexed; this will help the saliva drain from the mouth and prevent the tongue from falling backward - Remove all nearby furniture and other  hazards from the area - Provide verbal assurance as the individual is regaining consciousness - Provide the patient with privacy if possible - Call for help and start treatment as ordered by the caregiver   After the Seizure (Postictal Stage)  After a seizure, most patients experience confusion, fatigue, muscle pain and/or a headache. Thus, one should permit the individual to sleep. For the next few days, reassurance  is essential. Being calm and helping reorient the person is also of importance.  Most seizures are painless and end spontaneously. Seizures are not harmful to others but can lead to complications such as stress on the lungs, brain and the heart. Individuals with prior lung problems may develop labored breathing and respiratory distress.     Orders Placed This Encounter  Procedures   Lacosamide    CLOBAZAM    Levetiracetam  level   CMP    Meds ordered this encounter  Medications   diazePAM , 15 MG Dose, (VALTOCO  15 MG DOSE) 2 x 7.5 MG/0.1ML LQPK    Sig: Place 15 mg into the nose as needed (for seizure). Please provide a total of 5 box/10 doses    Dispense:  5 each    Refill:  5   SUMAtriptan  (IMITREX ) 50 MG tablet    Sig: Take 1 tablet (50 mg total) by mouth daily as needed for migraine (may repeat if migraine persists in 2 hours, not to exceed 2 tablets in 1 day). May repeat in 2 hours if headache persists or recurs.    Dispense:  10 tablet    Refill:  11   levETIRAcetam  (KEPPRA ) 500 MG tablet    Sig: Take 2 tablets (1,000 mg total) by mouth 2 (two) times daily.    Dispense:  360 tablet    Refill:  3   lacosamide  (VIMPAT ) 200 MG TABS tablet    Sig: Take 1 tablet (200 mg total) by mouth 2 (two) times daily.    Dispense:  180 tablet    Refill:  3   cloBAZam  (ONFI ) 10 MG tablet    Sig: Take 1 tablet (10 mg total) by mouth at bedtime.    Dispense:  30 tablet    Refill:  5    Return in about 6 months (around 09/07/2024).    Pastor Falling, MD 03/10/2024, 10:37 AM  [facility] [address] [place], [place] [address] [phone]

## 2024-03-10 NOTE — Patient Instructions (Addendum)
 Continue current medications including Levetiracetam  1000 mg twice daily Lacosamide  200 mg twice daily Clobazam  10 mg nightly Will prescribe Valtoco  as rescue medication Will obtain antiseizure medication level today Discussed VNS therapy Return in 6 to 8 months or sooner if worse

## 2024-03-15 ENCOUNTER — Ambulatory Visit: Payer: Self-pay | Admitting: Neurology

## 2024-03-15 LAB — COMPREHENSIVE METABOLIC PANEL WITH GFR
ALT: 14 IU/L (ref 0–32)
AST: 25 IU/L (ref 0–40)
Albumin: 4.2 g/dL (ref 3.8–4.9)
Alkaline Phosphatase: 80 IU/L (ref 49–135)
BUN/Creatinine Ratio: 7 — ABNORMAL LOW (ref 9–23)
BUN: 5 mg/dL — ABNORMAL LOW (ref 6–24)
Bilirubin Total: 0.3 mg/dL (ref 0.0–1.2)
CO2: 22 mmol/L (ref 20–29)
Calcium: 8.7 mg/dL (ref 8.7–10.2)
Chloride: 104 mmol/L (ref 96–106)
Creatinine, Ser: 0.69 mg/dL (ref 0.57–1.00)
Globulin, Total: 3.1 g/dL (ref 1.5–4.5)
Glucose: 70 mg/dL (ref 70–99)
Potassium: 4.1 mmol/L (ref 3.5–5.2)
Sodium: 141 mmol/L (ref 134–144)
Total Protein: 7.3 g/dL (ref 6.0–8.5)
eGFR: 100 mL/min/1.73 (ref >59)

## 2024-03-15 LAB — LACOSAMIDE: Lacosamide: 13.3 ug/mL — ABNORMAL HIGH (ref 5.0–10.0)

## 2024-03-15 LAB — CLOBAZAM
Clobazam: <10 ng/mL — AB (ref 30–300)
Desmethylclobazam: <50 ng/mL — AB (ref 300–3000)

## 2024-03-15 LAB — LEVETIRACETAM LEVEL: Levetiracetam Lvl: 69.7 ug/mL — ABNORMAL HIGH (ref 10.0–40.0)

## 2024-03-29 ENCOUNTER — Telehealth: Payer: Self-pay | Admitting: Neurology

## 2024-03-29 ENCOUNTER — Telehealth: Payer: Self-pay | Admitting: *Deleted

## 2024-03-29 NOTE — Telephone Encounter (Signed)
 Pt disability form ready for p/u @ the front desk

## 2024-03-29 NOTE — Telephone Encounter (Signed)
 Patient checking on if social security forms have to filled out for disability. Patient said husband will be by today to pay the $50 to have forms filled out. Informed would need to pay the fee before forms can be filled out.

## 2024-04-06 ENCOUNTER — Other Ambulatory Visit: Payer: Self-pay

## 2024-04-06 ENCOUNTER — Emergency Department (HOSPITAL_COMMUNITY)

## 2024-04-06 ENCOUNTER — Observation Stay (HOSPITAL_COMMUNITY)
Admission: EM | Admit: 2024-04-06 | Discharge: 2024-04-07 | Attending: Emergency Medicine | Admitting: Emergency Medicine

## 2024-04-06 DIAGNOSIS — I214 Non-ST elevation (NSTEMI) myocardial infarction: Secondary | ICD-10-CM | POA: Diagnosis not present

## 2024-04-06 DIAGNOSIS — R7989 Other specified abnormal findings of blood chemistry: Secondary | ICD-10-CM | POA: Diagnosis not present

## 2024-04-06 DIAGNOSIS — I10 Essential (primary) hypertension: Secondary | ICD-10-CM | POA: Diagnosis present

## 2024-04-06 DIAGNOSIS — R0602 Shortness of breath: Secondary | ICD-10-CM | POA: Diagnosis present

## 2024-04-06 DIAGNOSIS — F32A Depression, unspecified: Secondary | ICD-10-CM | POA: Diagnosis not present

## 2024-04-06 DIAGNOSIS — D869 Sarcoidosis, unspecified: Secondary | ICD-10-CM | POA: Diagnosis present

## 2024-04-06 DIAGNOSIS — Z79899 Other long term (current) drug therapy: Secondary | ICD-10-CM | POA: Diagnosis not present

## 2024-04-06 DIAGNOSIS — D72819 Decreased white blood cell count, unspecified: Secondary | ICD-10-CM | POA: Diagnosis present

## 2024-04-06 DIAGNOSIS — R0600 Dyspnea, unspecified: Principal | ICD-10-CM

## 2024-04-06 DIAGNOSIS — Z5329 Procedure and treatment not carried out because of patient's decision for other reasons: Secondary | ICD-10-CM | POA: Diagnosis not present

## 2024-04-06 DIAGNOSIS — G40909 Epilepsy, unspecified, not intractable, without status epilepticus: Secondary | ICD-10-CM

## 2024-04-06 DIAGNOSIS — F17210 Nicotine dependence, cigarettes, uncomplicated: Secondary | ICD-10-CM | POA: Diagnosis not present

## 2024-04-06 LAB — COMPREHENSIVE METABOLIC PANEL WITH GFR
ALT: 17 U/L (ref 0–44)
AST: 39 U/L (ref 15–41)
Albumin: 4.7 g/dL (ref 3.5–5.0)
Alkaline Phosphatase: 101 U/L (ref 38–126)
Anion gap: 13 (ref 5–15)
BUN: 8 mg/dL (ref 6–20)
CO2: 28 mmol/L (ref 22–32)
Calcium: 9.7 mg/dL (ref 8.9–10.3)
Chloride: 99 mmol/L (ref 98–111)
Creatinine, Ser: 0.84 mg/dL (ref 0.44–1.00)
GFR, Estimated: >60 mL/min (ref >60)
Glucose, Bld: 108 mg/dL — ABNORMAL HIGH (ref 70–99)
Potassium: 4.4 mmol/L (ref 3.5–5.1)
Sodium: 139 mmol/L (ref 135–145)
Total Bilirubin: 0.6 mg/dL (ref 0.0–1.2)
Total Protein: 8.9 g/dL — ABNORMAL HIGH (ref 6.5–8.1)

## 2024-04-06 LAB — RESP PANEL BY RT-PCR (RSV, FLU A&B, COVID)  RVPGX2
Influenza A by PCR: NEGATIVE
Influenza B by PCR: NEGATIVE
Resp Syncytial Virus by PCR: NEGATIVE
SARS Coronavirus 2 by RT PCR: NEGATIVE

## 2024-04-06 LAB — CBC
HCT: 47.4 % — ABNORMAL HIGH (ref 36.0–46.0)
Hemoglobin: 15.5 g/dL — ABNORMAL HIGH (ref 12.0–15.0)
MCH: 29.4 pg (ref 26.0–34.0)
MCHC: 32.7 g/dL (ref 30.0–36.0)
MCV: 89.8 fL (ref 80.0–100.0)
Platelets: 233 K/uL (ref 150–400)
RBC: 5.28 MIL/uL — ABNORMAL HIGH (ref 3.87–5.11)
RDW: 14.3 % (ref 11.5–15.5)
WBC: 2.9 K/uL — ABNORMAL LOW (ref 4.0–10.5)
nRBC: 0 % (ref 0.0–0.2)

## 2024-04-06 LAB — TROPONIN T, HIGH SENSITIVITY
Troponin T High Sensitivity: 50 ng/L — ABNORMAL HIGH (ref 0–19)
Troponin T High Sensitivity: 62 ng/L — ABNORMAL HIGH (ref 0–19)
Troponin T High Sensitivity: 49 ng/L — ABNORMAL HIGH (ref 0–19)

## 2024-04-06 LAB — GROUP A STREP BY PCR: Group A Strep by PCR: NOT DETECTED

## 2024-04-06 LAB — MAGNESIUM: Magnesium: 2.1 mg/dL (ref 1.7–2.4)

## 2024-04-06 MED ORDER — LEVETIRACETAM 500 MG PO TABS
1000.0000 mg | ORAL_TABLET | Freq: Two times a day (BID) | ORAL | Status: DC
  Administered 2024-04-06 – 2024-04-07 (×2): 1000 mg via ORAL
  Filled 2024-04-06 (×2): qty 2

## 2024-04-06 MED ORDER — HEPARIN (PORCINE) 25000 UT/250ML-% IV SOLN
900.0000 [IU]/h | INTRAVENOUS | Status: DC
  Administered 2024-04-06: 800 [IU]/h via INTRAVENOUS
  Filled 2024-04-06: qty 250

## 2024-04-06 MED ORDER — DEXAMETHASONE SOD PHOSPHATE PF 10 MG/ML IJ SOLN
10.0000 mg | Freq: Once | INTRAMUSCULAR | Status: AC
  Administered 2024-04-06: 10 mg via INTRAVENOUS

## 2024-04-06 MED ORDER — ACETAMINOPHEN 325 MG PO TABS: 650.0000 mg | ORAL_TABLET | Freq: Four times a day (QID) | ORAL | Status: DC | PRN

## 2024-04-06 MED ORDER — SENNOSIDES-DOCUSATE SODIUM 8.6-50 MG PO TABS: 1.0000 | ORAL_TABLET | Freq: Every evening | ORAL | Status: DC | PRN

## 2024-04-06 MED ORDER — SERTRALINE HCL 100 MG PO TABS
100.0000 mg | ORAL_TABLET | Freq: Every day | ORAL | Status: DC
  Administered 2024-04-06 – 2024-04-07 (×2): 100 mg via ORAL
  Filled 2024-04-06 (×2): qty 1

## 2024-04-06 MED ORDER — RACEPINEPHRINE HCL 2.25 % IN NEBU
0.5000 mL | INHALATION_SOLUTION | Freq: Once | RESPIRATORY_TRACT | Status: AC
  Administered 2024-04-06: 0.5 mL via RESPIRATORY_TRACT
  Filled 2024-04-06: qty 15

## 2024-04-06 MED ORDER — SODIUM CHLORIDE 0.9% FLUSH
3.0000 mL | Freq: Two times a day (BID) | INTRAVENOUS | Status: DC
  Administered 2024-04-06: 3 mL via INTRAVENOUS

## 2024-04-06 MED ORDER — ALBUTEROL SULFATE (2.5 MG/3ML) 0.083% IN NEBU: 2.5000 mg | INHALATION_SOLUTION | Freq: Four times a day (QID) | RESPIRATORY_TRACT | Status: DC | PRN

## 2024-04-06 MED ORDER — LACOSAMIDE 50 MG PO TABS
200.0000 mg | ORAL_TABLET | Freq: Two times a day (BID) | ORAL | Status: DC
  Administered 2024-04-06 – 2024-04-07 (×2): 200 mg via ORAL
  Filled 2024-04-06 (×2): qty 4

## 2024-04-06 MED ORDER — ASPIRIN 325 MG PO TABS
325.0000 mg | ORAL_TABLET | Freq: Every day | ORAL | Status: DC
  Administered 2024-04-06 – 2024-04-07 (×2): 325 mg via ORAL
  Filled 2024-04-06 (×2): qty 1

## 2024-04-06 MED ORDER — AMLODIPINE BESYLATE 10 MG PO TABS
10.0000 mg | ORAL_TABLET | Freq: Every day | ORAL | Status: DC
  Administered 2024-04-07: 10 mg via ORAL
  Filled 2024-04-06: qty 1

## 2024-04-06 MED ORDER — SODIUM CHLORIDE (PF) 0.9 % IJ SOLN
INTRAMUSCULAR | Status: AC
  Filled 2024-04-06: qty 50

## 2024-04-06 MED ORDER — HEPARIN BOLUS VIA INFUSION
3500.0000 [IU] | Freq: Once | INTRAVENOUS | Status: AC
  Administered 2024-04-06: 3500 [IU] via INTRAVENOUS
  Filled 2024-04-06: qty 3500

## 2024-04-06 MED ORDER — ONDANSETRON HCL 4 MG PO TABS: 4.0000 mg | ORAL_TABLET | Freq: Four times a day (QID) | ORAL | Status: DC | PRN

## 2024-04-06 MED ORDER — NICOTINE 14 MG/24HR TD PT24
14.0000 mg | MEDICATED_PATCH | Freq: Every day | TRANSDERMAL | Status: DC
  Administered 2024-04-06 – 2024-04-07 (×2): 14 mg via TRANSDERMAL
  Filled 2024-04-06 (×2): qty 1

## 2024-04-06 MED ORDER — HYDROXYZINE HCL 10 MG PO TABS: 10.0000 mg | ORAL_TABLET | Freq: Three times a day (TID) | ORAL | Status: DC | PRN

## 2024-04-06 MED ORDER — ACETAMINOPHEN 650 MG RE SUPP: 650.0000 mg | Freq: Four times a day (QID) | RECTAL | Status: DC | PRN

## 2024-04-06 MED ORDER — IOHEXOL 350 MG/ML SOLN
75.0000 mL | Freq: Once | INTRAVENOUS | Status: AC | PRN
  Administered 2024-04-06: 75 mL via INTRAVENOUS

## 2024-04-06 MED ORDER — CLOBAZAM 10 MG PO TABS
10.0000 mg | ORAL_TABLET | Freq: Every day | ORAL | Status: DC
  Administered 2024-04-06: 10 mg via ORAL
  Filled 2024-04-06: qty 1

## 2024-04-06 MED ORDER — IOHEXOL 300 MG/ML  SOLN: 75.0000 mL | Freq: Once | INTRAMUSCULAR | Status: DC | PRN

## 2024-04-06 MED ORDER — ONDANSETRON HCL 4 MG/2ML IJ SOLN: 4.0000 mg | Freq: Four times a day (QID) | INTRAMUSCULAR | Status: DC | PRN

## 2024-04-06 MED ORDER — HYDRALAZINE HCL 20 MG/ML IJ SOLN: 10.0000 mg | INTRAMUSCULAR | Status: DC | PRN

## 2024-04-06 NOTE — Progress Notes (Signed)
 PHARMACY - ANTICOAGULATION CONSULT NOTE  Pharmacy Consult for Heparin  Indication: chest pain/ACS  No Known Allergies  Patient Measurements:   Weight = 69.2 kg (03/10/24)  Vital Signs: Temp: 98.2 F (36.8 C) (12/09 1608) Temp Source: Oral (12/09 1608) BP: 158/92 (12/09 1700) Pulse Rate: 82 (12/09 1700)  Labs: Recent Labs    04/06/24 1615  HGB 15.5*  HCT 47.4*  PLT 233  CREATININE 0.84    CrCl cannot be calculated (Unknown ideal weight.).   Medical History: Past Medical History:  Diagnosis Date   Depression    Hypertension    Sarcoidosis    Seizures (HCC)     Medications:  No oral anticoagulation PTA  Assessment: 58 yr female with shortness of breath and difficulty swallowing. Concerned for possible airway obstruction. Elevated troponin, EKG with T wave changes.  Cardiology consulted and recommended starting IV heparin  and will see patient tomorrow CTAngio = no PE  Goal of Therapy:  Heparin  level 0.3-0.7 units/ml Monitor platelets by anticoagulation protocol: Yes   Plan:  Heparin  3500 units IV bolus x 1 Heparin  gtt @ 800 units/hr Check heparin  level 6 hr after infusion started Daily CBC and heparin  level  Arvin Gauss, PharmD 04/06/2024,8:39 PM

## 2024-04-06 NOTE — ED Notes (Addendum)
 Pt initially noted to be severely ShoB on arrival. Pt was immediately moved to [facility] for assessment. Wheezing noted on inhalation and she c/o [facility]. 12 lead EKG done, IV established, and blood work drawn and sent (light green, lavender, blue top). Once stabilized and triaged, pt was moved to ED RM 5. Report given to RN Dajah.

## 2024-04-06 NOTE — H&P (Signed)
 History and Physical    Martha Schneider:[id number] DOB: 09-25-1965 DOA: 04/06/2024  PCP: Regino Slater, MD  Patient coming from: Home  I have personally briefly reviewed patient's old medical records in [facility]  Chief Complaint: Shortness of breath  HPI: Martha Schneider is a 58 y.o. female with medical history significant for sarcoidosis with emphysema, seizure disorder, chronic leukopenia, HTN, depression who presented to the ED for evaluation of shortness of breath.  Patient has longstanding hoarseness with history of vocal cord polyp.  She says she usually uses an albuterol  inhaler every morning with some benefit.  She says over the last 2 days she has been having anxiety attacks and shortness of breath.  This morning she did not use her albuterol  inhaler.  She had worsening shortness of breath with upper airway wheezing and increased cough.  She has not had any chest pain, fevers, chills, diaphoresis, nausea, vomiting.  ED Course  Labs/Imaging on admission: I have personally reviewed following labs and imaging studies.  Initial vitals showed BP 161/79, pulse 82, RR 26, temp 98.2 F, SpO2 100% on nonrebreather.  Labs showed WBC 2.9, hemoglobin 15.5, platelets 233, sodium 139, potassium 4.4, bicarb 28, BUN 8, creatinine 0.84, serum glucose 108, LFTs within normal limits, magnesium  2.1.  Troponin T 50 > 62.  COVID, influenza, RSV, and group A strep PCR negative.  CTA chest negative for PE or acute intrathoracic pathology.  Emphysematous changes noted.  CT soft tissue neck with contrast negative for evidence of epiglottitis or tonsillitis.  No cervical lymphadenopathy.  Patient was given racemic epinephrine nebulizer, IV Decadron  10 mg with improvement in respiratory status.  She was transition to 2 L supplemental via [place].  Given elevated troponin and EKG changes case was discussed with cardiology who recommended medical admission to [facility] long, start IV heparin , and their team  will see in consultation.  The hospitalist service was consulted for admission.  Review of Systems: All systems reviewed and are negative except as documented in history of present illness above.   Past Medical History:  Diagnosis Date   Depression    Hypertension    Sarcoidosis    Seizures (HCC)     No past surgical history on file.  Social History: Patient reports smoking about 4 cigarettes daily.  No Known Allergies  Family History  Problem Relation Age of Onset   Heart attack Mother    Liver cancer Father    Diabetes Mellitus II Sister    Kidney disease Sister      Prior to Admission medications   Medication Sig Start Date End Date Taking? Authorizing Provider  amLODipine  (NORVASC ) 10 MG tablet Take 10 mg by mouth daily. 01/20/21   [provider]  aspirin  325 MG tablet Take 325 mg by mouth daily.    [provider]  cloBAZam  (ONFI ) 10 MG tablet Take 1 tablet (10 mg total) by mouth at bedtime. 03/10/24 09/06/24  Gregg Lek, MD  diazePAM , 15 MG Dose, (VALTOCO  15 MG DOSE) 2 x 7.5 MG/0.1ML LQPK Place 15 mg into the nose as needed (for seizure). Please provide a total of 5 box/10 doses 03/10/24   Gregg Lek, MD  lacosamide  (VIMPAT ) 200 MG TABS tablet Take 1 tablet (200 mg total) by mouth 2 (two) times daily. 03/10/24   Camara, Amadou, MD  levETIRAcetam  (KEPPRA ) 500 MG tablet Take 2 tablets (1,000 mg total) by mouth 2 (two) times daily. 03/10/24   Camara, Amadou, MD  nicotine  (NICODERM CQ  - DOSED  IN MG/24 HOURS) 14 mg/24hr patch Place 1 patch (14 mg total) onto the skin daily. 04/17/22   Claudene Maximino LABOR, MD  sertraline  (ZOLOFT ) 100 MG tablet Take 1 tablet (100 mg total) by mouth daily. 06/04/22 08/18/24  Camara, Amadou, MD  SUMAtriptan  (IMITREX ) 50 MG tablet Take 1 tablet (50 mg total) by mouth daily as needed for migraine (may repeat if migraine persists in 2 hours, not to exceed 2 tablets in 1 day). May repeat in 2 hours if headache persists or recurs.  03/10/24   Camara, Amadou, MD  UNKNOWN TO PATIENT Place 3 drops into both eyes See admin instructions. Unnamed eye drops (otc): Instill 3 drops into both eyes every morning- for irritation and/or allergies    [provider]  verapamil  (VERELAN ) 120 MG 24 hr capsule TAKE 1 CAPSULE (120 MG TOTAL) BY MOUTH AT BEDTIME. 10/13/23   Gregg Lek, MD    Physical Exam: Vitals:   04/06/24 1608 04/06/24 1700 04/06/24 1900 04/06/24 2234  BP: (!) 161/79 (!) 158/92 (!) 169/97 (!) 128/112  Pulse: 82 82 84 78  Resp: (!) 26 20 (!) 28 18  Temp: 98.2 F (36.8 C)   98.2 F (36.8 C)  TempSrc: Oral   Oral  SpO2: 100% 100% 98% 99%   Constitutional: Sitting up in chair.  NAD, calm, comfortable Eyes: EOMI, lids and conjunctivae normal ENMT: Hoarse voice with intermittent upper airway wheeze.  Mucous membranes are moist. Posterior pharynx clear of any exudate or lesions.Normal dentition.  Neck: normal, supple, no masses. Respiratory: Hoarse voice with intermittent upper airway wheeze but lungs otherwise clear to auscultation.  Normal respiratory effort. No accessory muscle use.  Cardiovascular: Regular rate and rhythm, no murmurs / rubs / gallops. No extremity edema. 2+ pedal pulses. Abdomen: no tenderness, no masses palpated. Musculoskeletal: no clubbing / cyanosis. No joint deformity upper and lower extremities. Good ROM, no contractures. Normal muscle tone.  Skin: no rashes, lesions, ulcers. No induration Neurologic: Sensation intact. Strength 5/5 in all 4.  Psychiatric: Normal judgment and insight. Alert and oriented x 3. Normal mood.   EKG: Personally reviewed. Sinus rhythm, rate 80, first-degree AV block, T wave inversions inferolateral leads.  T wave changes are new when compared to previous.  Assessment/Plan Principal Problem:   Elevated troponin Active Problems:   Chronic leukopenia   Hypertension   Depression   Seizure disorder (HCC)   Sarcoidosis   Martha Schneider is a 59 y.o.  female with medical history significant for sarcoidosis with emphysema, seizure disorder, chronic leukopenia, HTN, depression who is admitted for evaluation of elevated troponin with EKG changes.  Assessment and Plan: Elevated troponin with EKG changes: Patient with mild troponin elevation 50 > 62 and new T wave changes on EKG.  She has not had any chest pain.  Cardiology has recommended admission for further workup and starting on IV heparin . - Continue IV heparin  - Continue aspirin  325 mg daily - Repeat troponin level - Keep on telemetry, repeat EKG in a.m. - Obtain echocardiogram - Cardiology to consult  Sarcoidosis/emphysema: Overall appears to be stable.  Patient has chronic hoarse voice with some intermittent upper airway wheeze.  CTA chest and CT soft tissue neck are reassuring.  Continue albuterol  nebulizer as needed.  Seizure disorder: Continue home Keppra , Vimpat , clobazam .  Hypertension: Continue amlodipine .  Chronic leukopenia: Stable.  Has been evaluated by hematology in the past, felt to be benign ethnic neutropenia versus related to sarcoidosis.  Obtain differential with labs in AM.  Depression: Continue sertraline .   DVT prophylaxis: IV heparin  Code Status: Full code Family Communication: Husband at bedside Disposition Plan: From home, dispo pending clinical progress Consults called: Cardiology Severity of Illness: The appropriate patient status for this patient is OBSERVATION. Observation status is judged to be reasonable and necessary in order to provide the required intensity of service to ensure the patient's safety. The patient's presenting symptoms, physical exam findings, and initial radiographic and laboratory data in the context of their medical condition is felt to place them at decreased risk for further clinical deterioration. Furthermore, it is anticipated that the patient will be medically stable for discharge from the hospital within 2 midnights of  admission.   Jorie Blanch MD [facility]  If 7PM-7AM, please contact night-coverage [email or web address]  04/06/2024, 10:35 PM

## 2024-04-06 NOTE — Hospital Course (Signed)
 Martha Schneider is a 58 y.o. female with medical history significant for sarcoidosis with emphysema, seizure disorder, chronic leukopenia, HTN, depression who is admitted for evaluation of elevated troponin with EKG changes.

## 2024-04-06 NOTE — ED Triage Notes (Addendum)
 PT was noted to have increased work of breathing, and appeared to be in respiratory distress. Pt was placed on NRBM upon arrival.   Pt arrives to triage via wheelchair with complaints of shortness of breath that began 4 days ago and worsened. Husband states that today he arrived home and found the pt wheezing. Pt reports being a smoker.

## 2024-04-06 NOTE — ED Provider Notes (Signed)
 [place] EMERGENCY DEPARTMENT AT [facility] Provider Note   CSN: [id number] Arrival date & time: 04/06/24  [id number]     Patient presents with: Shortness of Breath and Wheezing   Martha Schneider is a 58 y.o. female.   58 year old female presents short of breath times several days.  Had URI symptoms a few days ago and over the last 24 hours has increasing trouble swallowing as well as dyspnea exertion.  Does have remote history of asthma in the past.  Denies any recent fevers or chills.  She has had increased cough.  No vomiting or diarrhea.  Patient is a smoker       Prior to Admission medications   Medication Sig Start Date End Date Taking? Authorizing Provider  amLODipine  (NORVASC ) 10 MG tablet Take 10 mg by mouth daily. 01/20/21   [provider]  aspirin  325 MG tablet Take 325 mg by mouth daily.    [provider]  cloBAZam  (ONFI ) 10 MG tablet Take 1 tablet (10 mg total) by mouth at bedtime. 03/10/24 09/06/24  Gregg Lek, MD  diazePAM , 15 MG Dose, (VALTOCO  15 MG DOSE) 2 x 7.5 MG/0.1ML LQPK Place 15 mg into the nose as needed (for seizure). Please provide a total of 5 box/10 doses 03/10/24   Gregg Lek, MD  lacosamide  (VIMPAT ) 200 MG TABS tablet Take 1 tablet (200 mg total) by mouth 2 (two) times daily. 03/10/24   Camara, Amadou, MD  levETIRAcetam  (KEPPRA ) 500 MG tablet Take 2 tablets (1,000 mg total) by mouth 2 (two) times daily. 03/10/24   Camara, Amadou, MD  nicotine  (NICODERM CQ  - DOSED IN MG/24 HOURS) 14 mg/24hr patch Place 1 patch (14 mg total) onto the skin daily. 04/17/22   Claudene Maximino LABOR, MD  sertraline  (ZOLOFT ) 100 MG tablet Take 1 tablet (100 mg total) by mouth daily. 06/04/22 08/18/24  Camara, Amadou, MD  SUMAtriptan  (IMITREX ) 50 MG tablet Take 1 tablet (50 mg total) by mouth daily as needed for migraine (may repeat if migraine persists in 2 hours, not to exceed 2 tablets in 1 day). May repeat in 2 hours if headache persists or recurs.  03/10/24   Camara, Amadou, MD  UNKNOWN TO PATIENT Place 3 drops into both eyes See admin instructions. Unnamed eye drops (otc): Instill 3 drops into both eyes every morning- for irritation and/or allergies    [provider]  verapamil  (VERELAN ) 120 MG 24 hr capsule TAKE 1 CAPSULE (120 MG TOTAL) BY MOUTH AT BEDTIME. 10/13/23   Gregg Lek, MD    Allergies: Patient has no known allergies.    Review of Systems  All other systems reviewed and are negative.   Updated Vital Signs BP (!) 161/79 (BP Location: Left Arm)   Pulse 82   Temp 98.2 F (36.8 C) (Oral)   Resp (!) 26   SpO2 100%   Physical Exam Vitals and nursing note reviewed.  Constitutional:      General: She is not in acute distress.    Appearance: Normal appearance. She is well-developed. She is not toxic-appearing.  HENT:     Head: Normocephalic and atraumatic.  Eyes:     General: Lids are normal.     Conjunctiva/sclera: Conjunctivae normal.     Pupils: Pupils are equal, round, and reactive to light.  Neck:     Thyroid : No thyroid  mass.     Trachea: No tracheal deviation.  Cardiovascular:     Rate and Rhythm: Normal rate and regular rhythm.  Heart sounds: Normal heart sounds. No murmur heard.    No gallop.  Pulmonary:     Effort: Tachypnea and respiratory distress present.     Breath sounds: Decreased air movement and transmitted upper airway sounds present. Examination of the right-upper field reveals decreased breath sounds and wheezing. Examination of the left-upper field reveals decreased breath sounds and wheezing. Decreased breath sounds and wheezing present. No rhonchi or rales.     Comments: Some stridor noted Abdominal:     General: There is no distension.     Palpations: Abdomen is soft.     Tenderness: There is no abdominal tenderness. There is no rebound.  Musculoskeletal:        General: No tenderness. Normal range of motion.     Cervical back: Normal range of motion and neck supple.   Skin:    General: Skin is warm and dry.     Findings: No abrasion or rash.  Neurological:     Mental Status: She is alert and oriented to person, place, and time. Mental status is at baseline.     GCS: GCS eye subscore is 4. GCS verbal subscore is 5. GCS motor subscore is 6.     Cranial Nerves: No cranial nerve deficit.     Sensory: No sensory deficit.     Motor: Motor function is intact.  Psychiatric:        Attention and Perception: Attention normal.        Speech: Speech normal.        Behavior: Behavior normal.     (all labs ordered are listed, but only abnormal results are displayed) Labs Reviewed  RESP PANEL BY RT-PCR (RSV, FLU A&B, COVID)  [id number]  CBC  COMPREHENSIVE METABOLIC PANEL WITH GFR  I-STAT CHEM 8, ED  TROPONIN T, HIGH SENSITIVITY    EKG: EKG Interpretation Date/Time:  Tuesday April 06 2024 16:32:52 EST Ventricular Rate:  73 PR Interval:  233 QRS Duration:  104 QT Interval:  462 QTC Calculation: 510 R Axis:   -79  Text Interpretation: Sinus rhythm Prolonged PR interval Probable left atrial enlargement Left anterior fascicular block Abnormal T, consider ischemia, diffuse leads ST elevation, consider inferior injury Confirmed by Dasie Faden ([id number]) on 04/06/2024 4:38:37 PM  Radiology: No results found.   Procedures   Medications Ordered in the ED  dexamethasone  (DECADRON ) injection 10 mg (has no administration in time range)  Racepinephrine HCl 2.25 % nebulizer solution 0.5 mL (has no administration in time range)                                    Medical Decision Making Amount and/or Complexity of Data Reviewed Labs: ordered. Radiology: ordered. ECG/medicine tests: ordered.  Risk OTC drugs. Prescription drug management.  Patient is EKG does show some new lateral T wave changes.  Concern for possible airway obstruction.  Patient given racemic epinephrine and does feel better.  Patient monitored very closely multiple rechecks and  patient's airway remained stable.  She does have some hoarseness.  She did have initially some stridor which improved.  First opponent came back elevated and second 1 even more so.  Had 2 repeat EKGs which remained stable.  Her QT is prolonged as well.  Chest x-ray without acute findings.  Due to concern for possible upper airway obstruction she had a CT of her soft tissue neck as well as CT angio chest both of  which did not show any acute findings.  COVID and flu test negative.  Due to patient's elevated troponins, case discussed with cardiology fellow on-call.  Recommend patient started on heparin  and given aspirin  and can be admitted here at [facility].  They will see patient tomorrow and order echo and probable cath sometime during admission.  Will consult hospitalist team  CRITICAL CARE Performed by: Curtistine ONEIDA Dawn Total critical care time: 75 minutes Critical care time was exclusive of separately billable procedures and treating other patients. Critical care was necessary to treat or prevent imminent or life-threatening deterioration. Critical care was time spent personally by me on the following activities: development of treatment plan with patient and/or surrogate as well as nursing, discussions with consultants, evaluation of patient's response to treatment, examination of patient, obtaining history from patient or surrogate, ordering and performing treatments and interventions, ordering and review of laboratory studies, ordering and review of radiographic studies, pulse oximetry and re-evaluation of patient's condition.       Final diagnoses:  None    ED Discharge Orders     None          Dawn Curtistine, MD 04/06/24 2012

## 2024-04-06 NOTE — ED Notes (Signed)
 Patient transported to CT

## 2024-04-07 ENCOUNTER — Observation Stay (HOSPITAL_COMMUNITY)

## 2024-04-07 ENCOUNTER — Encounter (HOSPITAL_COMMUNITY)
Admission: EM | Discharge status: Against Medical Advice | Payer: Self-pay | Source: Home / Self Care | Attending: Emergency Medicine

## 2024-04-07 DIAGNOSIS — R0602 Shortness of breath: Secondary | ICD-10-CM | POA: Diagnosis not present

## 2024-04-07 DIAGNOSIS — R7989 Other specified abnormal findings of blood chemistry: Secondary | ICD-10-CM | POA: Diagnosis not present

## 2024-04-07 DIAGNOSIS — I214 Non-ST elevation (NSTEMI) myocardial infarction: Secondary | ICD-10-CM | POA: Diagnosis not present

## 2024-04-07 HISTORY — PX: RIGHT/LEFT HEART CATH AND CORONARY ANGIOGRAPHY: CATH118266

## 2024-04-07 LAB — CBC WITH DIFFERENTIAL/PLATELET
Abs Immature Granulocytes: 0.01 K/uL (ref 0.00–0.07)
Basophils Absolute: 0 K/uL (ref 0.0–0.1)
Basophils Relative: 0 %
Eosinophils Absolute: 0 K/uL (ref 0.0–0.5)
Eosinophils Relative: 0 %
HCT: 43.4 % (ref 36.0–46.0)
Hemoglobin: 14.4 g/dL (ref 12.0–15.0)
Immature Granulocytes: 0 %
Lymphocytes Relative: 21 %
Lymphs Abs: 0.7 K/uL (ref 0.7–4.0)
MCH: 29.2 pg (ref 26.0–34.0)
MCHC: 33.2 g/dL (ref 30.0–36.0)
MCV: 88 fL (ref 80.0–100.0)
Monocytes Absolute: 0.3 K/uL (ref 0.1–1.0)
Monocytes Relative: 9 %
Neutro Abs: 2.2 K/uL (ref 1.7–7.7)
Neutrophils Relative %: 70 %
Platelets: 249 K/uL (ref 150–400)
RBC: 4.93 MIL/uL (ref 3.87–5.11)
RDW: 14.2 % (ref 11.5–15.5)
WBC: 3.1 K/uL — ABNORMAL LOW (ref 4.0–10.5)
nRBC: 0 % (ref 0.0–0.2)

## 2024-04-07 LAB — ECHOCARDIOGRAM COMPLETE
Area-P 1/2: 3.59 cm2
Calc EF: 52.9 %
Height: 63 in
P 1/2 time: 579 ms
S' Lateral: 2.7 cm
Single Plane A2C EF: 49.3 %
Single Plane A4C EF: 54.3 %
Weight: 2419.77 [oz_av]

## 2024-04-07 LAB — POCT I-STAT EG7
Acid-Base Excess: 1 mmol/L (ref 0.0–2.0)
Acid-Base Excess: 2 mmol/L (ref 0.0–2.0)
Bicarbonate: 26.9 mmol/L (ref 20.0–28.0)
Bicarbonate: 27.8 mmol/L (ref 20.0–28.0)
Calcium, Ion: 1.17 mmol/L (ref 1.15–1.40)
Calcium, Ion: 1.18 mmol/L (ref 1.15–1.40)
HCT: 40 % (ref 36.0–46.0)
HCT: 40 % (ref 36.0–46.0)
Hemoglobin: 13.6 g/dL (ref 12.0–15.0)
Hemoglobin: 13.6 g/dL (ref 12.0–15.0)
O2 Saturation: 69 %
O2 Saturation: 70 %
Potassium: 3.7 mmol/L (ref 3.5–5.1)
Potassium: 3.7 mmol/L (ref 3.5–5.1)
Sodium: 134 mmol/L — ABNORMAL LOW (ref 135–145)
Sodium: 135 mmol/L (ref 135–145)
TCO2: 28 mmol/L (ref 22–32)
TCO2: 29 mmol/L (ref 22–32)
pCO2, Ven: 47 mmHg (ref 44–60)
pCO2, Ven: 47.5 mmHg (ref 44–60)
pH, Ven: 7.36 (ref 7.25–7.43)
pH, Ven: 7.379 (ref 7.25–7.43)
pO2, Ven: 37 mmHg (ref 32–45)
pO2, Ven: 38 mmHg (ref 32–45)

## 2024-04-07 LAB — HIV ANTIBODY (ROUTINE TESTING W REFLEX): HIV Screen 4th Generation wRfx: NONREACTIVE

## 2024-04-07 LAB — POCT I-STAT 7, (LYTES, BLD GAS, ICA,H+H)
Acid-Base Excess: 1 mmol/L (ref 0.0–2.0)
Bicarbonate: 26.1 mmol/L (ref 20.0–28.0)
Calcium, Ion: 1.15 mmol/L (ref 1.15–1.40)
HCT: 39 % (ref 36.0–46.0)
Hemoglobin: 13.3 g/dL (ref 12.0–15.0)
O2 Saturation: 94 %
Potassium: 3.7 mmol/L (ref 3.5–5.1)
Sodium: 136 mmol/L (ref 135–145)
TCO2: 27 mmol/L (ref 22–32)
pCO2 arterial: 42.2 mmHg (ref 32–48)
pH, Arterial: 7.4 (ref 7.35–7.45)
pO2, Arterial: 71 mmHg — ABNORMAL LOW (ref 83–108)

## 2024-04-07 LAB — POCT I-STAT, CHEM 8
BUN: 8 mg/dL (ref 6–20)
Calcium, Ion: 1.06 mmol/L — ABNORMAL LOW (ref 1.15–1.40)
Chloride: 99 mmol/L (ref 98–111)
Creatinine, Ser: 0.9 mg/dL (ref 0.44–1.00)
Glucose, Bld: 104 mg/dL — ABNORMAL HIGH (ref 70–99)
HCT: 45 % (ref 36.0–46.0)
Hemoglobin: 15.3 g/dL — ABNORMAL HIGH (ref 12.0–15.0)
Potassium: 4.3 mmol/L (ref 3.5–5.1)
Sodium: 139 mmol/L (ref 135–145)
TCO2: 29 mmol/L (ref 22–32)

## 2024-04-07 LAB — HEPARIN LEVEL (UNFRACTIONATED)
Heparin Unfractionated: 0.38 [IU]/mL (ref 0.30–0.70)
Heparin Unfractionated: 0.27 [IU]/mL — ABNORMAL LOW (ref 0.30–0.70)

## 2024-04-07 LAB — BASIC METABOLIC PANEL WITH GFR
Anion gap: 12 (ref 5–15)
BUN: 16 mg/dL (ref 6–20)
CO2: 25 mmol/L (ref 22–32)
Calcium: 9.1 mg/dL (ref 8.9–10.3)
Chloride: 99 mmol/L (ref 98–111)
Creatinine, Ser: 0.99 mg/dL (ref 0.44–1.00)
GFR, Estimated: >60 mL/min (ref >60)
Glucose, Bld: 190 mg/dL — ABNORMAL HIGH (ref 70–99)
Potassium: 3.9 mmol/L (ref 3.5–5.1)
Sodium: 136 mmol/L (ref 135–145)

## 2024-04-07 MED ORDER — MIDAZOLAM HCL 2 MG/2ML IJ SOLN
INTRAMUSCULAR | Status: AC
  Filled 2024-04-07: qty 2

## 2024-04-07 MED ORDER — BUDESONIDE 0.5 MG/2ML IN SUSP
0.5000 mg | Freq: Two times a day (BID) | RESPIRATORY_TRACT | Status: DC
  Administered 2024-04-07: 0.5 mg via RESPIRATORY_TRACT
  Filled 2024-04-07: qty 2

## 2024-04-07 MED ORDER — ATORVASTATIN CALCIUM 40 MG PO TABS
80.0000 mg | ORAL_TABLET | Freq: Every day | ORAL | Status: DC
  Administered 2024-04-07: 80 mg via ORAL
  Filled 2024-04-07: qty 2

## 2024-04-07 MED ORDER — IPRATROPIUM-ALBUTEROL 0.5-2.5 (3) MG/3ML IN SOLN
3.0000 mL | Freq: Four times a day (QID) | RESPIRATORY_TRACT | Status: DC
  Administered 2024-04-07: 3 mL via RESPIRATORY_TRACT
  Filled 2024-04-07: qty 3

## 2024-04-07 MED ORDER — FREE WATER: 500.0000 mL | Freq: Once | Status: DC

## 2024-04-07 MED ORDER — LIDOCAINE HCL (PF) 1 % IJ SOLN
INTRAMUSCULAR | Status: DC | PRN
  Administered 2024-04-07 (×2): 2 mL

## 2024-04-07 MED ORDER — HEPARIN SODIUM (PORCINE) 1000 UNIT/ML IJ SOLN
INTRAMUSCULAR | Status: AC
  Filled 2024-04-07: qty 10

## 2024-04-07 MED ORDER — VERAPAMIL HCL 2.5 MG/ML IV SOLN
INTRAVENOUS | Status: DC | PRN
  Administered 2024-04-07: 10 mL via INTRA_ARTERIAL

## 2024-04-07 MED ORDER — DEXAMETHASONE 6 MG PO TABS: 6.0000 mg | ORAL_TABLET | Freq: Every day | ORAL | Status: DC

## 2024-04-07 MED ORDER — HEPARIN (PORCINE) IN NACL 2000-0.9 UNIT/L-% IV SOLN
INTRAVENOUS | Status: DC | PRN
  Administered 2024-04-07: 1000 mL

## 2024-04-07 MED ORDER — DEXAMETHASONE 4 MG PO TABS: 6.0000 mg | ORAL_TABLET | Freq: Every day | ORAL | Status: DC

## 2024-04-07 MED ORDER — LIDOCAINE HCL (PF) 1 % IJ SOLN
INTRAMUSCULAR | Status: AC
  Filled 2024-04-07: qty 30

## 2024-04-07 MED ORDER — PERFLUTREN LIPID MICROSPHERE
1.0000 mL | INTRAVENOUS | Status: AC | PRN
  Administered 2024-04-07: 2 mL via INTRAVENOUS

## 2024-04-07 MED ORDER — VERAPAMIL HCL 2.5 MG/ML IV SOLN
INTRAVENOUS | Status: AC
  Filled 2024-04-07: qty 2

## 2024-04-07 MED ORDER — FENTANYL CITRATE (PF) 100 MCG/2ML IJ SOLN
INTRAMUSCULAR | Status: DC | PRN
  Administered 2024-04-07: 25 ug via INTRAVENOUS

## 2024-04-07 MED ORDER — IOHEXOL 350 MG/ML SOLN
INTRAVENOUS | Status: DC | PRN
  Administered 2024-04-07: 45 mL

## 2024-04-07 MED ORDER — ASPIRIN 81 MG PO CHEW: 81.0000 mg | CHEWABLE_TABLET | ORAL | Status: DC

## 2024-04-07 MED ORDER — HEPARIN SODIUM (PORCINE) 1000 UNIT/ML IJ SOLN
INTRAMUSCULAR | Status: DC | PRN
  Administered 2024-04-07: 3500 [IU] via INTRAVENOUS

## 2024-04-07 MED ORDER — ASPIRIN 81 MG PO TBEC
81.0000 mg | DELAYED_RELEASE_TABLET | Freq: Every day | ORAL | Status: DC
  Administered 2024-04-07: 81 mg via ORAL
  Filled 2024-04-07: qty 1

## 2024-04-07 MED ORDER — MIDAZOLAM HCL (PF) 2 MG/2ML IJ SOLN
INTRAMUSCULAR | Status: DC | PRN
  Administered 2024-04-07: 1 mg via INTRAVENOUS

## 2024-04-07 MED ORDER — DEXAMETHASONE 4 MG PO TABS
6.0000 mg | ORAL_TABLET | Freq: Every day | ORAL | Status: DC
  Administered 2024-04-07: 6 mg via ORAL
  Filled 2024-04-07: qty 1

## 2024-04-07 MED ORDER — FENTANYL CITRATE (PF) 100 MCG/2ML IJ SOLN
INTRAMUSCULAR | Status: AC
  Filled 2024-04-07: qty 2

## 2024-04-07 NOTE — Consult Note (Signed)
 Cardiology Consultation   Patient ID: [name] MRN: [id number]; DOB: [birth year]  Admit date: 04/06/2024 Date of Consult: 04/07/2024  PCP:  Regino Slater, MD    HeartCare Providers Cardiologist:  None      Patient Profile: Amyah Clawson is a 58 y.o. female with a hx of tobacco abuse, sarcoidosis with emphysema, hypertension, chronic leukopenia, and seizures who is being seen 04/07/2024 for the evaluation of shortness of breath at the request of Ivonne Mustache MD.  History of Present Illness: [name] is a 58 year old female who per chart review has not previously been seen by cardiology. Patient presented to the hospital for shortness of breath, wheezing, and increased cough for 1 week.  Apparently last week she started develop cough.  Over the past 2 to 3 days has been more short of breath.  She reports no chest pain or pressure and no chest tightness.  She has never had any heart issues.  She has COPD and sarcoidosis.  She is never had cardiac involvement.  On arrival to [facility] she is been hemodynamically stable was initially hypertensive.  EKG shows diffuse inferolateral T wave inversions.  CT PE study negative.  No evidence of congestive heart failure.  High-sensitivity troponins 50 up to 62 and down to 49.  She has chronic leukopenia.  She was started on a heparin  drip given abnormal EKG.  Echo obtained shows an EF of 40 to 45% with mid to apical hypokinesis of the inferior wall.  This is concerning for RCA stenosis.   Past Medical History:  Diagnosis Date   Depression    Hypertension    Sarcoidosis    Seizures (HCC)     No past surgical history on file.   Home Medications:  Prior to Admission medications   Medication Sig Start Date End Date Taking? Authorizing Provider  acetaminophen  (TYLENOL ) 500 MG tablet Take 500 mg by mouth every 6 (six) hours as needed.   Yes [provider]  amLODipine  (NORVASC ) 10 MG tablet Take 10 mg by mouth daily. 01/20/21   Yes [provider]  aspirin  325 MG tablet Take 325 mg by mouth daily.   Yes [provider]  cloBAZam  (ONFI ) 10 MG tablet Take 1 tablet (10 mg total) by mouth at bedtime. 03/10/24 09/06/24 Yes Camara, Amadou, MD  diazePAM , 15 MG Dose, (VALTOCO  15 MG DOSE) 2 x 7.5 MG/0.1ML LQPK Place 15 mg into the nose as needed (for seizure). Please provide a total of 5 box/10 doses 03/10/24  Yes Camara, Amadou, MD  lacosamide  (VIMPAT ) 200 MG TABS tablet Take 1 tablet (200 mg total) by mouth 2 (two) times daily. 03/10/24  Yes Gregg Lek, MD  levETIRAcetam  (KEPPRA ) 500 MG tablet Take 2 tablets (1,000 mg total) by mouth 2 (two) times daily. 03/10/24  Yes Gregg Lek, MD  sertraline  (ZOLOFT ) 100 MG tablet Take 1 tablet (100 mg total) by mouth daily. 06/04/22 08/18/24 Yes Camara, Amadou, MD  SUMAtriptan  (IMITREX ) 50 MG tablet Take 1 tablet (50 mg total) by mouth daily as needed for migraine (may repeat if migraine persists in 2 hours, not to exceed 2 tablets in 1 day). May repeat in 2 hours if headache persists or recurs. 03/10/24  Yes Gregg Lek, MD  UNKNOWN TO PATIENT Place 3 drops into both eyes See admin instructions. Unnamed eye drops (otc): Instill 3 drops into both eyes every morning- for irritation and/or allergies   Yes [provider]  verapamil  (VERELAN ) 120 MG 24 hr capsule TAKE  1 CAPSULE (120 MG TOTAL) BY MOUTH AT BEDTIME. Patient taking differently: Take 120 mg by mouth at bedtime as needed. 10/13/23  Yes Camara, Amadou, MD  nicotine  (NICODERM CQ  - DOSED IN MG/24 HOURS) 14 mg/24hr patch Place 1 patch (14 mg total) onto the skin daily. Patient not taking: Reported on 04/06/2024 04/17/22   Claudene Maximino LABOR, MD    Scheduled Meds:  amLODipine   10 mg Oral Daily   aspirin   325 mg Oral Daily   cloBAZam   10 mg Oral QHS   dexamethasone   6 mg Oral Daily   lacosamide   200 mg Oral BID   levETIRAcetam   1,000 mg Oral BID   nicotine   14 mg Transdermal Daily   sertraline   100 mg  Oral Daily   sodium chloride  flush  3 mL Intravenous Q12H   Continuous Infusions:  heparin  800 Units/hr (04/07/24 0654)   PRN Meds: acetaminophen  **OR** acetaminophen , albuterol , hydrALAZINE , hydrOXYzine , ondansetron  **OR** ondansetron  (ZOFRAN ) IV, senna-docusate  Allergies:   No Known Allergies  Social History:   Social History   Socioeconomic History   Marital status: Married    Spouse name: Not on file   Number of children: 2   Years of education: Not on file   Highest education level: Not on file  Occupational History   Not on file  Tobacco Use   Smoking status: Some Days    Current packs/day: 0.50    Average packs/day: 0.5 packs/day for 37.0 years (18.5 ttl pk-yrs)    Types: Cigarettes   Smokeless tobacco: Never  Vaping Use   Vaping status: Never Used  Substance and Sexual Activity   Alcohol use: Yes    Comment: occasionally   Drug use: No   Sexual activity: Not on file  Other Topics Concern   Not on file  Social History Narrative   Not on file   Social Drivers of Health   Financial Resource Strain: Not on file  Food Insecurity: Food Insecurity Present (04/06/2024)   Hunger Vital Sign    Worried About Running Out of Food in the Last Year: Often true    Ran Out of Food in the Last Year: Often true  Transportation Needs: No Transportation Needs (04/06/2024)   PRAPARE - Administrator, Civil Service (Medical): No    Lack of Transportation (Non-Medical): No  Physical Activity: Not on file  Stress: Not on file  Social Connections: Not on file  Intimate Partner Violence: Not At Risk (04/06/2024)   Humiliation, Afraid, Rape, and Kick questionnaire    Fear of Current or Ex-Partner: No    Emotionally Abused: No    Physically Abused: No    Sexually Abused: No    Family History:    Family History  Problem Relation Age of Onset   Heart attack Mother    Liver cancer Father    Diabetes Mellitus II Sister    Kidney disease Sister      ROS:  Please  see the history of present illness.   All other ROS reviewed and negative.     Physical Exam/Data: Vitals:   04/06/24 2234 04/06/24 2300 04/07/24 0658 04/07/24 1030  BP: (!) 128/112  129/85 118/76  Pulse: 78  75 74  Resp: 18  18 20   Temp: 98.2 F (36.8 C)  99 F (37.2 C) 99.1 F (37.3 C)  TempSrc: Oral   Oral  SpO2: 99%  100% 100%  Weight:  68.6 kg    Height:  5' 3 (  1.6 m)      Intake/Output Summary (Last 24 hours) at 04/07/2024 1111 Last data filed at 04/07/2024 0654 Gross per 24 hour  Intake 113.89 ml  Output --  Net 113.89 ml      04/06/2024   11:00 PM 03/10/2024   10:02 AM 08/19/2023   11:40 AM  Last 3 Weights  Weight (lbs) 151 lb 3.8 oz 152 lb 8 oz 153 lb 8 oz  Weight (kg) 68.6 kg 69.174 kg 69.627 kg     Body mass index is 26.79 kg/m.  General:  Well nourished, well developed, in no acute distress HEENT: normal Neck: no JVD Vascular: No carotid bruits; Distal pulses 2+ bilaterally Cardiac:  normal S1, S2; RRR; no murmur  Lungs: Diminished breath sounds bilaterally Abd: soft, nontender, no hepatomegaly  Ext: no edema Musculoskeletal:  No deformities, BUE and BLE strength normal and equal Skin: warm and dry  Neuro:  CNs 2-12 intact, no focal abnormalities noted Psych:  Normal affect   EKG:  The EKG was personally reviewed and demonstrates: Sinus rhythm 80, inferolateral T wave inversions Telemetry:  Telemetry was personally reviewed and demonstrates: Sinus rhythm 70 to 80 bpm, PVCs noted  Relevant CV Studies: LVEF 40 to 45%, mid to apical inferior hypokinesis on my review, moderate AI  Laboratory Data: High Sensitivity Troponin:  No results for input(s): TROPONINIHS in the last 720 hours.   Chemistry Recent Labs  Lab 04/06/24 1615 04/06/24 1706 04/06/24 1829 04/07/24 0339  NA 139 139  --  136  K 4.4 4.3  --  3.9  CL 99 99  --  99  CO2 28  --   --  25  GLUCOSE 108* 104*  --  190*  BUN 8 8  --  16  CREATININE 0.84 0.90  --  0.99  CALCIUM  9.7   --   --  9.1  MG  --   --  2.1  --   GFRNONAA >60  --   --  >60  ANIONGAP 13  --   --  12    Recent Labs  Lab 04/06/24 1615  PROT 8.9*  ALBUMIN 4.7  AST 39  ALT 17  ALKPHOS 101  BILITOT 0.6   Lipids No results for input(s): CHOL, TRIG, HDL, LABVLDL, LDLCALC, CHOLHDL in the last 168 hours.  Hematology Recent Labs  Lab 04/06/24 1615 04/06/24 1706 04/07/24 0339  WBC 2.9*  --  3.1*  RBC 5.28*  --  4.93  HGB 15.5* 15.3* 14.4  HCT 47.4* 45.0 43.4  MCV 89.8  --  88.0  MCH 29.4  --  29.2  MCHC 32.7  --  33.2  RDW 14.3  --  14.2  PLT 233  --  249   Thyroid  No results for input(s): TSH, FREET4 in the last 168 hours.  BNPNo results for input(s): BNP, PROBNP in the last 168 hours.  DDimer No results for input(s): DDIMER in the last 168 hours.  Radiology/Studies:  CT Soft Tissue Neck W Contrast Result Date: 04/06/2024 EXAM: CT NECK WITH CONTRAST 04/06/2024 06:59:09 PM TECHNIQUE: CT of the neck was performed with the administration of 75 mL of iohexol  (OMNIPAQUE ) 350 MG/ML injection. Multiplanar reformatted images are provided for review. Automated exposure control, iterative reconstruction, and/or weight based adjustment of the mA/kV was utilized to reduce the radiation dose to as low as reasonably achievable. COMPARISON: Same day neck soft tissue radiograph. CLINICAL HISTORY: Epiglottitis or tonsillitis suspected. FINDINGS: AERODIGESTIVE TRACT: The nasopharynx is symmetric.  Retropharyngeal course of the cervical internal carotid arteries which indents the posterior wall of the oropharynx. The oropharynx is otherwise unremarkable. Symmetric appearance of the tonsils. No evidence of peritonsillar abscess. Normal appearance of the oral cavity and floor of mouth. Normal appearance of the base of tongue and epiglottis. No retropharyngeal fluid collection. The supraglottic airway is relatively symmetric. Symmetric appearance of the vocal folds. SALIVARY GLANDS: The parotid  and submandibular glands are unremarkable. THYROID : Unremarkable. LYMPH NODES: No suspicious cervical lymphadenopathy. SOFT TISSUES: No mass or fluid collection. BRAIN, ORBITS, SINUSES AND MASTOIDS: Visualized portions of the paranasal sinuses and mastoid air cells are clear. No acute abnormality. LUNGS AND MEDIASTINUM: Paraseptal emphysema at the right lung apex. No acute abnormality. BONES: Degenerative changes in the visualized spine. No focal bone abnormality. IMPRESSION: 1. No CT evidence of epiglottitis or tonsillitis. 2. No cervical lymphadenopathy. Electronically signed by: Donnice Mania MD 04/06/2024 07:23 PM EST RP Workstation: [id number]   CT Angio Chest PE W/Cm &/Or Wo Cm Result Date: 04/06/2024 CLINICAL DATA:  Shortness of breath. Concern for pulmonary embolism. EXAM: CT ANGIOGRAPHY CHEST WITH CONTRAST TECHNIQUE: Multidetector CT imaging of the chest was performed using the standard protocol during bolus administration of intravenous contrast. Multiplanar CT image reconstructions and MIPs were obtained to evaluate the vascular anatomy. RADIATION DOSE REDUCTION: This exam was performed according to the departmental dose-optimization program which includes automated exposure control, adjustment of the mA and/or kV according to patient size and/or use of iterative reconstruction technique. CONTRAST:  75mL OMNIPAQUE  IOHEXOL  350 MG/ML SOLN COMPARISON:  CT dated 11/10/2019. Chest radiograph dated 04/06/2024. FINDINGS: Cardiovascular: There is no cardiomegaly or pericardial effusion. The thoracic aorta is unremarkable. Mild dilatation of the main pulmonary trunk suggestive of pulmonary hypertension. Clinical correlation recommended. No pulmonary artery embolus identified. Mediastinum/Nodes: No hilar or mediastinal adenopathy. The esophagus is grossly unremarkable. No mediastinal fluid collection. Lungs/Pleura: Background of emphysema. Bibasilar subpleural atelectasis/scarring. There is an 8 mm left lower  lobe nodule along the fissure present on the prior CT of 2021. Right upper lobe reticular scarring. No consolidative changes. There is no pleural effusion pneumothorax. The central airways are patent. Upper Abdomen: No acute abnormality. Musculoskeletal: No acute osseous pathology. Review of the MIP images confirms the above findings. IMPRESSION: 1. No acute intrathoracic pathology. No CT evidence of pulmonary artery embolus. 2.  Emphysema (ICD10-J43.9). Electronically Signed   By: Vanetta Chou M.D.   On: 04/06/2024 19:16   DG Neck Soft Tissue Result Date: 04/06/2024 EXAM: 1 VIEW(S) XRAY OF THE SOFT TISSUE NECK 04/06/2024 05:17:00 PM COMPARISON: None available. CLINICAL HISTORY: SOB (shortness of breath). FINDINGS: SOFT TISSUES: Prevertebral soft tissue are unremarkable. EPIGLOTTIS: No epiglottic thickening. BONES: Diffuse degenerative disc disease throughout the cervical spine. IMPRESSION: 1. No acute findings. Electronically signed by: Franky Crease MD 04/06/2024 05:51 PM EST RP Workstation: [id number]   DG Chest Port 1 View Result Date: 04/06/2024 EXAM: 1 VIEW(S) XRAY OF THE CHEST 04/06/2024 05:17:00 PM COMPARISON: 11/25/2022. CLINICAL HISTORY: sob sob FINDINGS: LUNGS AND PLEURA: No focal pulmonary opacity. No pleural effusion. No pneumothorax. HEART AND MEDIASTINUM: No acute abnormality of the cardiac and mediastinal silhouettes. BONES AND SOFT TISSUES: No acute osseous abnormality. IMPRESSION: 1. No acute cardiopulmonary process. Electronically signed by: Franky Crease MD 04/06/2024 05:50 PM EST RP Workstation: [id number]     Assessment and Plan:  # Non-STEMI # Inferior/inferolateral WMA - Admitted with cough. EKG with inferolateral TWI. Echo on my review EF 40-45% with inferior/posterior HK. No PE on CT study.  -  No CP - Admitted with cough that is worsened. I question if she had her event 1 week ago.  - On heparin . Continue ASA 81 mg daily. Start lipitor 80 mg daily.  - check A1c, TSH, and  lipids tomorrow.  - discussed need for LHC. She is NPO and will proceed today.   Informed Consent   Shared Decision Making/Informed Consent The risks [stroke (1 in 1000), death (1 in 1000), kidney failure [usually temporary] (1 in 500), bleeding (1 in 200), allergic reaction [possibly serious] (1 in 200)], benefits (diagnostic support and management of coronary artery disease) and alternatives of a cardiac catheterization were discussed in detail with Ms. Wahlstrom and she is willing to proceed.      # COPD # Enlarged pulmonary artery - PA severely dilated on my review. No O2 at home. We will do right heart as well.   # Sarcoidosis - no conduction disease. No evidence of cardiac involvement.    For questions or updates, please contact [facility] Please consult [email or web address] for contact info under   Signed, Darryle T. Barbaraann, MD, [facility]  [facility]  [address] [place], [place] [address] [phone]  12:10 PM

## 2024-04-07 NOTE — TOC Initial Note (Signed)
 Transition of Care [facility]) - Initial/Assessment Note    Patient Details  Name: Martha Schneider MRN: [id number] Date of Birth: April 19, 1966  Transition of Care [facility]) CM/SW Contact:    Doneta Glenys DASEN, RN Phone Number: 04/07/2024, 11:49 AM  Clinical Narrative:                 Home: Inpatient care management will follow for possible oxygen needs.  Expected Discharge Plan: Home/Self Care Barriers to Discharge: Continued Medical Work up   Patient Goals and CMS Choice Patient states their goals for this hospitalization and ongoing recovery are:: Home with spouse CMS Medicare.gov Compare Post Acute Care list provided to::  (NA) Choice offered to / list presented to : NA [place] ownership interest in [facility].provided to:: Parent NA    Expected Discharge Plan and Services In-house Referral: NA Discharge Planning Services: CM Consult   Living arrangements for the past 2 months: Single Family Home                 DME Arranged: N/A DME Agency: NA       HH Arranged: NA HH Agency: NA        Prior Living Arrangements/Services Living arrangements for the past 2 months: Single Family Home Lives with:: Spouse Patient language and need for interpreter reviewed:: Yes Do you feel safe going back to the place where you live?: Yes      Need for Family Participation in Patient Care: Yes (Comment) Care giver support system in place?: Yes (comment) Current home services:  (NA) Criminal Activity/Legal Involvement Pertinent to Current Situation/Hospitalization: No - Comment as needed  Activities of Daily Living   ADL Screening (condition at time of admission) Independently performs ADLs?: Yes (appropriate for developmental age) Is the patient deaf or have difficulty hearing?: No Does the patient have difficulty seeing, even when wearing glasses/contacts?: No Does the patient have difficulty concentrating, remembering, or making decisions?: No  Permission  Sought/Granted Permission sought to share information with : Case Manager Permission granted to share information with : Yes, Verbal Permission Granted  Share Information with NAME: Lack,John (Spouse)  [phone]           Emotional Assessment Appearance:: Appears stated age Attitude/Demeanor/Rapport: Engaged, Gracious Affect (typically observed): Appropriate Orientation: : Oriented to Self, Oriented to Place, Oriented to  Time, Oriented to Situation Alcohol / Substance Use: Not Applicable Psych Involvement: No (comment)  Admission diagnosis:  Elevated troponin [R79.89] NSTEMI (non-ST elevated myocardial infarction) (HCC) [I21.4] Dyspnea, unspecified type [R06.00] Patient Active Problem List   Diagnosis Date Noted   Elevated troponin 04/06/2024   Sarcoidosis 04/06/2024   Alcohol abuse 04/16/2022   Hypomagnesemia 04/16/2022   Transaminitis 04/16/2022   Sore throat 04/16/2022   Seizure disorder (HCC) 04/08/2021   Recurrent seizures (HCC) 04/08/2021   Hypocalcemia 04/08/2021   Hypoxia 04/08/2021   Chronic leukopenia 04/04/2021   Hypertension    Depression    Tobacco abuse    Seizures (HCC) 04/03/2021   Dyspnea on exertion 01/13/2020   Current every day smoker 09/25/2019   Neutropenia 09/25/2019   PCP:  Regino Slater, MD Pharmacy:   [id number] [address], [place] - [address] [place] [address] Phone: [phone] Fax: [phone]     Social Drivers of Health (SDOH) Social History: SDOH Screenings   Food Insecurity: Food Insecurity Present (04/06/2024)  Housing: High Risk (04/06/2024)  Transportation Needs: No Transportation Needs (04/06/2024)  Utilities: At  Risk (04/06/2024)  Tobacco Use: High Risk (03/10/2024)   SDOH Interventions:     Readmission Risk Interventions     No data to display

## 2024-04-07 NOTE — Progress Notes (Signed)
° ° °  Against Medical Advice   Martha Schneider expresses desire to leave the Hospital immediately. Patient has been warned that this is not medically advisable at this time, and can result in medical complications like Death and Disability. Patient understands and accepts the risks involved and assumes full responsibilty of this decision.   This patient has also been advised that if they feel the need for further medical assistance to return to any available ER or dial [phone].  Informed by Nursing staff that this patient is agreeable to signing   Against Medical Advice form on 04/07/2024 at 2016.

## 2024-04-07 NOTE — Progress Notes (Signed)
 TR BAND REMOVAL  LOCATION:    Right radial  DEFLATED PER PROTOCOL:   yes  TIME BAND OFF / DRESSING APPLIED:    1830  SITE UPON ARRIVAL:    Level  0  SITE AFTER BAND REMOVAL:    Level 0  CIRCULATION SENSATION AND MOVEMENT:    Within Normal Limits : yes

## 2024-04-07 NOTE — Progress Notes (Signed)
 PHARMACY - ANTICOAGULATION CONSULT NOTE  Pharmacy Consult for Heparin  Indication: chest pain/ACS  No Known Allergies  Patient Measurements: Height: 5' 3 (160 cm) Weight: 68.6 kg (151 lb 3.8 oz) IBW/kg (Calculated) : 52.4 HEPARIN  DW (KG): 66.4 Weight = 69.2 kg (03/10/24)  Vital Signs: Temp: 98.2 F (36.8 C) (12/09 2234) Temp Source: Oral (12/09 2234) BP: 128/112 (12/09 2234) Pulse Rate: 78 (12/09 2234)  Labs: Recent Labs    04/06/24 1615 04/07/24 0339  HGB 15.5* 14.4  HCT 47.4* 43.4  PLT 233 249  HEPARINUNFRC  --  0.38  CREATININE 0.84  --     Estimated Creatinine Clearance: 67.1 mL/min (by C-G formula based on SCr of 0.84 mg/dL).   Medical History: Past Medical History:  Diagnosis Date   Depression    Hypertension    Sarcoidosis    Seizures (HCC)     Medications:  No oral anticoagulation PTA  Assessment: 58 yr female with shortness of breath and difficulty swallowing. Concerned for possible airway obstruction. Elevated troponin, EKG with T wave changes.  Cardiology consulted and recommended starting IV heparin  and will see patient tomorrow CTAngio = no PE  04/07/2024 Heparin  level 0.38 therapeutic on 800 units/hr CBC WNL No bleeding reported   Goal of Therapy:  Heparin  level 0.3-0.7 units/ml Monitor platelets by anticoagulation protocol: Yes   Plan:  Continue Heparin  gtt @ 800 units/hr Confirmatory heparin  level in 6 hours Daily CBC and heparin  level  Leeroy Mace RPh 04/07/2024, 4:19 AM

## 2024-04-07 NOTE — Interval H&P Note (Signed)
 History and Physical Interval Note:  04/07/2024 3:29 PM  Martha Schneider  has presented today for surgery, with the diagnosis of nstemi.  The various methods of treatment have been discussed with the patient and family. After consideration of risks, benefits and other options for treatment, the patient has consented to  Procedure(s): RIGHT/LEFT HEART CATH AND CORONARY ANGIOGRAPHY (N/A)  PERCUTANEOUS CORONARY INTERVENTION  as a surgical intervention.  The patient's history has been reviewed, patient examined, no change in status, stable for surgery.  I have reviewed the patient's chart and labs.  Questions were answered to the patient's satisfaction.    Cath Lab Visit (complete for each Cath Lab visit)  Clinical Evaluation Leading to the Procedure:   ACS: Yes.    Non-ACS:    Anginal Classification: CCS III  Anti-ischemic medical therapy: Minimal Therapy (1 class of medications)  Non-Invasive Test Results: Equivocal test results LVEF with apparent regional wall motion abnormality but trivial troponin elevation  Prior CABG: No previous CABG     Alm Clay

## 2024-04-07 NOTE — H&P (View-Only) (Signed)
 Cardiology Consultation   Patient ID: Martha Schneider MRN: [id number]; DOB: [birth year]  Admit date: 04/06/2024 Date of Consult: 04/07/2024  PCP:  Regino Slater, MD    HeartCare Providers Cardiologist:  None      Patient Profile: Martha Schneider is a 58 y.o. female with a hx of tobacco abuse, sarcoidosis with emphysema, hypertension, chronic leukopenia, and seizures who is being seen 04/07/2024 for the evaluation of shortness of breath at the request of Ivonne Mustache MD.  History of Present Illness: Martha Schneider is a 58 year old female who per chart review has not previously been seen by cardiology. Patient presented to the hospital for shortness of breath, wheezing, and increased cough for 1 week.  Apparently last week she started develop cough.  Over the past 2 to 3 days has been more short of breath.  She reports no chest pain or pressure and no chest tightness.  She has never had any heart issues.  She has COPD and sarcoidosis.  She is never had cardiac involvement.  On arrival to [facility] she is been hemodynamically stable was initially hypertensive.  EKG shows diffuse inferolateral T wave inversions.  CT PE study negative.  No evidence of congestive heart failure.  High-sensitivity troponins 50 up to 62 and down to 49.  She has chronic leukopenia.  She was started on a heparin  drip given abnormal EKG.  Echo obtained shows an EF of 40 to 45% with mid to apical hypokinesis of the inferior wall.  This is concerning for RCA stenosis.   Past Medical History:  Diagnosis Date   Depression    Hypertension    Sarcoidosis    Seizures (HCC)     No past surgical history on file.   Home Medications:  Prior to Admission medications   Medication Sig Start Date End Date Taking? Authorizing Provider  acetaminophen  (TYLENOL ) 500 MG tablet Take 500 mg by mouth every 6 (six) hours as needed.   Yes [provider]  amLODipine  (NORVASC ) 10 MG tablet Take 10 mg by mouth daily. 01/20/21   Yes [provider]  aspirin  325 MG tablet Take 325 mg by mouth daily.   Yes [provider]  cloBAZam  (ONFI ) 10 MG tablet Take 1 tablet (10 mg total) by mouth at bedtime. 03/10/24 09/06/24 Yes Camara, Amadou, MD  diazePAM , 15 MG Dose, (VALTOCO  15 MG DOSE) 2 x 7.5 MG/0.1ML LQPK Place 15 mg into the nose as needed (for seizure). Please provide a total of 5 box/10 doses 03/10/24  Yes Camara, Amadou, MD  lacosamide  (VIMPAT ) 200 MG TABS tablet Take 1 tablet (200 mg total) by mouth 2 (two) times daily. 03/10/24  Yes Gregg Lek, MD  levETIRAcetam  (KEPPRA ) 500 MG tablet Take 2 tablets (1,000 mg total) by mouth 2 (two) times daily. 03/10/24  Yes Gregg Lek, MD  sertraline  (ZOLOFT ) 100 MG tablet Take 1 tablet (100 mg total) by mouth daily. 06/04/22 08/18/24 Yes Camara, Amadou, MD  SUMAtriptan  (IMITREX ) 50 MG tablet Take 1 tablet (50 mg total) by mouth daily as needed for migraine (may repeat if migraine persists in 2 hours, not to exceed 2 tablets in 1 day). May repeat in 2 hours if headache persists or recurs. 03/10/24  Yes Gregg Lek, MD  UNKNOWN TO PATIENT Place 3 drops into both eyes See admin instructions. Unnamed eye drops (otc): Instill 3 drops into both eyes every morning- for irritation and/or allergies   Yes [provider]  verapamil  (VERELAN ) 120 MG 24 hr capsule TAKE  1 CAPSULE (120 MG TOTAL) BY MOUTH AT BEDTIME. Patient taking differently: Take 120 mg by mouth at bedtime as needed. 10/13/23  Yes Camara, Amadou, MD  nicotine  (NICODERM CQ  - DOSED IN MG/24 HOURS) 14 mg/24hr patch Place 1 patch (14 mg total) onto the skin daily. Patient not taking: Reported on 04/06/2024 04/17/22   Claudene Maximino LABOR, MD    Scheduled Meds:  amLODipine   10 mg Oral Daily   aspirin   325 mg Oral Daily   cloBAZam   10 mg Oral QHS   dexamethasone   6 mg Oral Daily   lacosamide   200 mg Oral BID   levETIRAcetam   1,000 mg Oral BID   nicotine   14 mg Transdermal Daily   sertraline   100 mg  Oral Daily   sodium chloride  flush  3 mL Intravenous Q12H   Continuous Infusions:  heparin  800 Units/hr (04/07/24 0654)   PRN Meds: acetaminophen  **OR** acetaminophen , albuterol , hydrALAZINE , hydrOXYzine , ondansetron  **OR** ondansetron  (ZOFRAN ) IV, senna-docusate  Allergies:   No Known Allergies  Social History:   Social History   Socioeconomic History   Marital status: Married    Spouse name: Not on file   Number of children: 2   Years of education: Not on file   Highest education level: Not on file  Occupational History   Not on file  Tobacco Use   Smoking status: Some Days    Current packs/day: 0.50    Average packs/day: 0.5 packs/day for 37.0 years (18.5 ttl pk-yrs)    Types: Cigarettes   Smokeless tobacco: Never  Vaping Use   Vaping status: Never Used  Substance and Sexual Activity   Alcohol use: Yes    Comment: occasionally   Drug use: No   Sexual activity: Not on file  Other Topics Concern   Not on file  Social History Narrative   Not on file   Social Drivers of Health   Financial Resource Strain: Not on file  Food Insecurity: Food Insecurity Present (04/06/2024)   Hunger Vital Sign    Worried About Running Out of Food in the Last Year: Often true    Ran Out of Food in the Last Year: Often true  Transportation Needs: No Transportation Needs (04/06/2024)   PRAPARE - Administrator, Civil Service (Medical): No    Lack of Transportation (Non-Medical): No  Physical Activity: Not on file  Stress: Not on file  Social Connections: Not on file  Intimate Partner Violence: Not At Risk (04/06/2024)   Humiliation, Afraid, Rape, and Kick questionnaire    Fear of Current or Ex-Partner: No    Emotionally Abused: No    Physically Abused: No    Sexually Abused: No    Family History:    Family History  Problem Relation Age of Onset   Heart attack Mother    Liver cancer Father    Diabetes Mellitus II Sister    Kidney disease Sister      ROS:  Please  see the history of present illness.   All other ROS reviewed and negative.     Physical Exam/Data: Vitals:   04/06/24 2234 04/06/24 2300 04/07/24 0658 04/07/24 1030  BP: (!) 128/112  129/85 118/76  Pulse: 78  75 74  Resp: 18  18 20   Temp: 98.2 F (36.8 C)  99 F (37.2 C) 99.1 F (37.3 C)  TempSrc: Oral   Oral  SpO2: 99%  100% 100%  Weight:  68.6 kg    Height:  5' 3 (  1.6 m)      Intake/Output Summary (Last 24 hours) at 04/07/2024 1111 Last data filed at 04/07/2024 0654 Gross per 24 hour  Intake 113.89 ml  Output --  Net 113.89 ml      04/06/2024   11:00 PM 03/10/2024   10:02 AM 08/19/2023   11:40 AM  Last 3 Weights  Weight (lbs) 151 lb 3.8 oz 152 lb 8 oz 153 lb 8 oz  Weight (kg) 68.6 kg 69.174 kg 69.627 kg     Body mass index is 26.79 kg/m.  General:  Well nourished, well developed, in no acute distress HEENT: normal Neck: no JVD Vascular: No carotid bruits; Distal pulses 2+ bilaterally Cardiac:  normal S1, S2; RRR; no murmur  Lungs: Diminished breath sounds bilaterally Abd: soft, nontender, no hepatomegaly  Ext: no edema Musculoskeletal:  No deformities, BUE and BLE strength normal and equal Skin: warm and dry  Neuro:  CNs 2-12 intact, no focal abnormalities noted Psych:  Normal affect   EKG:  The EKG was personally reviewed and demonstrates: Sinus rhythm 80, inferolateral T wave inversions Telemetry:  Telemetry was personally reviewed and demonstrates: Sinus rhythm 70 to 80 bpm, PVCs noted  Relevant CV Studies: LVEF 40 to 45%, mid to apical inferior hypokinesis on my review, moderate AI  Laboratory Data: High Sensitivity Troponin:  No results for input(s): TROPONINIHS in the last 720 hours.   Chemistry Recent Labs  Lab 04/06/24 1615 04/06/24 1706 04/06/24 1829 04/07/24 0339  NA 139 139  --  136  K 4.4 4.3  --  3.9  CL 99 99  --  99  CO2 28  --   --  25  GLUCOSE 108* 104*  --  190*  BUN 8 8  --  16  CREATININE 0.84 0.90  --  0.99  CALCIUM  9.7   --   --  9.1  MG  --   --  2.1  --   GFRNONAA >60  --   --  >60  ANIONGAP 13  --   --  12    Recent Labs  Lab 04/06/24 1615  PROT 8.9*  ALBUMIN 4.7  AST 39  ALT 17  ALKPHOS 101  BILITOT 0.6   Lipids No results for input(s): CHOL, TRIG, HDL, LABVLDL, LDLCALC, CHOLHDL in the last 168 hours.  Hematology Recent Labs  Lab 04/06/24 1615 04/06/24 1706 04/07/24 0339  WBC 2.9*  --  3.1*  RBC 5.28*  --  4.93  HGB 15.5* 15.3* 14.4  HCT 47.4* 45.0 43.4  MCV 89.8  --  88.0  MCH 29.4  --  29.2  MCHC 32.7  --  33.2  RDW 14.3  --  14.2  PLT 233  --  249   Thyroid  No results for input(s): TSH, FREET4 in the last 168 hours.  BNPNo results for input(s): BNP, PROBNP in the last 168 hours.  DDimer No results for input(s): DDIMER in the last 168 hours.  Radiology/Studies:  CT Soft Tissue Neck W Contrast Result Date: 04/06/2024 EXAM: CT NECK WITH CONTRAST 04/06/2024 06:59:09 PM TECHNIQUE: CT of the neck was performed with the administration of 75 mL of iohexol  (OMNIPAQUE ) 350 MG/ML injection. Multiplanar reformatted images are provided for review. Automated exposure control, iterative reconstruction, and/or weight based adjustment of the mA/kV was utilized to reduce the radiation dose to as low as reasonably achievable. COMPARISON: Same day neck soft tissue radiograph. CLINICAL HISTORY: Epiglottitis or tonsillitis suspected. FINDINGS: AERODIGESTIVE TRACT: The nasopharynx is symmetric.  Retropharyngeal course of the cervical internal carotid arteries which indents the posterior wall of the oropharynx. The oropharynx is otherwise unremarkable. Symmetric appearance of the tonsils. No evidence of peritonsillar abscess. Normal appearance of the oral cavity and floor of mouth. Normal appearance of the base of tongue and epiglottis. No retropharyngeal fluid collection. The supraglottic airway is relatively symmetric. Symmetric appearance of the vocal folds. SALIVARY GLANDS: The parotid  and submandibular glands are unremarkable. THYROID : Unremarkable. LYMPH NODES: No suspicious cervical lymphadenopathy. SOFT TISSUES: No mass or fluid collection. BRAIN, ORBITS, SINUSES AND MASTOIDS: Visualized portions of the paranasal sinuses and mastoid air cells are clear. No acute abnormality. LUNGS AND MEDIASTINUM: Paraseptal emphysema at the right lung apex. No acute abnormality. BONES: Degenerative changes in the visualized spine. No focal bone abnormality. IMPRESSION: 1. No CT evidence of epiglottitis or tonsillitis. 2. No cervical lymphadenopathy. Electronically signed by: Donnice Mania MD 04/06/2024 07:23 PM EST RP Workstation: [id number]   CT Angio Chest PE W/Cm &/Or Wo Cm Result Date: 04/06/2024 CLINICAL DATA:  Shortness of breath. Concern for pulmonary embolism. EXAM: CT ANGIOGRAPHY CHEST WITH CONTRAST TECHNIQUE: Multidetector CT imaging of the chest was performed using the standard protocol during bolus administration of intravenous contrast. Multiplanar CT image reconstructions and MIPs were obtained to evaluate the vascular anatomy. RADIATION DOSE REDUCTION: This exam was performed according to the departmental dose-optimization program which includes automated exposure control, adjustment of the mA and/or kV according to patient size and/or use of iterative reconstruction technique. CONTRAST:  75mL OMNIPAQUE  IOHEXOL  350 MG/ML SOLN COMPARISON:  CT dated 11/10/2019. Chest radiograph dated 04/06/2024. FINDINGS: Cardiovascular: There is no cardiomegaly or pericardial effusion. The thoracic aorta is unremarkable. Mild dilatation of the main pulmonary trunk suggestive of pulmonary hypertension. Clinical correlation recommended. No pulmonary artery embolus identified. Mediastinum/Nodes: No hilar or mediastinal adenopathy. The esophagus is grossly unremarkable. No mediastinal fluid collection. Lungs/Pleura: Background of emphysema. Bibasilar subpleural atelectasis/scarring. There is an 8 mm left lower  lobe nodule along the fissure present on the prior CT of 2021. Right upper lobe reticular scarring. No consolidative changes. There is no pleural effusion pneumothorax. The central airways are patent. Upper Abdomen: No acute abnormality. Musculoskeletal: No acute osseous pathology. Review of the MIP images confirms the above findings. IMPRESSION: 1. No acute intrathoracic pathology. No CT evidence of pulmonary artery embolus. 2.  Emphysema (ICD10-J43.9). Electronically Signed   By: Vanetta Chou M.D.   On: 04/06/2024 19:16   DG Neck Soft Tissue Result Date: 04/06/2024 EXAM: 1 VIEW(S) XRAY OF THE SOFT TISSUE NECK 04/06/2024 05:17:00 PM COMPARISON: None available. CLINICAL HISTORY: SOB (shortness of breath). FINDINGS: SOFT TISSUES: Prevertebral soft tissue are unremarkable. EPIGLOTTIS: No epiglottic thickening. BONES: Diffuse degenerative disc disease throughout the cervical spine. IMPRESSION: 1. No acute findings. Electronically signed by: Franky Crease MD 04/06/2024 05:51 PM EST RP Workstation: [id number]   DG Chest Port 1 View Result Date: 04/06/2024 EXAM: 1 VIEW(S) XRAY OF THE CHEST 04/06/2024 05:17:00 PM COMPARISON: 11/25/2022. CLINICAL HISTORY: sob sob FINDINGS: LUNGS AND PLEURA: No focal pulmonary opacity. No pleural effusion. No pneumothorax. HEART AND MEDIASTINUM: No acute abnormality of the cardiac and mediastinal silhouettes. BONES AND SOFT TISSUES: No acute osseous abnormality. IMPRESSION: 1. No acute cardiopulmonary process. Electronically signed by: Franky Crease MD 04/06/2024 05:50 PM EST RP Workstation: [id number]     Assessment and Plan:  # Non-STEMI # Inferior/inferolateral WMA - Admitted with cough. EKG with inferolateral TWI. Echo on my review EF 40-45% with inferior/posterior HK. No PE on CT study.  -  No CP - Admitted with cough that is worsened. I question if she had her event 1 week ago.  - On heparin . Continue ASA 81 mg daily. Start lipitor 80 mg daily.  - check A1c, TSH, and  lipids tomorrow.  - discussed need for LHC. She is NPO and will proceed today.   Informed Consent   Shared Decision Making/Informed Consent The risks [stroke (1 in 1000), death (1 in 1000), kidney failure [usually temporary] (1 in 500), bleeding (1 in 200), allergic reaction [possibly serious] (1 in 200)], benefits (diagnostic support and management of coronary artery disease) and alternatives of a cardiac catheterization were discussed in detail with Ms. Wahlstrom and she is willing to proceed.      # COPD # Enlarged pulmonary artery - PA severely dilated on my review. No O2 at home. We will do right heart as well.   # Sarcoidosis - no conduction disease. No evidence of cardiac involvement.    For questions or updates, please contact [facility] Please consult [email or web address] for contact info under   Signed, Darryle T. Barbaraann, MD, [facility]  [facility]  [address] [place], [place] [address] [phone]  12:10 PM

## 2024-04-07 NOTE — Progress Notes (Signed)
 SATURATION QUALIFICATIONS: (This note is used to comply with regulatory documentation for home oxygen)  Patient Saturations on Room Air at Rest = 92%  Patient Saturations on Room Air while Ambulating = 80%  Patient Saturations on 2 Liters of oxygen while Ambulating = 91%  Please briefly explain why patient needs home oxygen: will need o2 to maintain saturations with ambulation

## 2024-04-07 NOTE — Progress Notes (Signed)
 PROGRESS NOTE  Martha Schneider  FMW:[id number] DOB: 03-17-1966 DOA: 04/06/2024 PCP: Regino Slater, MD   Brief Narrative: Patient is a 58 year old female with history of sarcoidosis, emphysema, seizure disorder, chronic leukopenia, hypertension, depression, chronic hoarseness due to vocal cord polyp who presented with complaints of shortness of breath.  She was having anxiety attacks as well.  She reported increased wheezing, cough.  On presentation, she was mildly hypertensive, hypoxic on room air and had to be put on nonrebreather.  Lab work showed WBC count of 2.9, elevated troponin in the range of 50s.  COVID/flu/RSV negative.  CTA chest negative for PE or any other acute intrathoracic pathology, showed emphysematous changes.  CT soft tissue neck did not show any evidence of epiglottitis or tonsillitis.  Patient was given IV Decadron  for hoarseness/wheezing resulting in improvement in the respiratory status.  She was weaned to 2 L of oxygen per minute.  Due elevated troponin and abnormal  EKG, cardiology consulted.  Started on IV heparin .  Cardiology consulted.  Echo done today showed EF of 55%, regional wall motion abnormality, moderate left ventricular hypertrophy, severe aortic valve regurgitation.  Cardiology planning for right/left heart cath today.    Assessment & Plan:  Principal Problem:   Elevated troponin Active Problems:   Chronic leukopenia   Hypertension   Depression   Seizure disorder (HCC)   Sarcoidosis  Elevated troponin/EKG changes/regional wall motion abnormality on the echo: Presented with dyspnea.Started on IV heparin . Echo done today showed EF of 50-55%, regional wall motion abnormality, moderate left ventricular hypertrophy, severe aortic valve regurgitation.  Cardiology planning for right/left heart cath today.  Looks euvolemic. Taking A1c, TSH, lipid panel  Acute hypoxic respiratory failure/sarcoidosis/history of emphysema: Presented with shortness of breath, wheezing.   Not wheezing this morning. She qualified for home oxygen for 2 L/min.  Chronic hoarseness due to vocal cord polyp: Given a dose of dexamethasone  .  Further dosing stopped  due to possible acute coronary syndrome.  Continue bronchodilators.  We recommend to follow-up with ENT as an outpatient  History of seizure disorder: On Keppra , Vimpat , clobazam   Hypertension: On amlodipine .  Blood pressure stable  History of chronic leukopenia: She was seen by hematology in the past. currently counts are stable.  Recommend to follow-up with hematology as an outpatient  Depression: On Sertraline          DVT prophylaxis:IV heparin      Code Status: Full Code  Family Communication: None at the bedside  Patient status: Obs  Patient is from : Home  Anticipated discharge to: Home  Estimated DC date: After full workup   Consultants: Cardiology  Procedures: None  Antimicrobials:  Anti-infectives (From admission, onward)    None       Subjective: Patient seen and examined at bedside today.  Hemodynamically stable.  Was on 2 L of oxygen per minute.  Denies any worsening shortness of breath or cough.  Has hoarseness which she feels is at baseline.  No chest pain this morning.  Objective: Vitals:   04/06/24 2234 04/06/24 2300 04/07/24 0658 04/07/24 1030  BP: (!) 128/112  129/85 118/76  Pulse: 78  75 74  Resp: 18  18 20   Temp: 98.2 F (36.8 C)  99 F (37.2 C) 99.1 F (37.3 C)  TempSrc: Oral   Oral  SpO2: 99%  100% 100%  Weight:  68.6 kg    Height:  5' 3 (1.6 m)      Intake/Output Summary (Last 24 hours) at 04/07/2024 1247 Last  data filed at 04/07/2024 9345 Gross per 24 hour  Intake 113.89 ml  Output --  Net 113.89 ml   Filed Weights   04/06/24 2300  Weight: 68.6 kg    Examination:  General exam: Overall comfortable, not in distress HEENT: PERRL, hoarseness Respiratory system:  no wheezes or crackles but hoarseness/stridor Cardiovascular system: S1 & S2 heard,  RRR.  Gastrointestinal system: Abdomen is nondistended, soft and nontender. Central nervous system: Alert and oriented Extremities: No edema, no clubbing ,no cyanosis Skin: No rashes, no ulcers,no icterus     Data Reviewed: I have personally reviewed following labs and imaging studies  CBC: Recent Labs  Lab 04/06/24 1615 04/06/24 1706 04/07/24 0339  WBC 2.9*  --  3.1*  NEUTROABS  --   --  2.2  HGB 15.5* 15.3* 14.4  HCT 47.4* 45.0 43.4  MCV 89.8  --  88.0  PLT 233  --  249   Basic Metabolic Panel: Recent Labs  Lab 04/06/24 1615 04/06/24 1706 04/06/24 1829 04/07/24 0339  NA 139 139  --  136  K 4.4 4.3  --  3.9  CL 99 99  --  99  CO2 28  --   --  25  GLUCOSE 108* 104*  --  190*  BUN 8 8  --  16  CREATININE 0.84 0.90  --  0.99  CALCIUM  9.7  --   --  9.1  MG  --   --  2.1  --      Recent Results (from the past 240 hours)  Resp panel by RT-PCR (RSV, Flu A&B, Covid) Anterior Nasal Swab     Status: None   Collection Time: 04/06/24  4:32 PM   Specimen: Anterior Nasal Swab  Result Value Ref Range Status   SARS Coronavirus 2 by RT PCR NEGATIVE NEGATIVE Final    Comment: (NOTE) SARS-CoV-2 target nucleic acids are NOT DETECTED.  The SARS-CoV-2 RNA is generally detectable in upper respiratory specimens during the acute phase of infection. The lowest concentration of SARS-CoV-2 viral copies this assay can detect is 138 copies/mL. A negative result does not preclude SARS-Cov-2 infection and should not be used as the sole basis for treatment or other patient management decisions. A negative result may occur with  improper specimen collection/handling, submission of specimen other than nasopharyngeal swab, presence of viral mutation(s) within the areas targeted by this assay, and inadequate number of viral copies(<138 copies/mL). A negative result must be combined with clinical observations, patient history, and epidemiological information. The expected result is  Negative.  Fact Sheet for Patients:  bloggercourse.com  Fact Sheet for Healthcare Providers:  seriousbroker.it  This test is no t yet approved or cleared by the [place]  FDA and  has been authorized for detection and/or diagnosis of SARS-CoV-2 by FDA under an Emergency Use Authorization (EUA). This EUA will remain  in effect (meaning this test can be used) for the duration of the COVID-19 declaration under Section 564(b)(1) of the Act, 21 U.S.C.section 360bbb-3(b)(1), unless the authorization is terminated  or revoked sooner.       Influenza A by PCR NEGATIVE NEGATIVE Final   Influenza B by PCR NEGATIVE NEGATIVE Final    Comment: (NOTE) The Xpert Xpress SARS-CoV-2/FLU/RSV plus assay is intended as an aid in the diagnosis of influenza from Nasopharyngeal swab specimens and should not be used as a sole basis for treatment. Nasal washings and aspirates are unacceptable for Xpert Xpress SARS-CoV-2/FLU/RSV testing.  Fact Sheet for Patients: bloggercourse.com  Fact Sheet for Healthcare Providers: seriousbroker.it  This test is not yet approved or cleared by the [place]  FDA and has been authorized for detection and/or diagnosis of SARS-CoV-2 by FDA under an Emergency Use Authorization (EUA). This EUA will remain in effect (meaning this test can be used) for the duration of the COVID-19 declaration under Section 564(b)(1) of the Act, 21 U.S.C. section 360bbb-3(b)(1), unless the authorization is terminated or revoked.     Resp Syncytial Virus by PCR NEGATIVE NEGATIVE Final    Comment: (NOTE) Fact Sheet for Patients: bloggercourse.com  Fact Sheet for Healthcare Providers: seriousbroker.it  This test is not yet approved or cleared by the [place]  FDA and has been authorized for detection and/or diagnosis of  SARS-CoV-2 by FDA under an Emergency Use Authorization (EUA). This EUA will remain in effect (meaning this test can be used) for the duration of the COVID-19 declaration under Section 564(b)(1) of the Act, 21 U.S.C. section 360bbb-3(b)(1), unless the authorization is terminated or revoked.  Performed at [facility], [address] [place], [place] [address]   Group A Strep by PCR     Status: None   Collection Time: 04/06/24  4:54 PM   Specimen: Throat; Sterile Swab  Result Value Ref Range Status   Group A Strep by PCR NOT DETECTED NOT DETECTED Final    Comment: Performed at [facility], [address] [place], [place] [address]     Radiology Studies: ECHOCARDIOGRAM COMPLETE Result Date: 04/07/2024    ECHOCARDIOGRAM REPORT   Patient Name:   DAYANE HILLENBURG Date of Exam: 04/07/2024 Medical Rec #:  [id number]    Height:       63.0 in Accession #:    [id number]   Weight:       151.2 lb Date of Birth:  Apr 16, 1966     BSA:          1.717 m Patient Age:    58 years     BP:           129/85 mmHg Patient Gender: F            HR:           76 bpm. Exam Location:  Inpatient Procedure: 2D Echo, Cardiac Doppler, Color Doppler, Strain Analysis and            Intracardiac Opacification Agent (Both Spectral and Color Flow            Doppler were utilized during procedure). Indications:    R06.02 SOB. Elevated troponin  History:        Patient has no prior history of Echocardiogram examinations.                 Risk Factors:Hypertension. Sarcoidosis.  Sonographer:    Ellouise Mose RDCS Referring Phys: [id number] VISHAL R PATEL  Sonographer Comments: Technically difficult study due to poor echo windows. Patient has severe hoarseness, corrupted SSN doppler. IMPRESSIONS  1. The aortic valve is tricuspid. Aortic valve regurgitation is severe. No aortic stenosis is present.  2. Left ventricular ejection fraction, by estimation, is 50 to 55%. The left ventricle has low normal function.  The left ventricle demonstrates regional wall motion abnormalities (see scoring diagram/findings for description). There is moderate left ventricular hypertrophy. Indeterminate diastolic filling due to E-A fusion. The average left ventricular global longitudinal strain is -14.9 %. The global longitudinal strain is abnormal.  3. Right ventricular systolic function is mildly reduced. The right ventricular size  is normal.  4. Moderate pleural effusion.  5. The mitral valve is normal in structure. No evidence of mitral valve regurgitation. No evidence of mitral stenosis.  6. The inferior vena cava is normal in size with greater than 50% respiratory variability, suggesting right atrial pressure of 3 mmHg. FINDINGS  Left Ventricle: Left ventricular ejection fraction, by estimation, is 50 to 55%. The left ventricle has low normal function. The left ventricle demonstrates regional wall motion abnormalities. Definity contrast agent was given IV to delineate the left ventricular endocardial borders. The average left ventricular global longitudinal strain is -14.9 %. Strain was performed and the global longitudinal strain is abnormal. The left ventricular internal cavity size was normal in size. There is moderate left  ventricular hypertrophy. Indeterminate diastolic filling due to E-A fusion. Right Ventricle: The right ventricular size is normal. No increase in right ventricular wall thickness. Right ventricular systolic function is mildly reduced. Left Atrium: Left atrial size was normal in size. Right Atrium: Right atrial size was normal in size. Pericardium: There is no evidence of pericardial effusion. Mitral Valve: The mitral valve is normal in structure. No evidence of mitral valve regurgitation. No evidence of mitral valve stenosis. Tricuspid Valve: The tricuspid valve is normal in structure. Tricuspid valve regurgitation is mild . No evidence of tricuspid stenosis. Aortic Valve: The aortic valve is tricuspid. Aortic  valve regurgitation is severe. Aortic regurgitation PHT measures 579 msec. No aortic stenosis is present. Pulmonic Valve: The pulmonic valve was normal in structure. Pulmonic valve regurgitation is not visualized. No evidence of pulmonic stenosis. Aorta: The aortic root and ascending aorta are structurally normal, with no evidence of dilitation. Venous: The right upper pulmonary vein is normal. The inferior vena cava is normal in size with greater than 50% respiratory variability, suggesting right atrial pressure of 3 mmHg. IAS/Shunts: There is left bowing of the interatrial septum, suggestive of elevated right atrial pressure. No atrial level shunt detected by color flow Doppler. Additional Comments: There is a moderate pleural effusion.  LEFT VENTRICLE PLAX 2D LVIDd:         3.90 cm     Diastology LVIDs:         2.70 cm     LV e' medial:    3.70 cm/s LV PW:         2.00 cm     LV E/e' medial:  11.1 LV IVS:        1.50 cm     LV e' lateral:   4.68 cm/s LVOT diam:     2.30 cm     LV E/e' lateral: 8.8 LV SV:         95 LV SV Index:   55          2D Longitudinal Strain LVOT Area:     4.15 cm    2D Strain GLS Avg:     -14.9 %  LV Volumes (MOD) LV vol d, MOD A2C: 76.3 ml LV vol d, MOD A4C: 89.3 ml LV vol s, MOD A2C: 38.7 ml LV vol s, MOD A4C: 40.8 ml LV SV MOD A2C:     37.6 ml LV SV MOD A4C:     89.3 ml LV SV MOD BP:      45.4 ml RIGHT VENTRICLE            IVC RV S prime:     8.81 cm/s  IVC diam: 1.70 cm TAPSE (M-mode): 1.3 cm  PULMONARY VEINS                            Diastolic Velocity: 40.70 cm/s                            S/D Velocity:       1.90                            Systolic Velocity:  77.60 cm/s LEFT ATRIUM             Index        RIGHT ATRIUM           Index LA diam:        2.70 cm 1.57 cm/m   RA Area:     10.90 cm LA Vol (A2C):   18.5 ml 10.77 ml/m  RA Volume:   18.40 ml  10.72 ml/m LA Vol (A4C):   23.5 ml 13.69 ml/m LA Biplane Vol: 22.1 ml 12.87 ml/m  AORTIC VALVE LVOT  Vmax:   115.00 cm/s LVOT Vmean:  77.100 cm/s LVOT VTI:    0.228 m AI PHT:      579 msec  AORTA Ao Root diam: 2.90 cm Ao Asc diam:  3.70 cm MITRAL VALVE               TRICUSPID VALVE MV Area (PHT): 3.59 cm    TR Peak grad:   24.4 mmHg MV Decel Time: 212 msec    TR Vmax:        247.00 cm/s MV E velocity: 41.00 cm/s MV A velocity: 68.85 cm/s  SHUNTS MV E/A ratio:  0.60        Systemic VTI:  0.23 m                            Systemic Diam: 2.30 cm Oneil Parchment MD Electronically signed by Oneil Parchment MD Signature Date/Time: 04/07/2024/12:40:16 PM    Final    CT Soft Tissue Neck W Contrast Result Date: 04/06/2024 EXAM: CT NECK WITH CONTRAST 04/06/2024 06:59:09 PM TECHNIQUE: CT of the neck was performed with the administration of 75 mL of iohexol  (OMNIPAQUE ) 350 MG/ML injection. Multiplanar reformatted images are provided for review. Automated exposure control, iterative reconstruction, and/or weight based adjustment of the mA/kV was utilized to reduce the radiation dose to as low as reasonably achievable. COMPARISON: Same day neck soft tissue radiograph. CLINICAL HISTORY: Epiglottitis or tonsillitis suspected. FINDINGS: AERODIGESTIVE TRACT: The nasopharynx is symmetric. Retropharyngeal course of the cervical internal carotid arteries which indents the posterior wall of the oropharynx. The oropharynx is otherwise unremarkable. Symmetric appearance of the tonsils. No evidence of peritonsillar abscess. Normal appearance of the oral cavity and floor of mouth. Normal appearance of the base of tongue and epiglottis. No retropharyngeal fluid collection. The supraglottic airway is relatively symmetric. Symmetric appearance of the vocal folds. SALIVARY GLANDS: The parotid and submandibular glands are unremarkable. THYROID : Unremarkable. LYMPH NODES: No suspicious cervical lymphadenopathy. SOFT TISSUES: No mass or fluid collection. BRAIN, ORBITS, SINUSES AND MASTOIDS: Visualized portions of the paranasal sinuses and mastoid air  cells are clear. No acute abnormality. LUNGS AND MEDIASTINUM: Paraseptal emphysema at the right lung apex. No acute abnormality. BONES: Degenerative changes in the visualized spine. No focal bone abnormality. IMPRESSION: 1. No CT evidence of epiglottitis  or tonsillitis. 2. No cervical lymphadenopathy. Electronically signed by: Donnice Mania MD 04/06/2024 07:23 PM EST RP Workstation: [id number]   CT Angio Chest PE W/Cm &/Or Wo Cm Result Date: 04/06/2024 CLINICAL DATA:  Shortness of breath. Concern for pulmonary embolism. EXAM: CT ANGIOGRAPHY CHEST WITH CONTRAST TECHNIQUE: Multidetector CT imaging of the chest was performed using the standard protocol during bolus administration of intravenous contrast. Multiplanar CT image reconstructions and MIPs were obtained to evaluate the vascular anatomy. RADIATION DOSE REDUCTION: This exam was performed according to the departmental dose-optimization program which includes automated exposure control, adjustment of the mA and/or kV according to patient size and/or use of iterative reconstruction technique. CONTRAST:  75mL OMNIPAQUE  IOHEXOL  350 MG/ML SOLN COMPARISON:  CT dated 11/10/2019. Chest radiograph dated 04/06/2024. FINDINGS: Cardiovascular: There is no cardiomegaly or pericardial effusion. The thoracic aorta is unremarkable. Mild dilatation of the main pulmonary trunk suggestive of pulmonary hypertension. Clinical correlation recommended. No pulmonary artery embolus identified. Mediastinum/Nodes: No hilar or mediastinal adenopathy. The esophagus is grossly unremarkable. No mediastinal fluid collection. Lungs/Pleura: Background of emphysema. Bibasilar subpleural atelectasis/scarring. There is an 8 mm left lower lobe nodule along the fissure present on the prior CT of 2021. Right upper lobe reticular scarring. No consolidative changes. There is no pleural effusion pneumothorax. The central airways are patent. Upper Abdomen: No acute abnormality. Musculoskeletal: No acute  osseous pathology. Review of the MIP images confirms the above findings. IMPRESSION: 1. No acute intrathoracic pathology. No CT evidence of pulmonary artery embolus. 2.  Emphysema (ICD10-J43.9). Electronically Signed   By: Vanetta Chou M.D.   On: 04/06/2024 19:16   DG Neck Soft Tissue Result Date: 04/06/2024 EXAM: 1 VIEW(S) XRAY OF THE SOFT TISSUE NECK 04/06/2024 05:17:00 PM COMPARISON: None available. CLINICAL HISTORY: SOB (shortness of breath). FINDINGS: SOFT TISSUES: Prevertebral soft tissue are unremarkable. EPIGLOTTIS: No epiglottic thickening. BONES: Diffuse degenerative disc disease throughout the cervical spine. IMPRESSION: 1. No acute findings. Electronically signed by: Franky Crease MD 04/06/2024 05:51 PM EST RP Workstation: [id number]   DG Chest Port 1 View Result Date: 04/06/2024 EXAM: 1 VIEW(S) XRAY OF THE CHEST 04/06/2024 05:17:00 PM COMPARISON: 11/25/2022. CLINICAL HISTORY: sob sob FINDINGS: LUNGS AND PLEURA: No focal pulmonary opacity. No pleural effusion. No pneumothorax. HEART AND MEDIASTINUM: No acute abnormality of the cardiac and mediastinal silhouettes. BONES AND SOFT TISSUES: No acute osseous abnormality. IMPRESSION: 1. No acute cardiopulmonary process. Electronically signed by: Kevin Dover MD 04/06/2024 05:50 PM EST RP Workstation: [id number]    Scheduled Meds:  amLODipine   10 mg Oral Daily   aspirin  EC  81 mg Oral Daily   atorvastatin   80 mg Oral Daily   budesonide  (PULMICORT ) nebulizer solution  0.5 mg Nebulization BID   cloBAZam   10 mg Oral QHS   free water  500 mL Oral Once   ipratropium-albuterol   3 mL Nebulization Q6H   lacosamide   200 mg Oral BID   levETIRAcetam   1,000 mg Oral BID   nicotine   14 mg Transdermal Daily   sertraline   100 mg Oral Daily   sodium chloride  flush  3 mL Intravenous Q12H   Continuous Infusions:  heparin  800 Units/hr (04/07/24 0654)     LOS: 0 days   Ivonne Mustache, MD [facility] P12/01/2024, 12:47 PM

## 2024-04-07 NOTE — Progress Notes (Signed)
°  Echocardiogram 2D Echocardiogram has been performed.  Martha Schneider 04/07/2024, 9:14 AM

## 2024-04-07 NOTE — Progress Notes (Signed)
 PHARMACY - ANTICOAGULATION CONSULT NOTE  Pharmacy Consult for Heparin  Indication: chest pain/ACS  No Known Allergies  Patient Measurements: Height: 5' 3 (160 cm) Weight: 68.6 kg (151 lb 3.8 oz) IBW/kg (Calculated) : 52.4 HEPARIN  DW (KG): 66.4 Weight = 69.2 kg (03/10/24)  Vital Signs: Temp: 99.1 F (37.3 C) (12/10 1030) Temp Source: Oral (12/10 1030) BP: 118/76 (12/10 1030) Pulse Rate: 74 (12/10 1030)  Labs: Recent Labs    04/06/24 1615 04/06/24 1706 04/07/24 0339 04/07/24 1014  HGB 15.5* 15.3* 14.4  --   HCT 47.4* 45.0 43.4  --   PLT 233  --  249  --   HEPARINUNFRC  --   --  0.38 0.27*  CREATININE 0.84 0.90 0.99  --     Estimated Creatinine Clearance: 56.9 mL/min (by C-G formula based on SCr of 0.99 mg/dL).   Medical History: Past Medical History:  Diagnosis Date   Depression    Hypertension    Sarcoidosis    Seizures (HCC)     Medications:  No oral anticoagulation PTA  Assessment: 58 yr female with shortness of breath and difficulty swallowing. Concerned for possible airway obstruction. Elevated troponin, EKG with T wave changes.  Cardiology consulted and recommended starting IV heparin  and will see patient tomorrow CTAngio = no PE  04/07/2024 Heparin  level 0.27 therapeutic on 800 units/hr CBC WNL No bleeding reported   Goal of Therapy:  Heparin  level 0.3-0.7 units/ml Monitor platelets by anticoagulation protocol: Yes   Plan:  Increase  Heparin  drip to 900 units/hr Confirmatory heparin  level in 6 hours Daily CBC and heparin  level   Dolphus Roller, PharmD, BCPS 04/07/2024 11:44 AM

## 2024-04-08 ENCOUNTER — Encounter (HOSPITAL_COMMUNITY): Payer: Self-pay | Admitting: Cardiology

## 2024-04-09 ENCOUNTER — Observation Stay (HOSPITAL_COMMUNITY)
Admission: EM | Admit: 2024-04-09 | Discharge: 2024-04-10 | Disposition: A | Attending: Emergency Medicine | Admitting: Emergency Medicine

## 2024-04-09 ENCOUNTER — Emergency Department (HOSPITAL_COMMUNITY)

## 2024-04-09 ENCOUNTER — Other Ambulatory Visit: Payer: Self-pay

## 2024-04-09 DIAGNOSIS — R0602 Shortness of breath: Secondary | ICD-10-CM | POA: Diagnosis present

## 2024-04-09 DIAGNOSIS — R7989 Other specified abnormal findings of blood chemistry: Secondary | ICD-10-CM | POA: Diagnosis present

## 2024-04-09 DIAGNOSIS — R061 Stridor: Secondary | ICD-10-CM | POA: Diagnosis not present

## 2024-04-09 DIAGNOSIS — J9601 Acute respiratory failure with hypoxia: Secondary | ICD-10-CM | POA: Diagnosis present

## 2024-04-09 DIAGNOSIS — Z79899 Other long term (current) drug therapy: Secondary | ICD-10-CM | POA: Diagnosis not present

## 2024-04-09 DIAGNOSIS — D869 Sarcoidosis, unspecified: Secondary | ICD-10-CM | POA: Diagnosis not present

## 2024-04-09 DIAGNOSIS — G40909 Epilepsy, unspecified, not intractable, without status epilepticus: Secondary | ICD-10-CM | POA: Diagnosis not present

## 2024-04-09 DIAGNOSIS — I10 Essential (primary) hypertension: Secondary | ICD-10-CM | POA: Diagnosis present

## 2024-04-09 DIAGNOSIS — J383 Other diseases of vocal cords: Secondary | ICD-10-CM

## 2024-04-09 DIAGNOSIS — F32A Depression, unspecified: Secondary | ICD-10-CM | POA: Diagnosis present

## 2024-04-09 DIAGNOSIS — Z7982 Long term (current) use of aspirin: Secondary | ICD-10-CM | POA: Diagnosis not present

## 2024-04-09 DIAGNOSIS — Z720 Tobacco use: Secondary | ICD-10-CM | POA: Diagnosis present

## 2024-04-09 DIAGNOSIS — D709 Neutropenia, unspecified: Secondary | ICD-10-CM | POA: Diagnosis not present

## 2024-04-09 DIAGNOSIS — F17210 Nicotine dependence, cigarettes, uncomplicated: Secondary | ICD-10-CM | POA: Diagnosis not present

## 2024-04-09 DIAGNOSIS — E871 Hypo-osmolality and hyponatremia: Secondary | ICD-10-CM | POA: Diagnosis not present

## 2024-04-09 DIAGNOSIS — J441 Chronic obstructive pulmonary disease with (acute) exacerbation: Secondary | ICD-10-CM | POA: Diagnosis not present

## 2024-04-09 DIAGNOSIS — F17200 Nicotine dependence, unspecified, uncomplicated: Secondary | ICD-10-CM | POA: Diagnosis present

## 2024-04-09 DIAGNOSIS — Z20822 Contact with and (suspected) exposure to covid-19: Secondary | ICD-10-CM | POA: Diagnosis not present

## 2024-04-09 LAB — MAGNESIUM: Magnesium: 1.6 mg/dL — ABNORMAL LOW (ref 1.7–2.4)

## 2024-04-09 LAB — I-STAT VENOUS BLOOD GAS, ED
Acid-Base Excess: 0 mmol/L (ref 0.0–2.0)
Bicarbonate: 25.2 mmol/L (ref 20.0–28.0)
Calcium, Ion: 0.93 mmol/L — ABNORMAL LOW (ref 1.15–1.40)
HCT: 43 % (ref 36.0–46.0)
Hemoglobin: 14.6 g/dL (ref 12.0–15.0)
O2 Saturation: 83 %
Potassium: 3.6 mmol/L (ref 3.5–5.1)
Sodium: 130 mmol/L — ABNORMAL LOW (ref 135–145)
TCO2: 26 mmol/L (ref 22–32)
pCO2, Ven: 43 mmHg — ABNORMAL LOW (ref 44–60)
pH, Ven: 7.376 (ref 7.25–7.43)
pO2, Ven: 49 mmHg — ABNORMAL HIGH (ref 32–45)

## 2024-04-09 LAB — CBC WITH DIFFERENTIAL/PLATELET
Abs Immature Granulocytes: 0.01 K/uL (ref 0.00–0.07)
Basophils Absolute: 0 K/uL (ref 0.0–0.1)
Basophils Relative: 0 %
Eosinophils Absolute: 0 K/uL (ref 0.0–0.5)
Eosinophils Relative: 1 %
HCT: 42.8 % (ref 36.0–46.0)
Hemoglobin: 14.1 g/dL (ref 12.0–15.0)
Immature Granulocytes: 0 %
Lymphocytes Relative: 25 %
Lymphs Abs: 0.8 K/uL (ref 0.7–4.0)
MCH: 29.4 pg (ref 26.0–34.0)
MCHC: 32.9 g/dL (ref 30.0–36.0)
MCV: 89.2 fL (ref 80.0–100.0)
Monocytes Absolute: 0.4 K/uL (ref 0.1–1.0)
Monocytes Relative: 11 %
Neutro Abs: 2 K/uL (ref 1.7–7.7)
Neutrophils Relative %: 63 %
Platelets: 207 K/uL (ref 150–400)
RBC: 4.8 MIL/uL (ref 3.87–5.11)
RDW: 13.8 % (ref 11.5–15.5)
WBC: 3.2 K/uL — ABNORMAL LOW (ref 4.0–10.5)
nRBC: 0 % (ref 0.0–0.2)

## 2024-04-09 LAB — BASIC METABOLIC PANEL WITH GFR
Anion gap: 15 (ref 5–15)
BUN: <5 mg/dL — ABNORMAL LOW (ref 6–20)
CO2: 20 mmol/L — ABNORMAL LOW (ref 22–32)
Calcium: 8.3 mg/dL — ABNORMAL LOW (ref 8.9–10.3)
Chloride: 95 mmol/L — ABNORMAL LOW (ref 98–111)
Creatinine, Ser: 0.55 mg/dL (ref 0.44–1.00)
GFR, Estimated: >60 mL/min (ref >60)
Glucose, Bld: 106 mg/dL — ABNORMAL HIGH (ref 70–99)
Potassium: 3.7 mmol/L (ref 3.5–5.1)
Sodium: 130 mmol/L — ABNORMAL LOW (ref 135–145)

## 2024-04-09 LAB — SODIUM: Sodium: 132 mmol/L — ABNORMAL LOW (ref 135–145)

## 2024-04-09 LAB — TROPONIN I (HIGH SENSITIVITY)
Troponin I (High Sensitivity): 64 ng/L — ABNORMAL HIGH (ref <18)
Troponin I (High Sensitivity): 50 ng/L — ABNORMAL HIGH (ref <18)

## 2024-04-09 LAB — RESP PANEL BY RT-PCR (RSV, FLU A&B, COVID)  RVPGX2
Influenza A by PCR: NEGATIVE
Influenza B by PCR: NEGATIVE
Resp Syncytial Virus by PCR: NEGATIVE
SARS Coronavirus 2 by RT PCR: NEGATIVE

## 2024-04-09 LAB — OSMOLALITY, URINE: Osmolality, Ur: 194 mosm/kg — ABNORMAL LOW (ref 300–900)

## 2024-04-09 LAB — PHOSPHORUS: Phosphorus: 3.5 mg/dL (ref 2.5–4.6)

## 2024-04-09 LAB — OSMOLALITY: Osmolality: 286 mosm/kg (ref 275–295)

## 2024-04-09 LAB — SODIUM, URINE, RANDOM: Sodium, Ur: <30 mmol/L

## 2024-04-09 MED ORDER — SODIUM CHLORIDE 0.9% FLUSH
3.0000 mL | Freq: Two times a day (BID) | INTRAVENOUS | Status: DC
  Administered 2024-04-09 – 2024-04-10 (×3): 3 mL via INTRAVENOUS

## 2024-04-09 MED ORDER — IPRATROPIUM-ALBUTEROL 0.5-2.5 (3) MG/3ML IN SOLN
3.0000 mL | Freq: Four times a day (QID) | RESPIRATORY_TRACT | Status: DC
  Administered 2024-04-09 – 2024-04-10 (×3): 3 mL via RESPIRATORY_TRACT
  Filled 2024-04-09 (×4): qty 3

## 2024-04-09 MED ORDER — FLEET ENEMA RE ENEM: 1.0000 | ENEMA | Freq: Once | RECTAL | Status: DC | PRN

## 2024-04-09 MED ORDER — VERAPAMIL HCL ER 120 MG PO CP24: 120.0000 mg | ORAL_CAPSULE | Freq: Every day | ORAL | Status: DC

## 2024-04-09 MED ORDER — LEVETIRACETAM 500 MG PO TABS
1000.0000 mg | ORAL_TABLET | Freq: Once | ORAL | Status: AC
  Administered 2024-04-09: 1000 mg via ORAL
  Filled 2024-04-09: qty 2

## 2024-04-09 MED ORDER — HYDROMORPHONE HCL 1 MG/ML IJ SOLN: 0.5000 mg | INTRAMUSCULAR | Status: DC | PRN

## 2024-04-09 MED ORDER — LIDOCAINE VISCOUS HCL 2 % MT SOLN
15.0000 mL | Freq: Once | OROMUCOSAL | Status: DC
  Filled 2024-04-09: qty 15

## 2024-04-09 MED ORDER — HYDRALAZINE HCL 20 MG/ML IJ SOLN: 10.0000 mg | INTRAMUSCULAR | Status: DC | PRN

## 2024-04-09 MED ORDER — RACEPINEPHRINE HCL 2.25 % IN NEBU: 0.5000 mL | INHALATION_SOLUTION | Freq: Once | RESPIRATORY_TRACT | Status: DC

## 2024-04-09 MED ORDER — CALCIUM GLUCONATE-NACL 2-0.675 GM/100ML-% IV SOLN
2.0000 g | Freq: Once | INTRAVENOUS | Status: AC
  Administered 2024-04-09: 2000 mg via INTRAVENOUS
  Filled 2024-04-09: qty 100

## 2024-04-09 MED ORDER — TRAZODONE HCL 50 MG PO TABS: 25.0000 mg | ORAL_TABLET | Freq: Every evening | ORAL | Status: DC | PRN

## 2024-04-09 MED ORDER — OXYCODONE HCL 5 MG PO TABS: 5.0000 mg | ORAL_TABLET | ORAL | Status: DC | PRN

## 2024-04-09 MED ORDER — SUMATRIPTAN SUCCINATE 50 MG PO TABS: 50.0000 mg | ORAL_TABLET | Freq: Every day | ORAL | Status: DC | PRN

## 2024-04-09 MED ORDER — METHYLPREDNISOLONE SODIUM SUCC 40 MG IJ SOLR
40.0000 mg | Freq: Two times a day (BID) | INTRAMUSCULAR | Status: DC
  Administered 2024-04-09: 40 mg via INTRAVENOUS
  Filled 2024-04-09: qty 1

## 2024-04-09 MED ORDER — LACOSAMIDE 50 MG PO TABS
200.0000 mg | ORAL_TABLET | Freq: Once | ORAL | Status: AC
  Administered 2024-04-09: 200 mg via ORAL
  Filled 2024-04-09: qty 4

## 2024-04-09 MED ORDER — HEPARIN SODIUM (PORCINE) 5000 UNIT/ML IJ SOLN
5000.0000 [IU] | Freq: Three times a day (TID) | INTRAMUSCULAR | Status: DC
  Administered 2024-04-09 – 2024-04-10 (×4): 5000 [IU] via SUBCUTANEOUS
  Filled 2024-04-09 (×4): qty 1

## 2024-04-09 MED ORDER — ONDANSETRON HCL 4 MG PO TABS: 4.0000 mg | ORAL_TABLET | Freq: Four times a day (QID) | ORAL | Status: DC | PRN

## 2024-04-09 MED ORDER — ASPIRIN 325 MG PO TABS
325.0000 mg | ORAL_TABLET | Freq: Every day | ORAL | Status: DC
  Administered 2024-04-09 – 2024-04-10 (×2): 325 mg via ORAL
  Filled 2024-04-09 (×2): qty 1

## 2024-04-09 MED ORDER — ONDANSETRON HCL 4 MG/2ML IJ SOLN: 4.0000 mg | Freq: Four times a day (QID) | INTRAMUSCULAR | Status: DC | PRN

## 2024-04-09 MED ORDER — IPRATROPIUM BROMIDE 0.02 % IN SOLN
0.5000 mg | Freq: Four times a day (QID) | RESPIRATORY_TRACT | Status: DC | PRN
  Administered 2024-04-09: 0.5 mg via RESPIRATORY_TRACT
  Filled 2024-04-09: qty 2.5

## 2024-04-09 MED ORDER — MAGNESIUM SULFATE 2 GM/50ML IV SOLN: 2.0000 g | Freq: Once | INTRAVENOUS | Status: DC

## 2024-04-09 MED ORDER — ACETAMINOPHEN 650 MG RE SUPP: 650.0000 mg | Freq: Four times a day (QID) | RECTAL | Status: DC | PRN

## 2024-04-09 MED ORDER — BISACODYL 5 MG PO TBEC: 5.0000 mg | DELAYED_RELEASE_TABLET | Freq: Every day | ORAL | Status: DC | PRN

## 2024-04-09 MED ORDER — ACETAMINOPHEN 325 MG PO TABS: 650.0000 mg | ORAL_TABLET | Freq: Four times a day (QID) | ORAL | Status: DC | PRN

## 2024-04-09 MED ORDER — NICOTINE 14 MG/24HR TD PT24
14.0000 mg | MEDICATED_PATCH | Freq: Every day | TRANSDERMAL | Status: DC
  Administered 2024-04-09 – 2024-04-10 (×2): 14 mg via TRANSDERMAL
  Filled 2024-04-09 (×2): qty 1

## 2024-04-09 MED ORDER — METHYLPREDNISOLONE SODIUM SUCC 125 MG IJ SOLR
80.0000 mg | Freq: Two times a day (BID) | INTRAMUSCULAR | Status: DC
  Administered 2024-04-09 – 2024-04-10 (×2): 80 mg via INTRAVENOUS
  Filled 2024-04-09 (×2): qty 2

## 2024-04-09 MED ORDER — RACEPINEPHRINE HCL 2.25 % IN NEBU
INHALATION_SOLUTION | RESPIRATORY_TRACT | Status: AC
  Administered 2024-04-09: 15 mL
  Filled 2024-04-09: qty 15

## 2024-04-09 MED ORDER — SENNOSIDES-DOCUSATE SODIUM 8.6-50 MG PO TABS: 1.0000 | ORAL_TABLET | Freq: Every evening | ORAL | Status: DC | PRN

## 2024-04-09 MED ADMIN — Budesonide Inhalation Susp 0.25 MG/2ML: 0.25 mg | RESPIRATORY_TRACT | NDC 00487960101

## 2024-04-09 MED ADMIN — Sertraline HCl Tab 100 MG: 100 mg | ORAL | NDC 16714061304

## 2024-04-09 MED ADMIN — Guaifenesin Liquid 100 MG/5ML: 10 mL | ORAL | NDC 81033010210

## 2024-04-09 MED FILL — Guaifenesin Liquid 100 MG/5ML: 10.0000 mL | ORAL | Qty: 10 | Status: AC

## 2024-04-09 MED FILL — Amlodipine Besylate Tab 10 MG (Base Equivalent): 10.0000 mg | ORAL | Qty: 1 | Status: AC

## 2024-04-09 MED FILL — Sertraline HCl Tab 100 MG: 100.0000 mg | ORAL | Qty: 1 | Status: AC

## 2024-04-09 MED FILL — Budesonide Inhalation Susp 0.25 MG/2ML: 0.2500 mg | RESPIRATORY_TRACT | Qty: 2 | Status: AC

## 2024-04-09 MED FILL — Amlodipine Besylate Tab 5 MG (Base Equivalent): 10.0000 mg | ORAL | Qty: 2 | Status: AC

## 2024-04-09 NOTE — Assessment & Plan Note (Signed)
 Serum calcium  8.3, ionized calcium  0.93 -Monitoring replating accordingly

## 2024-04-09 NOTE — Assessment & Plan Note (Signed)
 Denies any chest pain, - status post recent evaluation by cardiology, status post cardiac cath, echocardiogram with EF 50-55% -workup within normal limits with exception of severe aortic regurgitation Per cardiology follow-up as an outpatient-no aggressive treatment

## 2024-04-09 NOTE — Progress Notes (Signed)
 Pt arrived from ED cia stretcher.  Pt on 3L [place] (acute) on arrival, congested cough noted.  Pt placed on tele and verified by second RN.  VS obtained.  Pt brought dinner tray with her from ED.  Call bell within reach and bed alarm on for safety

## 2024-04-09 NOTE — Evaluation (Signed)
 Physical Therapy Evaluation Patient Details Name: Martha Schneider MRN: [id number] DOB: 06-03-1965 Today's Date: 04/09/2024  History of Present Illness  Pt is a 58 year old woman who presented on 04/09/24 with acute hypoxic respiratory failure in the setting of sarcoidosis and history of emphysema. Pt with recent ED visit in which she left AMA. PMH: tobacco use, seizures disorder, HTN, chronic vocal cord polyp hoarseness, chronic leukopenia, depression.  Clinical Impression  Pt is currently presenting at Mod I for bed mobility, CGA for sit to stand and gait. Pt demonstrates good strength to perform stairs per home set as evidenced by sit to stand and gait. Spouse is able to assist as needed. Pt is limited by fatigue/respiratory status. Pt will benefit from rollator at home in order to help with decreased endurance and activity tolerance. Currently pt is presenting at baseline level of functioning and no skilled physical therapy services recommended. Pt will be discharged from skilled physical therapy services at this time; please re-consult if further needs arise.            If plan is discharge home, recommend the following: Help with stairs or ramp for entrance;A little help with walking and/or transfers     Equipment Recommendations Rollator (4 wheels)     Functional Status Assessment Patient has not had a recent decline in their functional status     Precautions / Restrictions Precautions Precautions: Fall Recall of Precautions/Restrictions: Intact Restrictions Weight Bearing Restrictions Per Provider Order: No      Mobility  Bed Mobility Overal bed mobility: Modified Independent    Transfers Overall transfer level: Needs assistance Equipment used: Rollator (4 wheels) Transfers: Sit to/from Stand Sit to Stand: Contact guard assist           General transfer comment: CGA for safety; pt performing all safety requirements inculding managing breaks. Pt reports occasional giving  out of the R knee    Ambulation/Gait Ambulation/Gait assistance: Contact guard assist Gait Distance (Feet): 200 Feet Assistive device: Rollator (4 wheels) Gait Pattern/deviations: Step-through pattern, Decreased stride length Gait velocity: decreased Gait velocity interpretation: 1.31 - 2.62 ft/sec, indicative of limited community ambulator   General Gait Details: CGA for safety due to pt reports occasional R kneegiving out O2 sats remained 91% and above on 3L O2 via [place], 1x seated rest break for ~3 min due to shortness of breathe, improved with second attempt at gait.      Balance Overall balance assessment: History of Falls, No apparent balance deficits (not formally assessed)         Pertinent Vitals/Pain Pain Assessment Pain Assessment: No/denies pain    Home Living Family/patient expects to be discharged to:: Private residence Living Arrangements: Spouse/significant other;Children (and son) Available Help at Discharge: Family;Available PRN/intermittently Type of Home: House Home Access: Stairs to enter Entrance Stairs-Rails: None Entrance Stairs-Number of Steps: 2 Alternate Level Stairs-Number of Steps: 12-13 Home Layout: Two level Home Equipment: None Additional Comments: Spous recently brought fridge and microwave upstairs    Prior Function Prior Level of Function : Needs assist             Mobility Comments: ambulating without an AD. ADLs Comments: Spouse helps with pants, shoes and PRN with cleaning.     Extremity/Trunk Assessment   Upper Extremity Assessment Upper Extremity Assessment: Right hand dominant;Overall [facility] for tasks assessed;Defer to OT evaluation    Lower Extremity Assessment Lower Extremity Assessment: Overall WFL for tasks assessed    Cervical / Trunk Assessment Cervical / Trunk  Assessment: Normal  Communication   Communication Communication: No apparent difficulties    Cognition Arousal: Alert Behavior During Therapy: WFL  for tasks assessed/performed   PT - Cognitive impairments: No apparent impairments       Following commands: Intact       Cueing Cueing Techniques: Verbal cues     General Comments General comments (skin integrity, edema, etc.): Supine 132/77, sitting 123/87, standing 115/80, after 3 min 109/75        Assessment/Plan    PT Assessment Patient does not need any further PT services         PT Goals (Current goals can be found in the Care Plan section)  Acute Rehab PT Goals Patient Stated Goal: to return home and use a rollator for safety PT Goal Formulation: All assessment and education complete, DC therapy Potential to Achieve Goals: Good     AM-PAC PT 6 Clicks Mobility  Outcome Measure Help needed turning from your back to your side while in a flat bed without using bedrails?: None Help needed moving from lying on your back to sitting on the side of a flat bed without using bedrails?: None Help needed moving to and from a bed to a chair (including a wheelchair)?: A Little Help needed standing up from a chair using your arms (e.g., wheelchair or bedside chair)?: A Little Help needed to walk in hospital room?: A Little Help needed climbing 3-5 steps with a railing? : A Little 6 Click Score: 20    End of Session Equipment Utilized During Treatment: Gait belt;Oxygen Activity Tolerance: Patient tolerated treatment well Patient left: in bed;with call bell/phone within reach Nurse Communication: Mobility status      Time: 1216-1252 PT Time Calculation (min) (ACUTE ONLY): 36 min   Charges:   PT Evaluation $PT Eval Low Complexity: 1 Low   PT General Charges $$ ACUTE PT VISIT: 1 Visit         Dorothyann Maier, DPT, CLT  Acute Rehabilitation Services Office: [phone] (Secure chat preferred)   Dorothyann VEAR Maier 04/09/2024, 4:06 PM

## 2024-04-09 NOTE — ED Notes (Signed)
 Pt assisted to and from bathroom. Urine sent to lab.

## 2024-04-09 NOTE — ED Notes (Signed)
 CCMD called and notified

## 2024-04-09 NOTE — ED Provider Notes (Signed)
 [place] EMERGENCY DEPARTMENT AT [facility] Provider Note   CSN: [id number] Arrival date & time: 04/09/24  0725     Patient presents with: Shortness of Breath   Martha Schneider is a 58 y.o. female.   Pt is a 58y/o female with hx of sarcoidosis with emphysema and chronic vocal cord polyps and hoarseness, seizure disorder, chronic leukopenia, HTN who is presenting today in respiratory distress with EMS.  Patient reports for the last week she has had increased cough with thick white mucus and since Tuesday has had worsening shortness of breath.  When EMS arrived today patient was in distress with a sat in the 70s with wheezing and stridor.  She was given 2 DuoNebs and 125 mg of Solu-Medrol  prior to arrival with improvement in symptoms.  Patient is still complaining of feeling short of breath but reports is better.  She has been having somewhat of a sore throat but denies any fever, chest pain, nausea, vomiting or abdominal pain.  She has not had any leg swelling.  Patient left AMA from the hospital 2 days ago while she was being worked up for similar symptoms as the above.  She did had a cardiac catheterization that showed no obstructive lesions and normal coronary arteries however echo showed an EF of 50 to 55% with severe aortic regurgitation also some increased pulmonary pressure.  Patient states because she left AMA she was not able to pick up prescriptions  The history is provided by the patient and medical records.  Shortness of Breath      Prior to Admission medications  Medication Sig Start Date End Date Taking? Authorizing Provider  acetaminophen  (TYLENOL ) 500 MG tablet Take 500 mg by mouth every 6 (six) hours as needed.    [provider]  amLODipine  (NORVASC ) 10 MG tablet Take 10 mg by mouth daily. 01/20/21   [provider]  aspirin  325 MG tablet Take 325 mg by mouth daily.    [provider]  cloBAZam  (ONFI ) 10 MG tablet Take 1 tablet (10 mg  total) by mouth at bedtime. 03/10/24 09/06/24  Gregg Lek, MD  diazePAM , 15 MG Dose, (VALTOCO  15 MG DOSE) 2 x 7.5 MG/0.1ML LQPK Place 15 mg into the nose as needed (for seizure). Please provide a total of 5 box/10 doses 03/10/24   Gregg Lek, MD  lacosamide  (VIMPAT ) 200 MG TABS tablet Take 1 tablet (200 mg total) by mouth 2 (two) times daily. 03/10/24   Camara, Amadou, MD  levETIRAcetam  (KEPPRA ) 500 MG tablet Take 2 tablets (1,000 mg total) by mouth 2 (two) times daily. 03/10/24   Camara, Amadou, MD  nicotine  (NICODERM CQ  - DOSED IN MG/24 HOURS) 14 mg/24hr patch Place 1 patch (14 mg total) onto the skin daily. Patient not taking: Reported on 04/06/2024 04/17/22   Claudene Maximino LABOR, MD  sertraline  (ZOLOFT ) 100 MG tablet Take 1 tablet (100 mg total) by mouth daily. 06/04/22 08/18/24  Camara, Amadou, MD  SUMAtriptan  (IMITREX ) 50 MG tablet Take 1 tablet (50 mg total) by mouth daily as needed for migraine (may repeat if migraine persists in 2 hours, not to exceed 2 tablets in 1 day). May repeat in 2 hours if headache persists or recurs. 03/10/24   Camara, Amadou, MD  UNKNOWN TO PATIENT Place 3 drops into both eyes See admin instructions. Unnamed eye drops (otc): Instill 3 drops into both eyes every morning- for irritation and/or allergies    [provider]  verapamil  (VERELAN ) 120 MG 24  hr capsule TAKE 1 CAPSULE (120 MG TOTAL) BY MOUTH AT BEDTIME. Patient taking differently: Take 120 mg by mouth at bedtime as needed. 10/13/23   Camara, Amadou, MD    Allergies: Patient has no known allergies.    Review of Systems  Respiratory:  Positive for shortness of breath.     Updated Vital Signs BP (!) 156/90   Pulse 86   Temp 97.9 F (36.6 C) (Oral)   Ht 5' 3 (1.6 m)   Wt 68 kg   SpO2 100%   BMI 26.56 kg/m   Physical Exam Vitals and nursing note reviewed.  Constitutional:      General: She is not in acute distress.    Appearance: She is well-developed. She is ill-appearing.  HENT:      Head: Normocephalic and atraumatic.     Mouth/Throat:     Mouth: Mucous membranes are moist.     Pharynx: Oropharynx is clear.     Comments: No pharyngeal, tongue or uvular swelling Eyes:     Conjunctiva/sclera: Conjunctivae normal.     Pupils: Pupils are equal, round, and reactive to light.  Neck:     Comments: Hoarse voice with resting inspiratory stridor Cardiovascular:     Rate and Rhythm: Normal rate and regular rhythm.     Pulses: Normal pulses.     Heart sounds: No murmur heard. Pulmonary:     Effort: Tachypnea and accessory muscle usage present. No respiratory distress.     Breath sounds: Stridor present. Wheezing present. No rales.  Abdominal:     General: There is no distension.     Palpations: Abdomen is soft.     Tenderness: There is no abdominal tenderness. There is no guarding or rebound.  Musculoskeletal:        General: No tenderness. Normal range of motion.     Cervical back: Normal range of motion and neck supple.     Right lower leg: No edema.     Left lower leg: No edema.  Skin:    General: Skin is warm and dry.     Findings: No erythema or rash.  Neurological:     Mental Status: She is alert and oriented to person, place, and time. Mental status is at baseline.  Psychiatric:        Behavior: Behavior normal.     Comments: Calm and cooperative     (all labs ordered are listed, but only abnormal results are displayed) Labs Reviewed  CBC WITH DIFFERENTIAL/PLATELET - Abnormal; Notable for the following components:      Result Value   WBC 3.2 (*)    All other components within normal limits  BASIC METABOLIC PANEL WITH GFR - Abnormal; Notable for the following components:   Sodium 130 (*)    Chloride 95 (*)    CO2 20 (*)    Glucose, Bld 106 (*)    BUN <5 (*)    Calcium  8.3 (*)    All other components within normal limits  I-STAT VENOUS BLOOD GAS, ED - Abnormal; Notable for the following components:   pCO2, Ven 43.0 (*)    pO2, Ven 49 (*)    Sodium  130 (*)    Calcium , Ion 0.93 (*)    All other components within normal limits  TROPONIN I (HIGH SENSITIVITY) - Abnormal; Notable for the following components:   Troponin I (High Sensitivity) 64 (*)    All other components within normal limits  RESP PANEL BY RT-PCR (RSV, FLU  A&B, COVID)  RVPGX2  TROPONIN I (HIGH SENSITIVITY)    EKG: EKG Interpretation Date/Time:  Friday April 09 2024 09:35:39 EST Ventricular Rate:  84 PR Interval:    QRS Duration:  164 QT Interval:  401 QTC Calculation: 474 R Axis:   -66  Text Interpretation: Sinus rhythm Ventricular premature complex Nonspecific IVCD with LAD Left ventricular hypertrophy Anterior Q waves, possibly due to LVH Artifact in lead(s) I III aVR aVL aVF Confirmed by Doretha Folks ([id number]) on 04/09/2024 10:00:03 AM  Radiology: [facility] Chest Port 1 View Result Date: 04/09/2024 EXAM: 1 VIEW(S) XRAY OF THE CHEST 04/09/2024 07:48:00 AM COMPARISON: Portable chest 04/06/2024. CLINICAL HISTORY: SOB. FINDINGS: LINES, TUBES AND DEVICES: Telemetry leads overlie the chest as before. LUNGS AND PLEURA: There is low inspiration on the exam with increased basilar bronchovascular crowding. No focal pneumonia is evident, with limited view of the bases. No pleural effusion. No pneumothorax. HEART AND MEDIASTINUM: Mild cardiomegaly. No vascular congestion is seen. The mediastinum is stable with aortic tortuosity and atherosclerosis. BONES AND SOFT TISSUES: No acute osseous abnormality. IMPRESSION: 1. No acute cardiopulmonary process identified. Low inspiratory effort. 2. Mild cardiomegaly without signs of congestion. Electronically signed by: Francis Quam MD 04/09/2024 07:56 AM EST RP Workstation: [id number]   CARDIAC CATHETERIZATION Result Date: 04/07/2024 Table formatting from the original result was not included. Images from the original result were not included. Dominance: Right    Angiographically normal coronary arteries with borderline Pulmonary Arterial  Pressures. (Mean PAP 23 mmHg and LVEDP/PCWP 18 to 20 mmHg) Mild to moderately reduced Cardiac Output-Index by Fick (4.33-2.52)  RECOMMENDATIONS   Anticipated discharge date to be determined.   After discussion with Dr. Darryle Decent, plan will be to pursue cardiac MRI to evaluate for myocarditis   No indication for antiplatelet therapy at this time . Alm MICAEL Clay, MD, MS Alm Clay, M.D., M.S. Interventional Cardiologist [facility] Pager # [phone]    Procedures   Medications Ordered in the ED  lidocaine  (XYLOCAINE ) 2 % viscous mouth solution 15 mL (has no administration in time range)  Racepinephrine HCl 2.25 % nebulizer solution (15 mLs  Given 04/09/24 0736)  lacosamide  (VIMPAT ) tablet 200 mg (200 mg Oral Given 04/09/24 0955)  levETIRAcetam  (KEPPRA ) tablet 1,000 mg (1,000 mg Oral Given 04/09/24 0954)                                    Medical Decision Making Amount and/or Complexity of Data Reviewed Independent Historian: EMS External Data Reviewed: notes. Labs: ordered. Decision-making details documented in ED Course. Radiology: ordered and independent interpretation performed. Decision-making details documented in ED Course. ECG/medicine tests: ordered and independent interpretation performed. Decision-making details documented in ED Course.  Risk OTC drugs. Prescription drug management. Decision regarding hospitalization.   Pt with multiple medical problems and comorbidities and presenting today with a complaint that caries a high risk for morbidity and mortality.  Here today with the above complaints.  Patient is having ongoing shortness of breath in the setting of also complaining of productive cough and some URI symptoms over the last week.  Patient was hospitalized for similar symptoms over the weekend and had elevated troponins.  She underwent catheterization that showed normal coronary arteries and an echo that showed severe aortic regurgitation and EF  mildly reduced at 50 to 55%.  She did not have any further workup at that time.  Patient is an ongoing tobacco  user and does have an inhaler at home.  She was hypoxic today when EMS arrived in the 70s.  Sats are now 100% on 2 L of oxygen but patient continues to have stridor and wheezing.  Based on prior notes it seems that patient has vocal cord polyps and chronic coarseness and with some stridor at baseline.  She has no oral swelling noted on exam.  She denies any fevers or chest pain.  She does not have overt signs of fluid overload at this time.  No evidence of dysrhythmia patient's heart rate is under 100.  Blood pressure is elevated today.  Patient within the last week has had a CT of her soft tissue neck that showed no acute findings as well as a CTA that was negative for PE.  Will ensure no evidence of pneumonia at this time.  Concern for COPD exacerbation, possible flare in her sarcoidosis, myocarditis or complications from valve dysfunction.  Patient has already received 125 mg of Solu-Medrol .  She was given racemic epi here and will reevaluate. 10:37 AM After to make epi patient still has mild stridor and still feel short of breath but improved.  I independently interpreted patient's labs and EKG.  EKG does have significant artifact but it appears to be in a sinus rhythm.  VBG without acute findings, CBC with persistent leukopenia, normal hemoglobin and platelet count, troponin continues to be elevated at 62 but not significantly different, BMP with mild hyponatremia normal creatinine. I have independently visualized and interpreted pt's images today.  Chest x-ray without acute findings.   On reevaluation patient is still requiring oxygen and sats are 93%.  She still has significant stridor and noisy breathing but does not have significant expiratory wheezing at this time.  Based on her last hospitalization patient qualified for home O2 but because she left AMA she did not get that she also has no  inhalers at home.  She initially was getting steroids but that was stopped while she had the cardiac evaluation done.  The echo did show severe aortic regurgitation but there is no following cardiology note as to how that needs management.  Do not feel that she is having CHF at this time causing her symptoms.  She is requiring oxygen and will most likely need some more racemic epi and DuoNebs.  Feel that she would benefit from admission overnight to continue therapies and ensure that she has all the things she needs at home prior to discharge.  She is comfortable with this plan.,  Hospitalist was consulted for further care.    Final diagnoses:  COPD exacerbation (HCC)  Inspiratory stridor  Acute respiratory failure with hypoxia [facility])    ED Discharge Orders     None          Doretha Folks, MD 04/09/24 1037

## 2024-04-09 NOTE — Assessment & Plan Note (Signed)
 Will need outpatient follow-up with PCP, likely pulmonologist -Continue IV steroids

## 2024-04-09 NOTE — Evaluation (Signed)
 Occupational Therapy Evaluation and Discharge Patient Details Name: Martha Schneider MRN: [id number] DOB: 01/02/1966 Today's Date: 04/09/2024   History of Present Illness   Pt is a 58 year old woman who presented on 04/09/24 with acute hypoxic respiratory failure in the setting of sarcoidosis and history of emphysema. Pt with recent ED visit in which she left AMA. PMH: tobacco use, seizures disorder, HTN, chronic vocal cord polyp hoarseness, chronic leukopenia, depression.     Clinical Impressions Pt typically walks short distances independently, primarily stays on the second floor of her home due to decreased activity tolerance. Her husband assist with IADLs and LB dressing. Pt presents with decreased activity tolerance and impaired standing balance. She benefits from a rollator for stability (hx of R knee buckling), safety with intermittent dizziness and energy conservation. Pt with SpO2 of 91% with ambulation on 3L, RR 35. Instructed in pacing. Pt is familiar with pursed lip breathing and energy conservation strategies. No further OT needs.     If plan is discharge home, recommend the following:   A little help with walking and/or transfers;A little help with bathing/dressing/bathroom;Assistance with cooking/housework;Assist for transportation;Help with stairs or ramp for entrance     Functional Status Assessment   Patient has had a recent decline in their functional status and demonstrates the ability to make significant improvements in function in a reasonable and predictable amount of time.     Equipment Recommendations   Other (comment) (rollator)     Recommendations for Other Services         Precautions/Restrictions   Precautions Precautions: Fall Recall of Precautions/Restrictions: Intact Restrictions Weight Bearing Restrictions Per Provider Order: No     Mobility Bed Mobility Overal bed mobility: Modified Independent                   Transfers Overall transfer level: Needs assistance Equipment used: Rollator (4 wheels) Transfers: Sit to/from Stand Sit to Stand: Contact guard assist           General transfer comment: CGA for safety; pt performing all safety requirements inculding managing breaks. Pt reports occasional giving out of the R knee      Balance Overall balance assessment: History of Falls, No apparent balance deficits (not formally assessed)                                         ADL either performed or assessed with clinical judgement   ADL Overall ADL's : Modified independent                                       General ADL Comments: Pt is aware of pursed lip breathing and energy conservation techniques.     Vision Baseline Vision/History: 1 Wears glasses Ability to See in Adequate Light: 0 Adequate Patient Visual Report: No change from baseline       Perception         Praxis         Pertinent Vitals/Pain Pain Assessment Pain Assessment: No/denies pain     Extremity/Trunk Assessment Upper Extremity Assessment Upper Extremity Assessment: Overall WFL for tasks assessed;Right hand dominant   Lower Extremity Assessment Lower Extremity Assessment: Defer to PT evaluation   Cervical / Trunk Assessment Cervical / Trunk Assessment: Normal   Communication Communication Communication: Impaired Factors  Affecting Communication: Other (comment) (low volume)   Cognition Arousal: Alert Behavior During Therapy: WFL for tasks assessed/performed Cognition: No apparent impairments                               Following commands: Intact       Cueing  General Comments   Cueing Techniques: Verbal cues  Supine 132/77, sitting 123/87, standing 115/80, after 3 min 109/75   Exercises     Shoulder Instructions      Home Living Family/patient expects to be discharged to:: Private residence Living Arrangements:  Spouse/significant other;Children Available Help at Discharge: Family;Available PRN/intermittently Type of Home: House Home Access: Stairs to enter [employer] of Steps: 2 Entrance Stairs-Rails: None Home Layout: Two level Alternate Level Stairs-Number of Steps: 12-13 Alternate Level Stairs-Rails: Left Bathroom Shower/Tub: Chief Strategy Officer: Standard Bathroom Accessibility: Yes   Home Equipment: None   Additional Comments: stays on second floor of house primarily with microwave and fridge upstairs      Prior Functioning/Environment Prior Level of Function : Needs assist             Mobility Comments: ambulating without an AD. ADLs Comments: Spouse helps with pants, shoes and PRN with cleaning.    OT Problem List: Decreased activity tolerance;Impaired balance (sitting and/or standing)   OT Treatment/Interventions:        OT Goals(Current goals can be found in the care plan section)       OT Frequency:       Co-evaluation              AM-PAC OT 6 Clicks Daily Activity     Outcome Measure Help from another person eating meals?: None Help from another person taking care of personal grooming?: A Little Help from another person toileting, which includes using toliet, bedpan, or urinal?: A Little Help from another person bathing (including washing, rinsing, drying)?: A Little Help from another person to put on and taking off regular upper body clothing?: None Help from another person to put on and taking off regular lower body clothing?: A Little 6 Click Score: 20   End of Session Equipment Utilized During Treatment: Gait belt;Rollator (4 wheels)  Activity Tolerance: Patient tolerated treatment well Patient left: in bed;with call bell/phone within reach  OT Visit Diagnosis: Unsteadiness on feet (R26.81);Other (comment) (decreased activity tolerance)                Time: 8783-8749 OT Time Calculation (min): 34 min Charges:  OT  General Charges $OT Visit: 1 Visit OT Evaluation $OT Eval Low Complexity: 1 Low  Mliss HERO, OTR/L Acute Rehabilitation Services Office: [phone]   Kennth Mliss Helling 04/09/2024, 4:30 PM

## 2024-04-09 NOTE — Assessment & Plan Note (Deleted)
 Shortness numbness of breath with hypoxia-satting 70% on room air -was on 5 now on  2 L of oxygen, satting 100% -Multifactorial including emphysema, undiagnosed COPD, chronic tobacco use, sarcoidosis -vocal cord polyps -Will continue with DuoNeb bronchodilator treatments scheduled and as needed -She has received 125 mg of IV Solu-Medrol , will continue with 80 mg twice daily -Will encourage use of incentive spirometer, flutter valve

## 2024-04-09 NOTE — Assessment & Plan Note (Signed)
 Will continue sertraline 

## 2024-04-09 NOTE — ED Notes (Addendum)
 Pt blood collected and cup of water taken to patient.

## 2024-04-09 NOTE — Assessment & Plan Note (Addendum)
 Hyponatremia, serum sodium level fluctuating between 135-130, 130 -Multifactorial, poor oral intake, nutrition deficiency, sarcoidosis and seizure medication -Will monitor closely (SIADH workup)

## 2024-04-09 NOTE — Assessment & Plan Note (Signed)
 Shortness numbness of breath with hypoxia-satting 70% on room air -was on 5 now on  2 L of oxygen, satting 100%  -Multifactorial including emphysema, undiagnosed COPD, chronic tobacco use, sarcoidosis -vocal cord polyps  -Will continue with DuoNeb bronchodilator treatments scheduled and as needed - Will initiate Pulmicort  nebs twice daily -She has received 125 mg of IV Solu-Medrol , will continue with 80 mg twice daily -Will encourage use of incentive spirometer, flutter valve

## 2024-04-09 NOTE — Assessment & Plan Note (Signed)
-   Elevated BP noted, patient noncompliant with medications -Home meds will be reviewed: Currently listed Norvasc ,

## 2024-04-09 NOTE — Hospital Course (Signed)
 Alleyne Lac is a 58 year old female with history of sarcoidosis, emphysema, smoker, seizure disorder hypertension, chronic vocal cord polyp-hoarseness, chronic leukopenia.  Presenting once again to the ED with chief complaint of shortness of breath. Per EDP EMS was called today found her satting in 70s on room air, wheezing with stridors. Patient received 2 DuoNeb breathing treatment and IV Solu-Medrol  125 mg prior to arrival.  Patient now satting greater 94% on 2 L of oxygen.  Note: Patient had a recent ED visit where she left AMA On prior visit patient had an extensive cardiac workup including echo and cardiac cath revealing normal studies with ejection fraction of 50-55%, with exception of severe aortic regurgitation Patient left AMA prior to admission, medication, inhalers, supplemental oxygen, and follow-up appointment for cardiology and ENT  ED Evaluation: Blood pressure (!) 156/90, pulse 86, temperature 97.9 F (36.6 C), temperature source Oral, height 5' 3 (1.6 m), weight 68 kg, SpO2 100% on 2 L of oxygen  Labs: CBC WBC 3.2, eosinophil normal at 1, neutrophils normal at 63, BMP sodium 130, chloride 95, CO2 20, glucose 106, ionized calcium  0.3, troponin 64 Respiratory panel influenza A, B RSV, COVID all negative Chest x-rayMild cardiomegaly without signs of congestion,

## 2024-04-09 NOTE — Assessment & Plan Note (Signed)
 Chronic neutropenic of unknown etiology -No signs of infection

## 2024-04-09 NOTE — Assessment & Plan Note (Signed)
 Patient was advised on consult extensive regarding smoking cessation -Current patch has been provided

## 2024-04-09 NOTE — H&P (Addendum)
 History and Physical   Patient: Martha Schneider                            PCP: Regino Slater, MD                    DOB: 1966-01-13            DOA: 04/09/2024 FMW:[id number]             DOS: 04/09/2024, 11:40 AM  Koirala, Dibas, MD  Patient coming from:   HOME  I have personally reviewed patient's medical records, in electronic medical records, including:  [facility], and care everywhere.    Chief Complaint:   Chief Complaint  Patient presents with   Shortness of Breath    History of present illness:    Martha Schneider is a 58 year old female with history of sarcoidosis, emphysema, smoker, seizure disorder hypertension, chronic vocal cord polyp-hoarseness, chronic leukopenia.  Presenting once again to the ED with chief complaint of shortness of breath. Per EDP EMS was called today found her satting in 70s on room air, wheezing with stridors. Patient received 2 DuoNeb breathing treatment and IV Solu-Medrol  125 mg prior to arrival.  Patient now satting greater 94% on 2 L of oxygen.  Note: Patient had a recent ED visit where she left AMA On prior visit patient had an extensive cardiac workup including echo and cardiac cath revealing normal studies with ejection fraction of 50-55%, with exception of severe aortic regurgitation Patient left AMA prior to admission, medication, inhalers, supplemental oxygen, and follow-up appointment for cardiology and ENT  ED Evaluation: Blood pressure (!) 156/90, pulse 86, temperature 97.9 F (36.6 C), temperature source Oral, height 5' 3 (1.6 m), weight 68 kg, SpO2 100% on 2 L of oxygen  Labs: CBC WBC 3.2, eosinophil normal at 1, neutrophils normal at 63, BMP sodium 130, chloride 95, CO2 20, glucose 106, ionized calcium  0.3, troponin 64 Respiratory panel influenza A, B RSV, COVID all negative Chest x-rayMild cardiomegaly without signs of congestion,    Patient Denies having: Fever, Chills, Chest Pain, Abd pain, N/V/D, headache, dizziness,  lightheadedness,  Dysuria, Joint pain, rash, open wounds   Review of Systems: As per HPI, otherwise 10 point review of systems were negative.   ----------------------------------------------------------------------------------------------------------------------  Allergies[1]  Home MEDs:  Prior to Admission medications  Medication Sig Start Date End Date Taking? Authorizing Provider  acetaminophen  (TYLENOL ) 500 MG tablet Take 500 mg by mouth every 6 (six) hours as needed.    [provider]  amLODipine  (NORVASC ) 10 MG tablet Take 10 mg by mouth daily. 01/20/21   [provider]  aspirin  325 MG tablet Take 325 mg by mouth daily.    [provider]  cloBAZam  (ONFI ) 10 MG tablet Take 1 tablet (10 mg total) by mouth at bedtime. 03/10/24 09/06/24  Gregg Lek, MD  diazePAM , 15 MG Dose, (VALTOCO  15 MG DOSE) 2 x 7.5 MG/0.1ML LQPK Place 15 mg into the nose as needed (for seizure). Please provide a total of 5 box/10 doses 03/10/24   Gregg Lek, MD  lacosamide  (VIMPAT ) 200 MG TABS tablet Take 1 tablet (200 mg total) by mouth 2 (two) times daily. 03/10/24   Camara, Amadou, MD  levETIRAcetam  (KEPPRA ) 500 MG tablet Take 2 tablets (1,000 mg total) by mouth 2 (two) times daily. 03/10/24   Camara, Amadou, MD  nicotine  (NICODERM CQ  - DOSED IN MG/24 HOURS) 14 mg/24hr patch  Place 1 patch (14 mg total) onto the skin daily. Patient not taking: Reported on 04/06/2024 04/17/22   Claudene Maximino LABOR, MD  sertraline  (ZOLOFT ) 100 MG tablet Take 1 tablet (100 mg total) by mouth daily. 06/04/22 08/18/24  Camara, Amadou, MD  SUMAtriptan  (IMITREX ) 50 MG tablet Take 1 tablet (50 mg total) by mouth daily as needed for migraine (may repeat if migraine persists in 2 hours, not to exceed 2 tablets in 1 day). May repeat in 2 hours if headache persists or recurs. 03/10/24   Camara, Amadou, MD  UNKNOWN TO PATIENT Place 3 drops into both eyes See admin instructions. Unnamed eye drops (otc): Instill 3 drops  into both eyes every morning- for irritation and/or allergies    [provider]  verapamil  (VERELAN ) 120 MG 24 hr capsule TAKE 1 CAPSULE (120 MG TOTAL) BY MOUTH AT BEDTIME. Patient taking differently: Take 120 mg by mouth at bedtime as needed. 10/13/23   Camara, Amadou, MD    PRN MEDs: acetaminophen  **OR** acetaminophen , bisacodyl, hydrALAZINE , HYDROmorphone (DILAUDID) injection, ipratropium, ondansetron  **OR** ondansetron  (ZOFRAN ) IV, oxyCODONE, senna-docusate, sodium phosphate , SUMAtriptan , traZODone  Past Medical History:  Diagnosis Date   Depression    Hypertension    Sarcoidosis    Seizures (HCC)     Past Surgical History:  Procedure Laterality Date   RIGHT/LEFT HEART CATH AND CORONARY ANGIOGRAPHY N/A 04/07/2024   Procedure: RIGHT/LEFT HEART CATH AND CORONARY ANGIOGRAPHY;  Surgeon: Anner Alm ORN, MD;  Location[facility] INVASIVE CV LAB;  Service: Cardiovascular;  Laterality: N/A;     reports that she has been smoking cigarettes. She has a 18.5 pack-year smoking history. She has never used smokeless tobacco. She reports current alcohol use. She reports that she does not use drugs.   Family History  Problem Relation Age of Onset   Heart attack Mother    Liver cancer Father    Diabetes Mellitus II Sister    Kidney disease Sister     Physical Exam:   Vitals:   04/09/24 0731  BP: (!) 156/90  Pulse: 86  Temp: 97.9 F (36.6 C)  TempSrc: Oral  SpO2: 100%  Weight: 68 kg  Height: 5' 3 (1.6 m)   Constitutional: NAD, calm, comfortable Eyes: PERRL, lids and conjunctivae normal ENMT: Mucous membranes are moist. Posterior pharynx clear of any exudate or lesions.Normal dentition.  Neck: normal, supple, no masses, no thyromegaly Respiratory: Positive for inspiratory/expiratory stridorous, diffuse sever wheezing, rhonchi, no crackles,  Diminished breath sounds in bilateral lower lobes cardiovascular: Regular rate and rhythm, no murmurs / rubs / gallops. No extremity  edema. 2+ pedal pulses. No carotid bruits.  Abdomen: no tenderness, no masses palpated. No hepatosplenomegaly. Bowel sounds positive.  Musculoskeletal: no clubbing / cyanosis. No joint deformity upper and lower extremities. Good ROM, no contractures. Normal muscle tone.  Neurologic: CN II-XII grossly intact. Sensation intact, DTR normal. Strength 5/5 in all 4.  Psychiatric: Normal judgment and insight. Alert and oriented x 3. Normal mood.  Skin: no rashes, lesions, ulcers. No induration    Labs on admission:    I have personally reviewed following labs and imaging studies  CBC: Recent Labs  Lab 04/06/24 1615 04/06/24 1706 04/07/24 0339 04/07/24 1546 04/07/24 1554 04/07/24 1555 04/09/24 0734 04/09/24 0821  WBC 2.9*  --  3.1*  --   --   --  3.2*  --   NEUTROABS  --   --  2.2  --   --   --  2.0  --  HGB 15.5*   < > 14.4 13.3 13.6 13.6 14.1 14.6  HCT 47.4*   < > 43.4 39.0 40.0 40.0 42.8 43.0  MCV 89.8  --  88.0  --   --   --  89.2  --   PLT 233  --  249  --   --   --  207  --    < > = values in this interval not displayed.   Basic Metabolic Panel: Recent Labs  Lab 04/06/24 1615 04/06/24 1706 04/06/24 1829 04/07/24 0339 04/07/24 1546 04/07/24 1554 04/07/24 1555 04/09/24 0734 04/09/24 0821  NA 139 139  --  136 136 134* 135 130* 130*  K 4.4 4.3  --  3.9 3.7 3.7 3.7 3.7 3.6  CL 99 99  --  99  --   --   --  95*  --   CO2 28  --   --  25  --   --   --  20*  --   GLUCOSE 108* 104*  --  190*  --   --   --  106*  --   BUN 8 8  --  16  --   --   --  <5*  --   CREATININE 0.84 0.90  --  0.99  --   --   --  0.55  --   CALCIUM  9.7  --   --  9.1  --   --   --  8.3*  --   MG  --   --  2.1  --   --   --   --   --   --    GFR: Estimated Creatinine Clearance: 70 mL/min (by C-G formula based on SCr of 0.55 mg/dL). Liver Function Tests: Recent Labs  Lab 04/06/24 1615  AST 39  ALT 17  ALKPHOS 101  BILITOT 0.6  PROT 8.9*  ALBUMIN 4.7    Urine analysis:    Component Value  Date/Time   COLORURINE YELLOW 11/25/2022 2132   APPEARANCEUR CLEAR 11/25/2022 2132   LABSPEC 1.013 11/25/2022 2132   PHURINE 6.0 11/25/2022 2132   GLUCOSEU NEGATIVE 11/25/2022 2132   HGBUR NEGATIVE 11/25/2022 2132   BILIRUBINUR NEGATIVE 11/25/2022 2132   BILIRUBINUR Negative 12/17/2013 1608   KETONESUR 5 (A) 11/25/2022 2132   PROTEINUR 100 (A) 11/25/2022 2132   UROBILINOGEN 0.2 12/17/2013 1608   NITRITE NEGATIVE 11/25/2022 2132   LEUKOCYTESUR TRACE (A) 11/25/2022 2132    Last A1C:  Lab Results  Component Value Date   HGBA1C 5.7 (H) 12/17/2013     Radiologic Exams on Admission:   DG Chest Port 1 View Result Date: 04/09/2024 EXAM: 1 VIEW(S) XRAY OF THE CHEST 04/09/2024 07:48:00 AM COMPARISON: Portable chest 04/06/2024. CLINICAL HISTORY: SOB. FINDINGS: LINES, TUBES AND DEVICES: Telemetry leads overlie the chest as before. LUNGS AND PLEURA: There is low inspiration on the exam with increased basilar bronchovascular crowding. No focal pneumonia is evident, with limited view of the bases. No pleural effusion. No pneumothorax. HEART AND MEDIASTINUM: Mild cardiomegaly. No vascular congestion is seen. The mediastinum is stable with aortic tortuosity and atherosclerosis. BONES AND SOFT TISSUES: No acute osseous abnormality. IMPRESSION: 1. No acute cardiopulmonary process identified. Low inspiratory effort. 2. Mild cardiomegaly without signs of congestion. Electronically signed by: Francis Quam MD 04/09/2024 07:56 AM EST RP Workstation: [id number]   CARDIAC CATHETERIZATION Result Date: 04/07/2024 Table formatting from the original result was not included. Images from the original result were not included.  Dominance: Right    Angiographically normal coronary arteries with borderline Pulmonary Arterial Pressures. (Mean PAP 23 mmHg and LVEDP/PCWP 18 to 20 mmHg) Mild to moderately reduced Cardiac Output-Index by Fick (4.33-2.52)  RECOMMENDATIONS   Anticipated discharge date to be determined.   After  discussion with Dr. Darryle Decent, plan will be to pursue cardiac MRI to evaluate for myocarditis   No indication for antiplatelet therapy at this time . Alm MICAEL Clay, MD, MS Alm Clay, M.D., M.S. Interventional Cardiologist [facility] Pager # [phone]   EKG:   Independently reviewed.  Orders placed or performed during the hospital encounter of 04/09/24   EKG 12-Lead   EKG 12-Lead   EKG 12-Lead   EKG 12-Lead   EKG 12-Lead   ---------------------------------------------------------------------------------------------------------------------------------------    Assessment / Plan:   Principal Problem:   Acute hypoxic respiratory failure (HCC) Active Problems:   Sarcoidosis   SOB (shortness of breath)   Neutropenia   Hypertension   Depression   Seizure disorder (HCC)   Hypocalcemia   Elevated troponin   Tobacco abuse   Hyponatremia   Assessment and Plan: * Acute hypoxic respiratory failure (HCC) Shortness numbness of breath with hypoxia-satting 70% on room air -was on 5 now on  2 L of oxygen, satting 100%  -Multifactorial including emphysema, undiagnosed COPD, chronic tobacco use, sarcoidosis -vocal cord polyps  -Will continue with DuoNeb bronchodilator treatments scheduled and as needed - Will initiate Pulmicort  nebs twice daily -She has received 125 mg of IV Solu-Medrol , will continue with 80 mg twice daily -Will encourage use of incentive spirometer, flutter valve   Sarcoidosis Will need outpatient follow-up with PCP, likely pulmonologist -Continue IV steroids  Elevated troponin Denies any chest pain, - status post recent evaluation by cardiology, status post cardiac cath, echocardiogram with EF 50-55% -workup within normal limits with exception of severe aortic regurgitation Per cardiology follow-up as an outpatient-no aggressive treatment   Hypocalcemia Serum calcium  8.3, ionized calcium  0.93 -Monitoring replating  accordingly  Seizure disorder (HCC) Home medications reviewed, continue Vimpat  and Keppra  No recent history of seizures  Depression Will continue sertraline   Hypertension - Elevated BP noted, patient noncompliant with medications -Home meds will be reviewed: Currently listed Norvasc ,   Neutropenia Chronic neutropenic of unknown etiology -No signs of infection  Tobacco abuse Patient was advised on consult extensive regarding smoking cessation -Current patch has been provided  Hyponatremia Hyponatremia, serum sodium level fluctuating between 135-130, 130 -Multifactorial, poor oral intake, nutrition deficiency, sarcoidosis and seizure medication -Will monitor closely (SIADH workup)       Consults called:  SW/ TOC /PT/OT Patient will need home O2, close follow-up with cardiology, ENT,  -------------------------------------------------------------------------------------------------------------------------------------------- DVT prophylaxis:  heparin  injection 5,000 Units Start: 04/09/24 1400 SCDs Start: 04/09/24 1054   Code Status:   Code Status: Full Code   Admission status: Patient will be admitted as Observation, with a greater than 2 midnight length of stay. Level of care: Telemetry   Family Communication:  none at bedside  (The above findings and plan of care has been discussed with patient in detail, the patient expressed understanding and agreement of above plan)  --------------------------------------------------------------------------------------------------------------------------------------------------  Disposition Plan:  Anticipated 1-2 days Status is: Observation The patient remains OBS appropriate and will d/c before 2 midnights.     ----------------------------------------------------------------------------------------------------------------------------------------------------  Time spent:  81  Min.  Was spent seeing and evaluating the  patient, reviewing all medical records, drawn plan of care.  SIGNED: Adriana DELENA Grams, MD, FHM. FAAFP. Jolynn Pack -  [facility], Pager  (Please use [email or web address] to page/ or secure chat through epic) If 7PM-7AM, please contact night-coverage [email or web address],  04/09/2024, 11:40 AM     [1] No Known Allergies

## 2024-04-09 NOTE — TOC CM/SW Note (Signed)
 TOC consult received for d/c planning needs. Patient recently left AMA from hospital admission on 12/10. Follow-up to be completed with patient as appropriate.   Merilee Batty, MSN, RN Case Management [phone]

## 2024-04-09 NOTE — ED Triage Notes (Signed)
 PT BIB [facility] from home after C/C SOB began this AM. Pt recently discharged Wed from hospital prior to studies/scans being completed for flare up of same s/s. PT received 2 duoneb and 125 solumedrol en route with EMS. Spo2 65% with EMS arrival, arrives at 100% on 5L [place].Aox4.  EMS VS: BP 162/102, HR 82, RR30

## 2024-04-09 NOTE — Assessment & Plan Note (Signed)
 Home medications reviewed, continue Vimpat  and Keppra  No recent history of seizures

## 2024-04-10 DIAGNOSIS — J9601 Acute respiratory failure with hypoxia: Principal | ICD-10-CM

## 2024-04-10 DIAGNOSIS — D869 Sarcoidosis, unspecified: Secondary | ICD-10-CM

## 2024-04-10 DIAGNOSIS — J441 Chronic obstructive pulmonary disease with (acute) exacerbation: Secondary | ICD-10-CM

## 2024-04-10 LAB — BASIC METABOLIC PANEL WITH GFR
Anion gap: 8 (ref 5–15)
BUN: 8 mg/dL (ref 6–20)
CO2: 27 mmol/L (ref 22–32)
Calcium: 8.9 mg/dL (ref 8.9–10.3)
Chloride: 98 mmol/L (ref 98–111)
Creatinine, Ser: 0.79 mg/dL (ref 0.44–1.00)
GFR, Estimated: >60 mL/min (ref >60)
Glucose, Bld: 169 mg/dL — ABNORMAL HIGH (ref 70–99)
Potassium: 4.3 mmol/L (ref 3.5–5.1)
Sodium: 133 mmol/L — ABNORMAL LOW (ref 135–145)

## 2024-04-10 LAB — APTT: aPTT: 32 s (ref 24–36)

## 2024-04-10 LAB — GLUCOSE, CAPILLARY
Glucose-Capillary: 166 mg/dL — ABNORMAL HIGH (ref 70–99)
Glucose-Capillary: 168 mg/dL — ABNORMAL HIGH (ref 70–99)

## 2024-04-10 MED ORDER — ASPIRIN 81 MG PO TBEC: 81.0000 mg | DELAYED_RELEASE_TABLET | Freq: Every day | ORAL | Status: DC

## 2024-04-10 MED ORDER — LEVETIRACETAM 500 MG PO TABS
1000.0000 mg | ORAL_TABLET | Freq: Two times a day (BID) | ORAL | Status: DC
  Administered 2024-04-10: 1000 mg via ORAL
  Filled 2024-04-10: qty 2

## 2024-04-10 MED ORDER — BUDESONIDE-FORMOTEROL FUMARATE 160-4.5 MCG/ACT IN AERO: 2.0000 | INHALATION_SPRAY | Freq: Two times a day (BID) | RESPIRATORY_TRACT | 12 refills | Status: AC

## 2024-04-10 MED ORDER — MAGNESIUM SULFATE 2 GM/50ML IV SOLN
2.0000 g | Freq: Once | INTRAVENOUS | Status: AC
  Administered 2024-04-10: 2 g via INTRAVENOUS
  Filled 2024-04-10: qty 50

## 2024-04-10 MED ORDER — LACOSAMIDE 200 MG PO TABS
200.0000 mg | ORAL_TABLET | Freq: Two times a day (BID) | ORAL | Status: DC
  Administered 2024-04-10: 200 mg via ORAL
  Filled 2024-04-10: qty 1

## 2024-04-10 MED ORDER — IPRATROPIUM-ALBUTEROL 0.5-2.5 (3) MG/3ML IN SOLN: 3.0000 mL | Freq: Two times a day (BID) | RESPIRATORY_TRACT | Status: DC

## 2024-04-10 MED ORDER — IPRATROPIUM-ALBUTEROL 0.5-2.5 (3) MG/3ML IN SOLN: 3.0000 mL | Freq: Two times a day (BID) | RESPIRATORY_TRACT | 12 refills | Status: AC

## 2024-04-10 MED ORDER — PREDNISONE 10 MG PO TABS: ORAL_TABLET | ORAL | 0 refills | Status: AC

## 2024-04-10 MED ORDER — ALBUTEROL SULFATE HFA 108 (90 BASE) MCG/ACT IN AERS: 2.0000 | INHALATION_SPRAY | Freq: Every day | RESPIRATORY_TRACT | 0 refills | Status: AC | PRN

## 2024-04-10 MED ADMIN — Sertraline HCl Tab 100 MG: 100 mg | ORAL | NDC 16714061304

## 2024-04-10 MED ADMIN — Guaifenesin Liquid 100 MG/5ML: 10 mL | ORAL | NDC 81033010210

## 2024-04-10 MED ADMIN — Budesonide Inhalation Susp 0.25 MG/2ML: 0.25 mg | RESPIRATORY_TRACT | NDC 00487960101

## 2024-04-10 MED ADMIN — Amlodipine Besylate Tab 10 MG (Base Equivalent): 10 mg | ORAL | NDC 00904637161

## 2024-04-10 NOTE — Plan of Care (Signed)

## 2024-04-10 NOTE — TOC Transition Note (Signed)
 Transition of Care [facility]) - Discharge Note   Patient Details  Name: Martha Schneider MRN: [id number] Date of Birth: 1965/06/29  Transition of [facility]) CM/SW Contact:  Robynn Eileen Hoose, RN Phone Number: 04/10/2024, 3:05 PM   Clinical Narrative:   Secure message from provider regarding patient needing home oxygen and nebulizer machine. DME ordered through Jermaine with [employer]. DME to be delivered to patient bedside prior to discharging home.          Patient Goals and CMS Choice            Discharge Placement                       Discharge Plan and Services Additional resources added to the After Visit Summary for                  DME Arranged: Nebulizer machine, Oxygen DME Agency: [employer] Date DME Agency Contacted: 04/10/24 Time DME Agency Contacted: 1501 Representative spoke with at DME Agency: [place]            Social Drivers of Health (SDOH) Interventions SDOH Screenings   Food Insecurity: Food Insecurity Present (04/09/2024)  Housing: High Risk (04/09/2024)  Transportation Needs: No Transportation Needs (04/09/2024)  Utilities: At Risk (04/09/2024)  Tobacco Use: High Risk (03/10/2024)     Readmission Risk Interventions     No data to display

## 2024-04-10 NOTE — Progress Notes (Signed)
 PT Cancellation Note  Patient Details Name: Martha Schneider MRN: [id number] DOB: 03/30/1966   Cancelled Treatment:    Reason Eval/Treat Not Completed: Patient evaluated and discharged yesterday from acute PT services. PT reordered today for ambulatory saturations; spoke with RN who can complete this task. Will discontinue order.  Deaaron Fulghum, PT, DPT Acute Rehabilitation Services  Personal: Secure Chat Rehab Office: [phone]  [name] 04/10/2024, 10:32 AM

## 2024-04-10 NOTE — Progress Notes (Addendum)
 SATURATION QUALIFICATIONS: (This note is used to comply with regulatory documentation for home oxygen)  Patient Saturations on Room Air at Rest = 96%  Patient Saturations on Room Air while Ambulating = 83%  Patient Saturations on 2 Liters of oxygen while Ambulating = 97%  Martha Schneider is a 58 y.o. female with a hx of tobacco abuse, sarcoidosis with emphysema, vocal cord polyps, hypertension, chronic leukopenia, and seizures.  Admitted on 04/09/2024 with Shortness of Breath, with oxygen saturations in the 70s.

## 2024-04-10 NOTE — Plan of Care (Signed)

## 2024-04-11 ENCOUNTER — Other Ambulatory Visit: Payer: Self-pay

## 2024-04-11 ENCOUNTER — Encounter (HOSPITAL_COMMUNITY): Payer: Self-pay

## 2024-04-11 ENCOUNTER — Emergency Department (HOSPITAL_COMMUNITY)

## 2024-04-11 ENCOUNTER — Observation Stay (HOSPITAL_COMMUNITY)
Admission: EM | Admit: 2024-04-11 | Discharge: 2024-04-12 | Disposition: A | Source: Home / Self Care | Attending: Family Medicine | Admitting: Family Medicine

## 2024-04-11 DIAGNOSIS — Z7982 Long term (current) use of aspirin: Secondary | ICD-10-CM | POA: Diagnosis not present

## 2024-04-11 DIAGNOSIS — G40909 Epilepsy, unspecified, not intractable, without status epilepticus: Secondary | ICD-10-CM

## 2024-04-11 DIAGNOSIS — J441 Chronic obstructive pulmonary disease with (acute) exacerbation: Secondary | ICD-10-CM | POA: Diagnosis not present

## 2024-04-11 DIAGNOSIS — I10 Essential (primary) hypertension: Secondary | ICD-10-CM | POA: Diagnosis present

## 2024-04-11 DIAGNOSIS — R0789 Other chest pain: Secondary | ICD-10-CM | POA: Diagnosis not present

## 2024-04-11 DIAGNOSIS — J9601 Acute respiratory failure with hypoxia: Secondary | ICD-10-CM | POA: Diagnosis present

## 2024-04-11 DIAGNOSIS — R0609 Other forms of dyspnea: Secondary | ICD-10-CM | POA: Diagnosis present

## 2024-04-11 DIAGNOSIS — D869 Sarcoidosis, unspecified: Secondary | ICD-10-CM | POA: Diagnosis present

## 2024-04-11 DIAGNOSIS — R0602 Shortness of breath: Secondary | ICD-10-CM | POA: Diagnosis present

## 2024-04-11 DIAGNOSIS — F32A Depression, unspecified: Secondary | ICD-10-CM | POA: Diagnosis present

## 2024-04-11 DIAGNOSIS — Z79899 Other long term (current) drug therapy: Secondary | ICD-10-CM | POA: Diagnosis not present

## 2024-04-11 DIAGNOSIS — Z7951 Long term (current) use of inhaled steroids: Secondary | ICD-10-CM | POA: Diagnosis not present

## 2024-04-11 DIAGNOSIS — R7989 Other specified abnormal findings of blood chemistry: Secondary | ICD-10-CM | POA: Diagnosis present

## 2024-04-11 DIAGNOSIS — D72819 Decreased white blood cell count, unspecified: Secondary | ICD-10-CM | POA: Diagnosis present

## 2024-04-11 LAB — RESP PANEL BY RT-PCR (RSV, FLU A&B, COVID)  RVPGX2
Influenza A by PCR: NEGATIVE
Influenza B by PCR: NEGATIVE
Resp Syncytial Virus by PCR: NEGATIVE
SARS Coronavirus 2 by RT PCR: NEGATIVE

## 2024-04-11 LAB — CBC WITH DIFFERENTIAL/PLATELET
Abs Immature Granulocytes: 0.07 K/uL (ref 0.00–0.07)
Basophils Absolute: 0 K/uL (ref 0.0–0.1)
Basophils Relative: 0 %
Eosinophils Absolute: 0 K/uL (ref 0.0–0.5)
Eosinophils Relative: 0 %
HCT: 45.8 % (ref 36.0–46.0)
Hemoglobin: 15.3 g/dL — ABNORMAL HIGH (ref 12.0–15.0)
Immature Granulocytes: 1 %
Lymphocytes Relative: 19 %
Lymphs Abs: 1.4 K/uL (ref 0.7–4.0)
MCH: 29.5 pg (ref 26.0–34.0)
MCHC: 33.4 g/dL (ref 30.0–36.0)
MCV: 88.4 fL (ref 80.0–100.0)
Monocytes Absolute: 1 K/uL (ref 0.1–1.0)
Monocytes Relative: 13 %
Neutro Abs: 4.9 K/uL (ref 1.7–7.7)
Neutrophils Relative %: 67 %
Platelets: 294 K/uL (ref 150–400)
RBC: 5.18 MIL/uL — ABNORMAL HIGH (ref 3.87–5.11)
RDW: 14 % (ref 11.5–15.5)
WBC: 7.4 K/uL (ref 4.0–10.5)
nRBC: 0 % (ref 0.0–0.2)

## 2024-04-11 LAB — COMPREHENSIVE METABOLIC PANEL WITH GFR
ALT: 29 U/L (ref 0–44)
AST: 41 U/L (ref 15–41)
Albumin: 4.4 g/dL (ref 3.5–5.0)
Alkaline Phosphatase: 74 U/L (ref 38–126)
Anion gap: 11 (ref 5–15)
BUN: 5 mg/dL — ABNORMAL LOW (ref 6–20)
CO2: 28 mmol/L (ref 22–32)
Calcium: 9.1 mg/dL (ref 8.9–10.3)
Chloride: 96 mmol/L — ABNORMAL LOW (ref 98–111)
Creatinine, Ser: 0.68 mg/dL (ref 0.44–1.00)
GFR, Estimated: >60 mL/min (ref >60)
Glucose, Bld: 121 mg/dL — ABNORMAL HIGH (ref 70–99)
Potassium: 4.3 mmol/L (ref 3.5–5.1)
Sodium: 135 mmol/L (ref 135–145)
Total Bilirubin: 0.7 mg/dL (ref 0.0–1.2)
Total Protein: 8.7 g/dL — ABNORMAL HIGH (ref 6.5–8.1)

## 2024-04-11 LAB — TROPONIN I (HIGH SENSITIVITY)
Troponin I (High Sensitivity): 93 ng/L — ABNORMAL HIGH (ref <18)
Troponin I (High Sensitivity): 101 ng/L (ref <18)

## 2024-04-11 MED ORDER — HEPARIN SODIUM (PORCINE) 5000 UNIT/ML IJ SOLN
5000.0000 [IU] | Freq: Three times a day (TID) | INTRAMUSCULAR | Status: DC
  Administered 2024-04-11 – 2024-04-12 (×5): 5000 [IU] via SUBCUTANEOUS
  Filled 2024-04-11 (×5): qty 1

## 2024-04-11 MED ORDER — METHYLPREDNISOLONE SODIUM SUCC 125 MG IJ SOLR
80.0000 mg | Freq: Two times a day (BID) | INTRAMUSCULAR | Status: DC
  Administered 2024-04-11 – 2024-04-12 (×3): 80 mg via INTRAVENOUS
  Filled 2024-04-11 (×3): qty 2

## 2024-04-11 MED ORDER — BUDESONIDE 0.5 MG/2ML IN SUSP
0.5000 mg | Freq: Two times a day (BID) | RESPIRATORY_TRACT | Status: DC
  Administered 2024-04-11 – 2024-04-12 (×3): 0.5 mg via RESPIRATORY_TRACT
  Filled 2024-04-11 (×3): qty 2

## 2024-04-11 MED ORDER — LACOSAMIDE 50 MG PO TABS
200.0000 mg | ORAL_TABLET | Freq: Two times a day (BID) | ORAL | Status: DC
  Administered 2024-04-11 – 2024-04-12 (×3): 200 mg via ORAL
  Filled 2024-04-11 (×3): qty 4

## 2024-04-11 MED ORDER — OXYCODONE HCL 5 MG PO TABS
5.0000 mg | ORAL_TABLET | ORAL | Status: DC | PRN
  Administered 2024-04-11: 5 mg via ORAL
  Filled 2024-04-11: qty 1

## 2024-04-11 MED ORDER — LORAZEPAM 2 MG/ML IJ SOLN
1.0000 mg | Freq: Once | INTRAMUSCULAR | Status: AC
  Administered 2024-04-11: 1 mg via INTRAVENOUS
  Filled 2024-04-11: qty 1

## 2024-04-11 MED ORDER — IPRATROPIUM-ALBUTEROL 0.5-2.5 (3) MG/3ML IN SOLN
3.0000 mL | Freq: Four times a day (QID) | RESPIRATORY_TRACT | Status: DC
  Administered 2024-04-11 – 2024-04-12 (×6): 3 mL via RESPIRATORY_TRACT
  Filled 2024-04-11 (×6): qty 3

## 2024-04-11 MED ORDER — IPRATROPIUM BROMIDE 0.02 % IN SOLN: 0.5000 mg | Freq: Four times a day (QID) | RESPIRATORY_TRACT | Status: DC | PRN

## 2024-04-11 MED ORDER — BISACODYL 5 MG PO TBEC: 5.0000 mg | DELAYED_RELEASE_TABLET | Freq: Every day | ORAL | Status: DC | PRN

## 2024-04-11 MED ORDER — GUAIFENESIN 100 MG/5ML PO LIQD
10.0000 mL | Freq: Three times a day (TID) | ORAL | Status: DC
  Administered 2024-04-11 – 2024-04-12 (×5): 10 mL via ORAL
  Filled 2024-04-11 (×5): qty 10

## 2024-04-11 MED ORDER — FLEET ENEMA RE ENEM: 1.0000 | ENEMA | Freq: Once | RECTAL | Status: DC | PRN

## 2024-04-11 MED ORDER — SODIUM CHLORIDE 0.9% FLUSH
3.0000 mL | Freq: Two times a day (BID) | INTRAVENOUS | Status: DC
  Administered 2024-04-12: 10:00:00 3 mL via INTRAVENOUS

## 2024-04-11 MED ORDER — IPRATROPIUM-ALBUTEROL 0.5-2.5 (3) MG/3ML IN SOLN
3.0000 mL | Freq: Once | RESPIRATORY_TRACT | Status: AC
  Administered 2024-04-11: 3 mL via RESPIRATORY_TRACT
  Filled 2024-04-11: qty 3

## 2024-04-11 MED ORDER — ONDANSETRON HCL 4 MG/2ML IJ SOLN: 4.0000 mg | Freq: Four times a day (QID) | INTRAMUSCULAR | Status: DC | PRN

## 2024-04-11 MED ORDER — LEVETIRACETAM 500 MG PO TABS
1000.0000 mg | ORAL_TABLET | Freq: Two times a day (BID) | ORAL | Status: DC
  Administered 2024-04-11 – 2024-04-12 (×3): 1000 mg via ORAL
  Filled 2024-04-11 (×3): qty 2

## 2024-04-11 MED ORDER — ASPIRIN 81 MG PO TBEC
81.0000 mg | DELAYED_RELEASE_TABLET | Freq: Every day | ORAL | Status: DC
  Administered 2024-04-11 – 2024-04-12 (×2): 81 mg via ORAL
  Filled 2024-04-11 (×2): qty 1

## 2024-04-11 MED ORDER — HYDROMORPHONE HCL 1 MG/ML IJ SOLN: 0.5000 mg | INTRAMUSCULAR | Status: DC | PRN

## 2024-04-11 MED ORDER — SENNOSIDES-DOCUSATE SODIUM 8.6-50 MG PO TABS: 1.0000 | ORAL_TABLET | Freq: Every evening | ORAL | Status: DC | PRN

## 2024-04-11 MED ORDER — SODIUM CHLORIDE 0.9 % IV SOLN
1.0000 g | Freq: Once | INTRAVENOUS | Status: AC
  Administered 2024-04-11: 1 g via INTRAVENOUS
  Filled 2024-04-11: qty 10

## 2024-04-11 MED ORDER — METHYLPREDNISOLONE SODIUM SUCC 125 MG IJ SOLR
125.0000 mg | Freq: Once | INTRAMUSCULAR | Status: AC
  Administered 2024-04-11: 125 mg via INTRAVENOUS
  Filled 2024-04-11: qty 2

## 2024-04-11 MED ORDER — TRAZODONE HCL 50 MG PO TABS: 25.0000 mg | ORAL_TABLET | Freq: Every evening | ORAL | Status: DC | PRN

## 2024-04-11 MED ORDER — IPRATROPIUM-ALBUTEROL 0.5-2.5 (3) MG/3ML IN SOLN
RESPIRATORY_TRACT | Status: AC
  Filled 2024-04-11: qty 12

## 2024-04-11 MED ORDER — SODIUM CHLORIDE 0.9% FLUSH: 3.0000 mL | Freq: Two times a day (BID) | INTRAVENOUS | Status: DC

## 2024-04-11 MED ORDER — ACETAMINOPHEN 650 MG RE SUPP: 650.0000 mg | Freq: Four times a day (QID) | RECTAL | Status: DC | PRN

## 2024-04-11 MED ORDER — SUMATRIPTAN SUCCINATE 50 MG PO TABS: 50.0000 mg | ORAL_TABLET | Freq: Every day | ORAL | Status: DC | PRN

## 2024-04-11 MED ORDER — ACETAMINOPHEN 325 MG PO TABS: 650.0000 mg | ORAL_TABLET | Freq: Four times a day (QID) | ORAL | Status: DC | PRN

## 2024-04-11 MED ORDER — SODIUM CHLORIDE 0.9 % IV SOLN
500.0000 mg | Freq: Once | INTRAVENOUS | Status: AC
  Administered 2024-04-11: 500 mg via INTRAVENOUS
  Filled 2024-04-11: qty 5

## 2024-04-11 MED ORDER — MAGNESIUM SULFATE 2 GM/50ML IV SOLN
2.0000 g | Freq: Once | INTRAVENOUS | Status: AC
  Administered 2024-04-11: 2 g via INTRAVENOUS
  Filled 2024-04-11: qty 50

## 2024-04-11 MED ORDER — AMLODIPINE BESYLATE 5 MG PO TABS
10.0000 mg | ORAL_TABLET | Freq: Every day | ORAL | Status: DC
  Administered 2024-04-11 – 2024-04-12 (×2): 10 mg via ORAL
  Filled 2024-04-11 (×2): qty 2

## 2024-04-11 MED ORDER — SERTRALINE HCL 100 MG PO TABS
100.0000 mg | ORAL_TABLET | Freq: Every day | ORAL | Status: DC
  Administered 2024-04-11 – 2024-04-12 (×2): 100 mg via ORAL
  Filled 2024-04-11 (×2): qty 1

## 2024-04-11 MED ORDER — SODIUM CHLORIDE 0.9 % IV SOLN: INTRAVENOUS | Status: DC

## 2024-04-11 MED ORDER — LEVOFLOXACIN 750 MG PO TABS
750.0000 mg | ORAL_TABLET | Freq: Every day | ORAL | Status: DC
  Administered 2024-04-12: 10:00:00 750 mg via ORAL
  Filled 2024-04-11: qty 1

## 2024-04-11 MED ORDER — ONDANSETRON HCL 4 MG PO TABS: 4.0000 mg | ORAL_TABLET | Freq: Four times a day (QID) | ORAL | Status: DC | PRN

## 2024-04-11 MED ORDER — HYDRALAZINE HCL 20 MG/ML IJ SOLN: 10.0000 mg | INTRAMUSCULAR | Status: DC | PRN

## 2024-04-11 NOTE — Discharge Summary (Incomplete)
 Physician Discharge Summary   Patient: Martha Schneider MRN: [id number] DOB: Jun 25, 1965  Admit date:     04/09/2024  Discharge date: {dischdate:26783}  Discharge Physician: Elgie Butter   PCP: Regino Slater, MD   Recommendations at discharge:  {Tip this will not be part of the note when signed- Example include specific recommendations for outpatient follow-up, pending tests to follow-up on. (Optional):26781}  ***  Discharge Diagnoses: Principal Problem:   Acute hypoxic respiratory failure (HCC) Active Problems:   Sarcoidosis   SOB (shortness of breath)   Neutropenia   Hypertension   Depression   Seizure disorder (HCC)   Hypocalcemia   Elevated troponin   Tobacco abuse   Hyponatremia  Resolved Problems:   Current every day smoker  Hospital Course: Martha Schneider is a 58 year old female with history of sarcoidosis, emphysema, smoker, seizure disorder hypertension, chronic vocal cord polyp-hoarseness, chronic leukopenia.  Presenting once again to the ED with chief complaint of shortness of breath. Per EDP EMS was called today found her satting in 70s on room air, wheezing with stridors. Patient received 2 DuoNeb breathing treatment and IV Solu-Medrol  125 mg prior to arrival.  Patient now satting greater 94% on 2 L of oxygen.  Note: Patient had a recent ED visit where she left AMA On prior visit patient had an extensive cardiac workup including echo and cardiac cath revealing normal studies with ejection fraction of 50-55%, with exception of severe aortic regurgitation Patient left AMA prior to admission, medication, inhalers, supplemental oxygen, and follow-up appointment for cardiology and ENT  ED Evaluation: Blood pressure (!) 156/90, pulse 86, temperature 97.9 F (36.6 C), temperature source Oral, height 5' 3 (1.6 m), weight 68 kg, SpO2 100% on 2 L of oxygen  Labs: CBC WBC 3.2, eosinophil normal at 1, neutrophils normal at 63, BMP sodium 130, chloride 95, CO2 20, glucose 106,  ionized calcium  0.3, troponin 64 Respiratory panel influenza A, B RSV, COVID all negative Chest x-rayMild cardiomegaly without signs of congestion,    Assessment and Plan: * Acute hypoxic respiratory failure (HCC) Shortness numbness of breath with hypoxia-satting 70% on room air -was on 5 now on  2 L of oxygen, satting 100%  -Multifactorial including emphysema, undiagnosed COPD, chronic tobacco use, sarcoidosis -vocal cord polyps  -Will continue with DuoNeb bronchodilator treatments scheduled and as needed - Will initiate Pulmicort  nebs twice daily -She has received 125 mg of IV Solu-Medrol , will continue with 80 mg twice daily -Will encourage use of incentive spirometer, flutter valve   Sarcoidosis Will need outpatient follow-up with PCP, likely pulmonologist -Continue IV steroids  Elevated troponin Denies any chest pain, - status post recent evaluation by cardiology, status post cardiac cath, echocardiogram with EF 50-55% -workup within normal limits with exception of severe aortic regurgitation Per cardiology follow-up as an outpatient-no aggressive treatment   Hypocalcemia Serum calcium  8.3, ionized calcium  0.93 -Monitoring replating accordingly  Seizure disorder (HCC) Home medications reviewed, continue Vimpat  and Keppra  No recent history of seizures  Depression Will continue sertraline   Hypertension - Elevated BP noted, patient noncompliant with medications -Home meds will be reviewed: Currently listed Norvasc ,   Neutropenia Chronic neutropenic of unknown etiology -No signs of infection  Tobacco abuse Patient was advised on consult extensive regarding smoking cessation -Current patch has been provided  Hyponatremia Hyponatremia, serum sodium level fluctuating between 135-130, 130 -Multifactorial, poor oral intake, nutrition deficiency, sarcoidosis and seizure medication -Will monitor closely (SIADH workup)      {Tip this will not be part of the  note  when signed Body mass index is 26.56 kg/m. , ,  (Optional):26781}  {(NOTE) Pain control PDMP Statment (Optional):26782} Consultants: *** Procedures performed: ***  Disposition: {Plan; Disposition:26390} Diet recommendation:  {Diet_Plan:26776} DISCHARGE MEDICATION: Allergies as of 04/10/2024   No Known Allergies      Medication List     STOP taking these medications    verapamil  120 MG 24 hr capsule Commonly known as: VERELAN        TAKE these medications    acetaminophen  500 MG tablet Commonly known as: TYLENOL  Take 1,000 mg by mouth daily as needed for mild pain (pain score 1-3) or moderate pain (pain score 4-6).   albuterol  108 (90 Base) MCG/ACT inhaler Commonly known as: VENTOLIN  HFA Inhale 2 puffs into the lungs daily as needed for wheezing or shortness of breath.   amLODipine  10 MG tablet Commonly known as: NORVASC  Take 10 mg by mouth daily.   aspirin  EC 81 MG tablet Take 81 mg by mouth daily. Swallow whole.   budesonide -formoterol  160-4.5 MCG/ACT inhaler Commonly known as: Symbicort  Inhale 2 puffs into the lungs 2 (two) times daily.   cloBAZam  10 MG tablet Commonly known as: ONFI  Take 1 tablet (10 mg total) by mouth at bedtime. What changed:  when to take this reasons to take this   guaiFENesin  600 MG 12 hr tablet Commonly known as: MUCINEX  Take 600 mg by mouth 2 (two) times daily as needed for cough or to loosen phlegm.   ipratropium-albuterol  0.5-2.5 (3) MG/3ML Soln Commonly known as: DUONEB Take 3 mLs by nebulization 2 (two) times daily.   lacosamide  200 MG Tabs tablet Commonly known as: VIMPAT  Take 1 tablet (200 mg total) by mouth 2 (two) times daily. What changed: when to take this   levETIRAcetam  500 MG tablet Commonly known as: KEPPRA  Take 2 tablets (1,000 mg total) by mouth 2 (two) times daily.   predniSONE  10 MG tablet Commonly known as: DELTASONE  Prednisone  60 mg daily for 3 days followed by  Prednisone  40 mg daily for 3 days  followed by  Prednisone  20 mg daily for 3 days followed by  Prednisone  10 mg daily for 3 days.   sertraline  100 MG tablet Commonly known as: ZOLOFT  Take 1 tablet (100 mg total) by mouth daily. What changed: when to take this   SUMAtriptan  50 MG tablet Commonly known as: IMITREX  Take 1 tablet (50 mg total) by mouth daily as needed for migraine (may repeat if migraine persists in 2 hours, not to exceed 2 tablets in 1 day). May repeat in 2 hours if headache persists or recurs.   Valtoco  15 MG Dose 2 x 7.5 MG/0.1ML Lqpk Generic drug: diazePAM  (15 MG Dose) Place 15 mg into the nose as needed (for seizure). Please provide a total of 5 box/10 doses        Discharge Exam: Filed Weights   04/09/24 0731  Weight: 68 kg   ***  Condition at discharge: {DC Condition:26389}  The results of significant diagnostics from this hospitalization (including imaging, microbiology, ancillary and laboratory) are listed below for reference.   Imaging Studies: DG Chest Portable 1 View Result Date: 04/11/2024 CLINICAL DATA:  Shortness of breath. EXAM: PORTABLE CHEST 1 VIEW COMPARISON:  04/09/2024 FINDINGS: Focal consolidative opacity in the right base is new in the interval. Given rapid interval appearance this may be atelectatic although pneumonia is not excluded. Left lung clear. No pulmonary edema or substantial pleural effusion. The cardio pericardial silhouette is enlarged. IMPRESSION: New focal consolidative  opacity in the right base. Given rapid interval appearance this may be atelectatic although pneumonia is not excluded. Electronically Signed   By: Camellia Candle M.D.   On: 04/11/2024 06:34   DG Chest Port 1 View Result Date: 04/09/2024 EXAM: 1 VIEW(S) XRAY OF THE CHEST 04/09/2024 07:48:00 AM COMPARISON: Portable chest 04/06/2024. CLINICAL HISTORY: SOB. FINDINGS: LINES, TUBES AND DEVICES: Telemetry leads overlie the chest as before. LUNGS AND PLEURA: There is low inspiration on the exam with  increased basilar bronchovascular crowding. No focal pneumonia is evident, with limited view of the bases. No pleural effusion. No pneumothorax. HEART AND MEDIASTINUM: Mild cardiomegaly. No vascular congestion is seen. The mediastinum is stable with aortic tortuosity and atherosclerosis. BONES AND SOFT TISSUES: No acute osseous abnormality. IMPRESSION: 1. No acute cardiopulmonary process identified. Low inspiratory effort. 2. Mild cardiomegaly without signs of congestion. Electronically signed by: Francis Quam MD 04/09/2024 07:56 AM EST RP Workstation: [id number]   CARDIAC CATHETERIZATION Result Date: 04/07/2024 Table formatting from the original result was not included. Images from the original result were not included. Dominance: Right    Angiographically normal coronary arteries with borderline Pulmonary Arterial Pressures. (Mean PAP 23 mmHg and LVEDP/PCWP 18 to 20 mmHg) Mild to moderately reduced Cardiac Output-Index by Fick (4.33-2.52)  RECOMMENDATIONS   Anticipated discharge date to be determined.   After discussion with Dr. Darryle Decent, plan will be to pursue cardiac MRI to evaluate for myocarditis   No indication for antiplatelet therapy at this time . Alm MICAEL Clay, MD, MS Alm Clay, M.D., M.S. Interventional Cardiologist [facility] Pager # [phone]  ECHOCARDIOGRAM COMPLETE Result Date: 04/07/2024    ECHOCARDIOGRAM REPORT   Patient Name:   ZAMIYA DILLARD Date of Exam: 04/07/2024 Medical Rec #:  [id number]    Height:       63.0 in Accession #:    [id number]   Weight:       151.2 lb Date of Birth:  [birth year]     BSA:          1.717 m Patient Age:    58 years     BP:           129/85 mmHg Patient Gender: F            HR:           76 bpm. Exam Location:  Inpatient Procedure: 2D Echo, Cardiac Doppler, Color Doppler, Strain Analysis and            Intracardiac Opacification Agent (Both Spectral and Color Flow            Doppler were utilized during procedure). Indications:    R06.02  SOB. Elevated troponin  History:        Patient has no prior history of Echocardiogram examinations.                 Risk Factors:Hypertension. Sarcoidosis.  Sonographer:    Ellouise Mose RDCS Referring Phys: [id number] VISHAL R PATEL  Sonographer Comments: Technically difficult study due to poor echo windows. Patient has severe hoarseness, corrupted SSN doppler. IMPRESSIONS  1. The aortic valve is tricuspid. Aortic valve regurgitation is severe. No aortic stenosis is present.  2. Left ventricular ejection fraction, by estimation, is 50 to 55%. The left ventricle has low normal function. The left ventricle demonstrates regional wall motion abnormalities (see scoring diagram/findings for description). There is moderate left ventricular hypertrophy. Indeterminate diastolic filling due to E-A fusion. The average left ventricular global longitudinal strain is -  14.9 %. The global longitudinal strain is abnormal.  3. Right ventricular systolic function is mildly reduced. The right ventricular size is normal.  4. Moderate pleural effusion.  5. The mitral valve is normal in structure. No evidence of mitral valve regurgitation. No evidence of mitral stenosis.  6. The inferior vena cava is normal in size with greater than 50% respiratory variability, suggesting right atrial pressure of 3 mmHg. FINDINGS  Left Ventricle: Left ventricular ejection fraction, by estimation, is 50 to 55%. The left ventricle has low normal function. The left ventricle demonstrates regional wall motion abnormalities. Definity  contrast agent was given IV to delineate the left ventricular endocardial borders. The average left ventricular global longitudinal strain is -14.9 %. Strain was performed and the global longitudinal strain is abnormal. The left ventricular internal cavity size was normal in size. There is moderate left  ventricular hypertrophy. Indeterminate diastolic filling due to E-A fusion. Right Ventricle: The right ventricular size is normal. No  increase in right ventricular wall thickness. Right ventricular systolic function is mildly reduced. Left Atrium: Left atrial size was normal in size. Right Atrium: Right atrial size was normal in size. Pericardium: There is no evidence of pericardial effusion. Mitral Valve: The mitral valve is normal in structure. No evidence of mitral valve regurgitation. No evidence of mitral valve stenosis. Tricuspid Valve: The tricuspid valve is normal in structure. Tricuspid valve regurgitation is mild . No evidence of tricuspid stenosis. Aortic Valve: The aortic valve is tricuspid. Aortic valve regurgitation is severe. Aortic regurgitation PHT measures 579 msec. No aortic stenosis is present. Pulmonic Valve: The pulmonic valve was normal in structure. Pulmonic valve regurgitation is not visualized. No evidence of pulmonic stenosis. Aorta: The aortic root and ascending aorta are structurally normal, with no evidence of dilitation. Venous: The right upper pulmonary vein is normal. The inferior vena cava is normal in size with greater than 50% respiratory variability, suggesting right atrial pressure of 3 mmHg. IAS/Shunts: There is left bowing of the interatrial septum, suggestive of elevated right atrial pressure. No atrial level shunt detected by color flow Doppler. Additional Comments: There is a moderate pleural effusion.  LEFT VENTRICLE PLAX 2D LVIDd:         3.90 cm     Diastology LVIDs:         2.70 cm     LV e' medial:    3.70 cm/s LV PW:         2.00 cm     LV E/e' medial:  11.1 LV IVS:        1.50 cm     LV e' lateral:   4.68 cm/s LVOT diam:     2.30 cm     LV E/e' lateral: 8.8 LV SV:         95 LV SV Index:   55          2D Longitudinal Strain LVOT Area:     4.15 cm    2D Strain GLS Avg:     -14.9 %  LV Volumes (MOD) LV vol d, MOD A2C: 76.3 ml LV vol d, MOD A4C: 89.3 ml LV vol s, MOD A2C: 38.7 ml LV vol s, MOD A4C: 40.8 ml LV SV MOD A2C:     37.6 ml LV SV MOD A4C:     89.3 ml LV SV MOD BP:      45.4 ml RIGHT  VENTRICLE            IVC RV S prime:  8.81 cm/s  IVC diam: 1.70 cm TAPSE (M-mode): 1.3 cm                            PULMONARY VEINS                            Diastolic Velocity: 40.70 cm/s                            S/D Velocity:       1.90                            Systolic Velocity:  77.60 cm/s LEFT ATRIUM             Index        RIGHT ATRIUM           Index LA diam:        2.70 cm 1.57 cm/m   RA Area:     10.90 cm LA Vol (A2C):   18.5 ml 10.77 ml/m  RA Volume:   18.40 ml  10.72 ml/m LA Vol (A4C):   23.5 ml 13.69 ml/m LA Biplane Vol: 22.1 ml 12.87 ml/m  AORTIC VALVE LVOT Vmax:   115.00 cm/s LVOT Vmean:  77.100 cm/s LVOT VTI:    0.228 m AI PHT:      579 msec  AORTA Ao Root diam: 2.90 cm Ao Asc diam:  3.70 cm MITRAL VALVE               TRICUSPID VALVE MV Area (PHT): 3.59 cm    TR Peak grad:   24.4 mmHg MV Decel Time: 212 msec    TR Vmax:        247.00 cm/s MV E velocity: 41.00 cm/s MV A velocity: 68.85 cm/s  SHUNTS MV E/A ratio:  0.60        Systemic VTI:  0.23 m                            Systemic Diam: 2.30 cm Oneil Parchment MD Electronically signed by Oneil Parchment MD Signature Date/Time: 04/07/2024/12:40:16 PM    Final    CT Soft Tissue Neck W Contrast Result Date: 04/06/2024 EXAM: CT NECK WITH CONTRAST 04/06/2024 06:59:09 PM TECHNIQUE: CT of the neck was performed with the administration of 75 mL of iohexol  (OMNIPAQUE ) 350 MG/ML injection. Multiplanar reformatted images are provided for review. Automated exposure control, iterative reconstruction, and/or weight based adjustment of the mA/kV was utilized to reduce the radiation dose to as low as reasonably achievable. COMPARISON: Same day neck soft tissue radiograph. CLINICAL HISTORY: Epiglottitis or tonsillitis suspected. FINDINGS: AERODIGESTIVE TRACT: The nasopharynx is symmetric. Retropharyngeal course of the cervical internal carotid arteries which indents the posterior wall of the oropharynx. The oropharynx is otherwise unremarkable. Symmetric  appearance of the tonsils. No evidence of peritonsillar abscess. Normal appearance of the oral cavity and floor of mouth. Normal appearance of the base of tongue and epiglottis. No retropharyngeal fluid collection. The supraglottic airway is relatively symmetric. Symmetric appearance of the vocal folds. SALIVARY GLANDS: The parotid and submandibular glands are unremarkable. THYROID : Unremarkable. LYMPH NODES: No suspicious cervical lymphadenopathy. SOFT TISSUES: No mass or fluid collection. BRAIN, ORBITS, SINUSES AND MASTOIDS: Visualized portions of the paranasal sinuses and mastoid air  cells are clear. No acute abnormality. LUNGS AND MEDIASTINUM: Paraseptal emphysema at the right lung apex. No acute abnormality. BONES: Degenerative changes in the visualized spine. No focal bone abnormality. IMPRESSION: 1. No CT evidence of epiglottitis or tonsillitis. 2. No cervical lymphadenopathy. Electronically signed by: Donnice Mania MD 04/06/2024 07:23 PM EST RP Workstation: [id number]   CT Angio Chest PE W/Cm &/Or Wo Cm Result Date: 04/06/2024 CLINICAL DATA:  Shortness of breath. Concern for pulmonary embolism. EXAM: CT ANGIOGRAPHY CHEST WITH CONTRAST TECHNIQUE: Multidetector CT imaging of the chest was performed using the standard protocol during bolus administration of intravenous contrast. Multiplanar CT image reconstructions and MIPs were obtained to evaluate the vascular anatomy. RADIATION DOSE REDUCTION: This exam was performed according to the departmental dose-optimization program which includes automated exposure control, adjustment of the mA and/or kV according to patient size and/or use of iterative reconstruction technique. CONTRAST:  75mL OMNIPAQUE  IOHEXOL  350 MG/ML SOLN COMPARISON:  CT dated 11/10/2019. Chest radiograph dated 04/06/2024. FINDINGS: Cardiovascular: There is no cardiomegaly or pericardial effusion. The thoracic aorta is unremarkable. Mild dilatation of the main pulmonary trunk suggestive of  pulmonary hypertension. Clinical correlation recommended. No pulmonary artery embolus identified. Mediastinum/Nodes: No hilar or mediastinal adenopathy. The esophagus is grossly unremarkable. No mediastinal fluid collection. Lungs/Pleura: Background of emphysema. Bibasilar subpleural atelectasis/scarring. There is an 8 mm left lower lobe nodule along the fissure present on the prior CT of 2021. Right upper lobe reticular scarring. No consolidative changes. There is no pleural effusion pneumothorax. The central airways are patent. Upper Abdomen: No acute abnormality. Musculoskeletal: No acute osseous pathology. Review of the MIP images confirms the above findings. IMPRESSION: 1. No acute intrathoracic pathology. No CT evidence of pulmonary artery embolus. 2.  Emphysema (ICD10-J43.9). Electronically Signed   By: Vanetta Chou M.D.   On: 04/06/2024 19:16   DG Neck Soft Tissue Result Date: 04/06/2024 EXAM: 1 VIEW(S) XRAY OF THE SOFT TISSUE NECK 04/06/2024 05:17:00 PM COMPARISON: None available. CLINICAL HISTORY: SOB (shortness of breath). FINDINGS: SOFT TISSUES: Prevertebral soft tissue are unremarkable. EPIGLOTTIS: No epiglottic thickening. BONES: Diffuse degenerative disc disease throughout the cervical spine. IMPRESSION: 1. No acute findings. Electronically signed by: Franky Crease MD 04/06/2024 05:51 PM EST RP Workstation: [id number]   DG Chest Port 1 View Result Date: 04/06/2024 EXAM: 1 VIEW(S) XRAY OF THE CHEST 04/06/2024 05:17:00 PM COMPARISON: 11/25/2022. CLINICAL HISTORY: sob sob FINDINGS: LUNGS AND PLEURA: No focal pulmonary opacity. No pleural effusion. No pneumothorax. HEART AND MEDIASTINUM: No acute abnormality of the cardiac and mediastinal silhouettes. BONES AND SOFT TISSUES: No acute osseous abnormality. IMPRESSION: 1. No acute cardiopulmonary process. Electronically signed by: Franky Crease MD 04/06/2024 05:50 PM EST RP Workstation: [id number]    Microbiology: Results for orders placed or  performed during the hospital encounter of 04/09/24  Resp panel by RT-PCR (RSV, Flu A&B, Covid) Anterior Nasal Swab     Status: None   Collection Time: 04/09/24  7:34 AM   Specimen: Anterior Nasal Swab  Result Value Ref Range Status   SARS Coronavirus 2 by RT PCR NEGATIVE NEGATIVE Final   Influenza A by PCR NEGATIVE NEGATIVE Final   Influenza B by PCR NEGATIVE NEGATIVE Final    Comment: (NOTE) The Xpert Xpress SARS-CoV-2/FLU/RSV plus assay is intended as an aid in the diagnosis of influenza from Nasopharyngeal swab specimens and should not be used as a sole basis for treatment. Nasal washings and aspirates are unacceptable for Xpert Xpress SARS-CoV-2/FLU/RSV testing.  Fact Sheet for Patients: bloggercourse.com  Fact Sheet for  Healthcare Providers: seriousbroker.it  This test is not yet approved or cleared by the [place]  FDA and has been authorized for detection and/or diagnosis of SARS-CoV-2 by FDA under an Emergency Use Authorization (EUA). This EUA will remain in effect (meaning this test can be used) for the duration of the COVID-19 declaration under Section 564(b)(1) of the Act, 21 U.S.C. section 360bbb-3(b)(1), unless the authorization is terminated or revoked.     Resp Syncytial Virus by PCR NEGATIVE NEGATIVE Final    Comment: (NOTE) Fact Sheet for Patients: bloggercourse.com  Fact Sheet for Healthcare Providers: seriousbroker.it  This test is not yet approved or cleared by the [place]  FDA and has been authorized for detection and/or diagnosis of SARS-CoV-2 by FDA under an Emergency Use Authorization (EUA). This EUA will remain in effect (meaning this test can be used) for the duration of the COVID-19 declaration under Section 564(b)(1) of the Act, 21 U.S.C. section 360bbb-3(b)(1), unless the authorization is terminated or revoked.  Performed at [facility], [address], [place], [place] [address]     Labs: CBC: Recent Labs  Lab 04/06/24 1615 04/06/24 1706 04/07/24 0339 04/07/24 1546 04/07/24 1554 04/07/24 1555 04/09/24 0734 04/09/24 0821 04/11/24 0610  WBC 2.9*  --  3.1*  --   --   --  3.2*  --  7.4  NEUTROABS  --   --  2.2  --   --   --  2.0  --  4.9  HGB 15.5*   < > 14.4   < > 13.6 13.6 14.1 14.6 15.3*  HCT 47.4*   < > 43.4   < > 40.0 40.0 42.8 43.0 45.8  MCV 89.8  --  88.0  --   --   --  89.2  --  88.4  PLT 233  --  249  --   --   --  207  --  294   < > = values in this interval not displayed.   Basic Metabolic Panel: Recent Labs  Lab 04/06/24 1615 04/06/24 1706 04/06/24 1829 04/07/24 0339 04/07/24 1546 04/07/24 1555 04/09/24 0734 04/09/24 0821 04/09/24 1616 04/10/24 0318 04/11/24 0610  NA 139 139  --  136   < > 135 130* 130* 132* 133* 135  K 4.4 4.3  --  3.9   < > 3.7 3.7 3.6  --  4.3 4.3  CL 99 99  --  99  --   --  95*  --   --  98 96*  CO2 28  --   --  25  --   --  20*  --   --  27 28  GLUCOSE 108* 104*  --  190*  --   --  106*  --   --  169* 121*  BUN 8 8  --  16  --   --  <5*  --   --  8 5*  CREATININE 0.84 0.90  --  0.99  --   --  0.55  --   --  0.79 0.68  CALCIUM  9.7  --   --  9.1  --   --  8.3*  --   --  8.9 9.1  MG  --   --  2.1  --   --   --   --   --  1.6*  --   --   PHOS  --   --   --   --   --   --   --   --  3.5  --   --    < > = values in this interval not displayed.   Liver Function Tests: Recent Labs  Lab 04/06/24 1615 04/11/24 0610  AST 39 41  ALT 17 29  ALKPHOS 101 74  BILITOT 0.6 0.7  PROT 8.9* 8.7*  ALBUMIN 4.7 4.4   CBG: Recent Labs  Lab 04/10/24 0656 04/10/24 0727  GLUCAP 166* 168*    Discharge time spent: {LESS THAN/GREATER THAN:26388} 30 minutes.  Signed: Elgie Butter, MD [facility] 04/11/2024

## 2024-04-11 NOTE — Hospital Course (Addendum)
 Martha Schneider is a 58 year old female with extensive history of sarcoidosis, emphysema, COPD, chronic smoker, seizure, HTN, chronic vocal cord polyp, hoarseness, chronic leukopenia --presenting once again with shortness of breath, hypoxia, wheezing, dry cough. Per patient husband woke up early this morning from sleep, with shortness of breath, wheezing, EMS report she was satting 71% on room air. was recently hospitalized 12/12, was discharged 12/13..  For the same. Husband reports oxygen was never delivered.   ED Evaluation:  Per EMS report on arrival to home patient was satting 71% room air Blood pressure (!) 156/92, pulse 95, temperature (!) 96.4 F (35.8 C), temperature source Axillary, resp. rate 18, height 5' 7 (1.702 m), weight 72.6 kg, SpO2 96% on BiPAP Labs reviewed: All within normal limits exception of chloride 96, glucose 121, BUN 5, troponin 50, 93, WBC normal at 7.4, hemoglobin 15.3, LFTs within normal limits, -respiratory panel flu A, B, RSV, COVID all negative CXR- New focal consolidative opacity in the right base. Given rapid interval appearance this may be atelectatic although pneumonia is not excluded.

## 2024-04-11 NOTE — Care Management (Addendum)
 Messaged with Jermaine of [employer] to look into oxygen and nebulizer not being set up at home according to patient.  Spoke to Jermaine and he is calling her husband to set up services.

## 2024-04-11 NOTE — ED Triage Notes (Signed)
 Pt bib ems/[facility] coming from home for resp distress, O2 78 RA on arrival, tripod position, dc on Saturday for same reson, med hx. copd

## 2024-04-11 NOTE — Progress Notes (Signed)
°   04/11/24 1922  BiPAP/CPAP/SIPAP  BiPAP/CPAP/SIPAP Pt Type Adult  Reason BIPAP/CPAP not in use Non-compliant (Pt does not look like she is in any distress. Pt also refused BIPAP)  BiPAP/CPAP /SiPAP Vitals  Pulse Rate 89  Resp 13  SpO2 99 %  Bilateral Breath Sounds Inspiratory wheezes

## 2024-04-11 NOTE — Progress Notes (Signed)
°   04/11/24 0615  BiPAP/CPAP/SIPAP  BiPAP/CPAP/SIPAP Pt Type Adult  BiPAP/CPAP/SIPAP SERVO  Reason BIPAP/CPAP not in use (S)  Non-compliant (Pt did not tolerate BIPAP. Pt taken off. BIPAP on standby. Second attempt will be made after nebulized treatments are done.)  Mask Type Full face mask  Mask Size Small  IPAP 10 cmH20  EPAP 5 cmH2O  FiO2 (%) 40 %  BiPAP/CPAP /SiPAP Vitals  Bilateral Breath Sounds Expiratory wheezes;Diminished

## 2024-04-11 NOTE — Discharge Summary (Signed)
 Physician Discharge Summary   Patient: Martha Schneider MRN: [id number] DOB: [birth year]  Admit date:     04/09/2024  Discharge date: 04/10/2024  Discharge Physician: Elgie Butter   PCP: Regino Slater, MD   Recommendations at discharge:  Recommend to follow up with pulmonology and ENT as recommended.    Discharge Diagnoses: Principal Problem:   Acute hypoxic respiratory failure (HCC) Active Problems:   Sarcoidosis   SOB (shortness of breath)   Neutropenia   Hypertension   Depression   Seizure disorder (HCC)   Hypocalcemia   Elevated troponin   Tobacco abuse   Hyponatremia  Resolved Problems:   Current every day smoker  Hospital Course:  Martha Schneider is a 58 year old female with history of sarcoidosis, emphysema, smoker, seizure disorder hypertension, chronic vocal cord polyp-hoarseness, chronic leukopenia.  Presenting once again to the ED with chief complaint of shortness of breath. Per EDP EMS was called today found her satting in 70s on room air, wheezing with stridors.  Patient had a recent ED visit where she left AMA On prior visit patient had an extensive cardiac workup including echo and cardiac cath revealing normal studies with ejection fraction of 50-55%, with exception of severe aortic regurgitation Patient left AMA prior to admission, medication, inhalers, supplemental oxygen, and follow-up appointment for cardiology and ENT.   Assessment and Plan:  Acute COPD exacerbation / acute respiratory failure with hypoxia.  Requiring [place] oxygen to keep sats greater than 90%.  No wheezing heard on exam. Transitioned to steroid taper.  Continue with duonebs and Symbicort  inhaler.     Sarcoidosis: Recommend outpatient follow with pulmonology.    Elevated troponins Denies any chest pain, - status post recent evaluation by cardiology, status post cardiac cath, echocardiogram with EF 50-55% -workup within normal limits with exception of severe aortic regurgitation Per  cardiology follow-up as an outpatient-no aggressive treatment  Seizure disorder (HCC) Home medications reviewed, continue Vimpat  and Keppra  No recent history of seizures   Depression Will continue sertraline    Hypertension - Elevated BP noted, patient noncompliant with medications -Home meds will be reviewed: Currently listed Norvasc ,    Neutropenia Chronic neutropenic of unknown etiology -No signs of infection   Tobacco abuse Patient was advised on consult extensive regarding smoking cessation -Current patch has been provided   Hyponatremia Hyponatremia, serum sodium level fluctuating between 135-130, 130 -Multifactorial, poor oral intake, nutrition deficiency, sarcoidosis and seizure medication           Consultants: none.  Procedures performed: none.   Disposition: Home Diet recommendation:  Regular diet DISCHARGE MEDICATION: Allergies as of 04/10/2024   No Known Allergies      Medication List     STOP taking these medications    verapamil  120 MG 24 hr capsule Commonly known as: VERELAN        TAKE these medications    acetaminophen  500 MG tablet Commonly known as: TYLENOL  Take 1,000 mg by mouth daily as needed for mild pain (pain score 1-3) or moderate pain (pain score 4-6).   albuterol  108 (90 Base) MCG/ACT inhaler Commonly known as: VENTOLIN  HFA Inhale 2 puffs into the lungs daily as needed for wheezing or shortness of breath.   amLODipine  10 MG tablet Commonly known as: NORVASC  Take 10 mg by mouth daily.   aspirin  EC 81 MG tablet Take 81 mg by mouth daily. Swallow whole.   budesonide -formoterol  160-4.5 MCG/ACT inhaler Commonly known as: Symbicort  Inhale 2 puffs into the lungs 2 (two) times daily.   cloBAZam  10  MG tablet Commonly known as: ONFI  Take 1 tablet (10 mg total) by mouth at bedtime. What changed:  when to take this reasons to take this   guaiFENesin  600 MG 12 hr tablet Commonly known as: MUCINEX  Take 600 mg by mouth 2  (two) times daily as needed for cough or to loosen phlegm.   ipratropium-albuterol  0.5-2.5 (3) MG/3ML Soln Commonly known as: DUONEB Take 3 mLs by nebulization 2 (two) times daily.   lacosamide  200 MG Tabs tablet Commonly known as: VIMPAT  Take 1 tablet (200 mg total) by mouth 2 (two) times daily. What changed: when to take this   levETIRAcetam  500 MG tablet Commonly known as: KEPPRA  Take 2 tablets (1,000 mg total) by mouth 2 (two) times daily.   predniSONE  10 MG tablet Commonly known as: DELTASONE  Prednisone  60 mg daily for 3 days followed by  Prednisone  40 mg daily for 3 days followed by  Prednisone  20 mg daily for 3 days followed by  Prednisone  10 mg daily for 3 days.   sertraline  100 MG tablet Commonly known as: ZOLOFT  Take 1 tablet (100 mg total) by mouth daily. What changed: when to take this   SUMAtriptan  50 MG tablet Commonly known as: IMITREX  Take 1 tablet (50 mg total) by mouth daily as needed for migraine (may repeat if migraine persists in 2 hours, not to exceed 2 tablets in 1 day). May repeat in 2 hours if headache persists or recurs.   Valtoco  15 MG Dose 2 x 7.5 MG/0.1ML Lqpk Generic drug: diazePAM  (15 MG Dose) Place 15 mg into the nose as needed (for seizure). Please provide a total of 5 box/10 doses        Discharge Exam: Filed Weights   04/09/24 0731  Weight: 68 kg   General exam: Appears calm and comfortable  Respiratory system: Clear to auscultation. Respiratory effort normal. Cardiovascular system: S1 & S2 heard, RRR. No JVD,  Gastrointestinal system: Abdomen is nondistended, soft and nontender.  Central nervous system: Alert and oriented. No focal neurological deficits. Extremities: Symmetric 5 x 5 power. Skin: No rashes, lesions or ulcers Psychiatry: Mood & affect appropriate.    Condition at discharge: fair  The results of significant diagnostics from this hospitalization (including imaging, microbiology, ancillary and laboratory) are  listed below for reference.   Imaging Studies: DG Chest Portable 1 View Result Date: 04/11/2024 CLINICAL DATA:  Shortness of breath. EXAM: PORTABLE CHEST 1 VIEW COMPARISON:  04/09/2024 FINDINGS: Focal consolidative opacity in the right base is new in the interval. Given rapid interval appearance this may be atelectatic although pneumonia is not excluded. Left lung clear. No pulmonary edema or substantial pleural effusion. The cardio pericardial silhouette is enlarged. IMPRESSION: New focal consolidative opacity in the right base. Given rapid interval appearance this may be atelectatic although pneumonia is not excluded. Electronically Signed   By: Camellia Candle M.D.   On: 04/11/2024 06:34   DG Chest Port 1 View Result Date: 04/09/2024 EXAM: 1 VIEW(S) XRAY OF THE CHEST 04/09/2024 07:48:00 AM COMPARISON: Portable chest 04/06/2024. CLINICAL HISTORY: SOB. FINDINGS: LINES, TUBES AND DEVICES: Telemetry leads overlie the chest as before. LUNGS AND PLEURA: There is low inspiration on the exam with increased basilar bronchovascular crowding. No focal pneumonia is evident, with limited view of the bases. No pleural effusion. No pneumothorax. HEART AND MEDIASTINUM: Mild cardiomegaly. No vascular congestion is seen. The mediastinum is stable with aortic tortuosity and atherosclerosis. BONES AND SOFT TISSUES: No acute osseous abnormality. IMPRESSION: 1. No acute cardiopulmonary  process identified. Low inspiratory effort. 2. Mild cardiomegaly without signs of congestion. Electronically signed by: Francis Quam MD 04/09/2024 07:56 AM EST RP Workstation: [id number]   CARDIAC CATHETERIZATION Result Date: 04/07/2024 Table formatting from the original result was not included. Images from the original result were not included. Dominance: Right    Angiographically normal coronary arteries with borderline Pulmonary Arterial Pressures. (Mean PAP 23 mmHg and LVEDP/PCWP 18 to 20 mmHg) Mild to moderately reduced Cardiac  Output-Index by Fick (4.33-2.52)  RECOMMENDATIONS   Anticipated discharge date to be determined.   After discussion with Dr. Darryle Decent, plan will be to pursue cardiac MRI to evaluate for myocarditis   No indication for antiplatelet therapy at this time . Alm MICAEL Clay, MD, MS Alm Clay, M.D., M.S. Interventional Cardiologist [facility] Pager # [phone]  ECHOCARDIOGRAM COMPLETE Result Date: 04/07/2024    ECHOCARDIOGRAM REPORT   Patient Name:   Martha Schneider Date of Exam: 04/07/2024 Medical Rec #:  [id number]    Height:       63.0 in Accession #:    [id number]   Weight:       151.2 lb Date of Birth:  [birth year]     BSA:          1.717 m Patient Age:    58 years     BP:           129/85 mmHg Patient Gender: F            HR:           76 bpm. Exam Location:  Inpatient Procedure: 2D Echo, Cardiac Doppler, Color Doppler, Strain Analysis and            Intracardiac Opacification Agent (Both Spectral and Color Flow            Doppler were utilized during procedure). Indications:    R06.02 SOB. Elevated troponin  History:        Patient has no prior history of Echocardiogram examinations.                 Risk Factors:Hypertension. Sarcoidosis.  Sonographer:    Ellouise Mose RDCS Referring Phys: [id number] VISHAL R PATEL  Sonographer Comments: Technically difficult study due to poor echo windows. Patient has severe hoarseness, corrupted SSN doppler. IMPRESSIONS  1. The aortic valve is tricuspid. Aortic valve regurgitation is severe. No aortic stenosis is present.  2. Left ventricular ejection fraction, by estimation, is 50 to 55%. The left ventricle has low normal function. The left ventricle demonstrates regional wall motion abnormalities (see scoring diagram/findings for description). There is moderate left ventricular hypertrophy. Indeterminate diastolic filling due to E-A fusion. The average left ventricular global longitudinal strain is -14.9 %. The global longitudinal strain is abnormal.  3. Right  ventricular systolic function is mildly reduced. The right ventricular size is normal.  4. Moderate pleural effusion.  5. The mitral valve is normal in structure. No evidence of mitral valve regurgitation. No evidence of mitral stenosis.  6. The inferior vena cava is normal in size with greater than 50% respiratory variability, suggesting right atrial pressure of 3 mmHg. FINDINGS  Left Ventricle: Left ventricular ejection fraction, by estimation, is 50 to 55%. The left ventricle has low normal function. The left ventricle demonstrates regional wall motion abnormalities. Definity  contrast agent was given IV to delineate the left ventricular endocardial borders. The average left ventricular global longitudinal strain is -14.9 %. Strain was performed and the global longitudinal strain is abnormal. The  left ventricular internal cavity size was normal in size. There is moderate left  ventricular hypertrophy. Indeterminate diastolic filling due to E-A fusion. Right Ventricle: The right ventricular size is normal. No increase in right ventricular wall thickness. Right ventricular systolic function is mildly reduced. Left Atrium: Left atrial size was normal in size. Right Atrium: Right atrial size was normal in size. Pericardium: There is no evidence of pericardial effusion. Mitral Valve: The mitral valve is normal in structure. No evidence of mitral valve regurgitation. No evidence of mitral valve stenosis. Tricuspid Valve: The tricuspid valve is normal in structure. Tricuspid valve regurgitation is mild . No evidence of tricuspid stenosis. Aortic Valve: The aortic valve is tricuspid. Aortic valve regurgitation is severe. Aortic regurgitation PHT measures 579 msec. No aortic stenosis is present. Pulmonic Valve: The pulmonic valve was normal in structure. Pulmonic valve regurgitation is not visualized. No evidence of pulmonic stenosis. Aorta: The aortic root and ascending aorta are structurally normal, with no evidence of  dilitation. Venous: The right upper pulmonary vein is normal. The inferior vena cava is normal in size with greater than 50% respiratory variability, suggesting right atrial pressure of 3 mmHg. IAS/Shunts: There is left bowing of the interatrial septum, suggestive of elevated right atrial pressure. No atrial level shunt detected by color flow Doppler. Additional Comments: There is a moderate pleural effusion.  LEFT VENTRICLE PLAX 2D LVIDd:         3.90 cm     Diastology LVIDs:         2.70 cm     LV e' medial:    3.70 cm/s LV PW:         2.00 cm     LV E/e' medial:  11.1 LV IVS:        1.50 cm     LV e' lateral:   4.68 cm/s LVOT diam:     2.30 cm     LV E/e' lateral: 8.8 LV SV:         95 LV SV Index:   55          2D Longitudinal Strain LVOT Area:     4.15 cm    2D Strain GLS Avg:     -14.9 %  LV Volumes (MOD) LV vol d, MOD A2C: 76.3 ml LV vol d, MOD A4C: 89.3 ml LV vol s, MOD A2C: 38.7 ml LV vol s, MOD A4C: 40.8 ml LV SV MOD A2C:     37.6 ml LV SV MOD A4C:     89.3 ml LV SV MOD BP:      45.4 ml RIGHT VENTRICLE            IVC RV S prime:     8.81 cm/s  IVC diam: 1.70 cm TAPSE (M-mode): 1.3 cm                            PULMONARY VEINS                            Diastolic Velocity: 40.70 cm/s                            S/D Velocity:       1.90  Systolic Velocity:  77.60 cm/s LEFT ATRIUM             Index        RIGHT ATRIUM           Index LA diam:        2.70 cm 1.57 cm/m   RA Area:     10.90 cm LA Vol (A2C):   18.5 ml 10.77 ml/m  RA Volume:   18.40 ml  10.72 ml/m LA Vol (A4C):   23.5 ml 13.69 ml/m LA Biplane Vol: 22.1 ml 12.87 ml/m  AORTIC VALVE LVOT Vmax:   115.00 cm/s LVOT Vmean:  77.100 cm/s LVOT VTI:    0.228 m AI PHT:      579 msec  AORTA Ao Root diam: 2.90 cm Ao Asc diam:  3.70 cm MITRAL VALVE               TRICUSPID VALVE MV Area (PHT): 3.59 cm    TR Peak grad:   24.4 mmHg MV Decel Time: 212 msec    TR Vmax:        247.00 cm/s MV E velocity: 41.00 cm/s MV A velocity: 68.85  cm/s  SHUNTS MV E/A ratio:  0.60        Systemic VTI:  0.23 m                            Systemic Diam: 2.30 cm Oneil Parchment MD Electronically signed by Oneil Parchment MD Signature Date/Time: 04/07/2024/12:40:16 PM    Final    CT Soft Tissue Neck W Contrast Result Date: 04/06/2024 EXAM: CT NECK WITH CONTRAST 04/06/2024 06:59:09 PM TECHNIQUE: CT of the neck was performed with the administration of 75 mL of iohexol  (OMNIPAQUE ) 350 MG/ML injection. Multiplanar reformatted images are provided for review. Automated exposure control, iterative reconstruction, and/or weight based adjustment of the mA/kV was utilized to reduce the radiation dose to as low as reasonably achievable. COMPARISON: Same day neck soft tissue radiograph. CLINICAL HISTORY: Epiglottitis or tonsillitis suspected. FINDINGS: AERODIGESTIVE TRACT: The nasopharynx is symmetric. Retropharyngeal course of the cervical internal carotid arteries which indents the posterior wall of the oropharynx. The oropharynx is otherwise unremarkable. Symmetric appearance of the tonsils. No evidence of peritonsillar abscess. Normal appearance of the oral cavity and floor of mouth. Normal appearance of the base of tongue and epiglottis. No retropharyngeal fluid collection. The supraglottic airway is relatively symmetric. Symmetric appearance of the vocal folds. SALIVARY GLANDS: The parotid and submandibular glands are unremarkable. THYROID : Unremarkable. LYMPH NODES: No suspicious cervical lymphadenopathy. SOFT TISSUES: No mass or fluid collection. BRAIN, ORBITS, SINUSES AND MASTOIDS: Visualized portions of the paranasal sinuses and mastoid air cells are clear. No acute abnormality. LUNGS AND MEDIASTINUM: Paraseptal emphysema at the right lung apex. No acute abnormality. BONES: Degenerative changes in the visualized spine. No focal bone abnormality. IMPRESSION: 1. No CT evidence of epiglottitis or tonsillitis. 2. No cervical lymphadenopathy. Electronically signed by: Donnice Mania MD 04/06/2024 07:23 PM EST RP Workstation: [id number]   CT Angio Chest PE W/Cm &/Or Wo Cm Result Date: 04/06/2024 CLINICAL DATA:  Shortness of breath. Concern for pulmonary embolism. EXAM: CT ANGIOGRAPHY CHEST WITH CONTRAST TECHNIQUE: Multidetector CT imaging of the chest was performed using the standard protocol during bolus administration of intravenous contrast. Multiplanar CT image reconstructions and MIPs were obtained to evaluate the vascular anatomy. RADIATION DOSE REDUCTION: This exam was performed according to the departmental dose-optimization program which includes automated  exposure control, adjustment of the mA and/or kV according to patient size and/or use of iterative reconstruction technique. CONTRAST:  75mL OMNIPAQUE  IOHEXOL  350 MG/ML SOLN COMPARISON:  CT dated 11/10/2019. Chest radiograph dated 04/06/2024. FINDINGS: Cardiovascular: There is no cardiomegaly or pericardial effusion. The thoracic aorta is unremarkable. Mild dilatation of the main pulmonary trunk suggestive of pulmonary hypertension. Clinical correlation recommended. No pulmonary artery embolus identified. Mediastinum/Nodes: No hilar or mediastinal adenopathy. The esophagus is grossly unremarkable. No mediastinal fluid collection. Lungs/Pleura: Background of emphysema. Bibasilar subpleural atelectasis/scarring. There is an 8 mm left lower lobe nodule along the fissure present on the prior CT of 2021. Right upper lobe reticular scarring. No consolidative changes. There is no pleural effusion pneumothorax. The central airways are patent. Upper Abdomen: No acute abnormality. Musculoskeletal: No acute osseous pathology. Review of the MIP images confirms the above findings. IMPRESSION: 1. No acute intrathoracic pathology. No CT evidence of pulmonary artery embolus. 2.  Emphysema (ICD10-J43.9). Electronically Signed   By: Vanetta Chou M.D.   On: 04/06/2024 19:16   DG Neck Soft Tissue Result Date: 04/06/2024 EXAM: 1 VIEW(S)  XRAY OF THE SOFT TISSUE NECK 04/06/2024 05:17:00 PM COMPARISON: None available. CLINICAL HISTORY: SOB (shortness of breath). FINDINGS: SOFT TISSUES: Prevertebral soft tissue are unremarkable. EPIGLOTTIS: No epiglottic thickening. BONES: Diffuse degenerative disc disease throughout the cervical spine. IMPRESSION: 1. No acute findings. Electronically signed by: Franky Crease MD 04/06/2024 05:51 PM EST RP Workstation: [id number]   DG Chest Port 1 View Result Date: 04/06/2024 EXAM: 1 VIEW(S) XRAY OF THE CHEST 04/06/2024 05:17:00 PM COMPARISON: 11/25/2022. CLINICAL HISTORY: sob sob FINDINGS: LUNGS AND PLEURA: No focal pulmonary opacity. No pleural effusion. No pneumothorax. HEART AND MEDIASTINUM: No acute abnormality of the cardiac and mediastinal silhouettes. BONES AND SOFT TISSUES: No acute osseous abnormality. IMPRESSION: 1. No acute cardiopulmonary process. Electronically signed by: Franky Crease MD 04/06/2024 05:50 PM EST RP Workstation: [id number]    Microbiology: Results for orders placed or performed during the hospital encounter of 04/09/24  Resp panel by RT-PCR (RSV, Flu A&B, Covid) Anterior Nasal Swab     Status: None   Collection Time: 04/09/24  7:34 AM   Specimen: Anterior Nasal Swab  Result Value Ref Range Status   SARS Coronavirus 2 by RT PCR NEGATIVE NEGATIVE Final   Influenza A by PCR NEGATIVE NEGATIVE Final   Influenza B by PCR NEGATIVE NEGATIVE Final    Comment: (NOTE) The Xpert Xpress SARS-CoV-2/FLU/RSV plus assay is intended as an aid in the diagnosis of influenza from Nasopharyngeal swab specimens and should not be used as a sole basis for treatment. Nasal washings and aspirates are unacceptable for Xpert Xpress SARS-CoV-2/FLU/RSV testing.  Fact Sheet for Patients: bloggercourse.com  Fact Sheet for Healthcare Providers: seriousbroker.it  This test is not yet approved or cleared by the [place]  FDA and has been authorized  for detection and/or diagnosis of SARS-CoV-2 by FDA under an Emergency Use Authorization (EUA). This EUA will remain in effect (meaning this test can be used) for the duration of the COVID-19 declaration under Section 564(b)(1) of the Act, 21 U.S.C. section 360bbb-3(b)(1), unless the authorization is terminated or revoked.     Resp Syncytial Virus by PCR NEGATIVE NEGATIVE Final    Comment: (NOTE) Fact Sheet for Patients: bloggercourse.com  Fact Sheet for Healthcare Providers: seriousbroker.it  This test is not yet approved or cleared by the [place]  FDA and has been authorized for detection and/or diagnosis of SARS-CoV-2 by FDA under an Emergency Use Authorization (EUA). This  EUA will remain in effect (meaning this test can be used) for the duration of the COVID-19 declaration under Section 564(b)(1) of the Act, 21 U.S.C. section 360bbb-3(b)(1), unless the authorization is terminated or revoked.  Performed at [facility], [address] [place], [place] [address]     Labs: CBC: Recent Labs  Lab 04/06/24 1615 04/06/24 1706 04/07/24 0339 04/07/24 1546 04/07/24 1554 04/07/24 1555 04/09/24 0734 04/09/24 0821 04/11/24 0610  WBC 2.9*  --  3.1*  --   --   --  3.2*  --  7.4  NEUTROABS  --   --  2.2  --   --   --  2.0  --  4.9  HGB 15.5*   < > 14.4   < > 13.6 13.6 14.1 14.6 15.3*  HCT 47.4*   < > 43.4   < > 40.0 40.0 42.8 43.0 45.8  MCV 89.8  --  88.0  --   --   --  89.2  --  88.4  PLT 233  --  249  --   --   --  207  --  294   < > = values in this interval not displayed.   Basic Metabolic Panel: Recent Labs  Lab 04/06/24 1615 04/06/24 1706 04/06/24 1829 04/07/24 0339 04/07/24 1546 04/07/24 1555 04/09/24 0734 04/09/24 0821 04/09/24 1616 04/10/24 0318 04/11/24 0610  NA 139 139  --  136   < > 135 130* 130* 132* 133* 135  K 4.4 4.3  --  3.9   < > 3.7 3.7 3.6  --  4.3 4.3  CL 99 99  --  99  --   --  95*   --   --  98 96*  CO2 28  --   --  25  --   --  20*  --   --  27 28  GLUCOSE 108* 104*  --  190*  --   --  106*  --   --  169* 121*  BUN 8 8  --  16  --   --  <5*  --   --  8 5*  CREATININE 0.84 0.90  --  0.99  --   --  0.55  --   --  0.79 0.68  CALCIUM  9.7  --   --  9.1  --   --  8.3*  --   --  8.9 9.1  MG  --   --  2.1  --   --   --   --   --  1.6*  --   --   PHOS  --   --   --   --   --   --   --   --  3.5  --   --    < > = values in this interval not displayed.   Liver Function Tests: Recent Labs  Lab 04/06/24 1615 04/11/24 0610  AST 39 41  ALT 17 29  ALKPHOS 101 74  BILITOT 0.6 0.7  PROT 8.9* 8.7*  ALBUMIN 4.7 4.4   CBG: Recent Labs  Lab 04/10/24 0656 04/10/24 0727  GLUCAP 166* 168*    Discharge time spent: 36 minutes.   Signed: Elgie Butter, MD [facility] 04/11/2024

## 2024-04-11 NOTE — ED Provider Notes (Signed)
 [facility] Provider Note   CSN: [id number] Arrival date & time: 04/11/24  9391     History Chief Complaint  Patient presents with   Respiratory Distress    HPI Martha Schneider is a 58 y.o. female presenting for chief complaint of shortness of breath.  58 year old female history of COPD just discharged yesterday with new oxygen requirement.  Got home did not have access to any of her medications as the nebulizer never arrived. Endorses worsening shortness of breath throughout the night.  EMS called out and she was satting 71% on room air.  He started on 4 L nasal cannula as well as triplicate of DuoNebs with moderate improvement.  Comes in for further care and management..   Patient's recorded medical, surgical, social, medication list and allergies were reviewed in the Snapshot window as part of the initial history.   Review of Systems   Review of Systems  Constitutional:  Negative for chills and fever.  HENT:  Negative for ear pain and sore throat.   Eyes:  Negative for pain and visual disturbance.  Respiratory:  Positive for cough, shortness of breath and wheezing.   Cardiovascular:  Negative for chest pain and palpitations.  Gastrointestinal:  Negative for abdominal pain and vomiting.  Genitourinary:  Negative for dysuria and hematuria.  Musculoskeletal:  Negative for arthralgias and back pain.  Skin:  Negative for color change and rash.  Neurological:  Negative for seizures and syncope.  All other systems reviewed and are negative.   Physical Exam Updated Vital Signs BP (!) 156/92   Pulse (!) 105   Temp (!) 96.4 F (35.8 C) (Axillary)   Resp (!) 25   Ht 5' 7 (1.702 m)   Wt 72.6 kg   SpO2 100%   BMI 25.06 kg/m  Physical Exam Vitals and nursing note reviewed.  Constitutional:      General: She is not in acute distress.    Appearance: She is well-developed.  HENT:     Head: Normocephalic and atraumatic.  Eyes:      Conjunctiva/sclera: Conjunctivae normal.  Cardiovascular:     Rate and Rhythm: Normal rate and regular rhythm.     Heart sounds: No murmur heard. Pulmonary:     Effort: Pulmonary effort is normal. No respiratory distress.     Breath sounds: Wheezing present.  Chest:     Chest wall: Tenderness present.  Abdominal:     Palpations: Abdomen is soft.     Tenderness: There is no abdominal tenderness.  Musculoskeletal:        General: No swelling.     Cervical back: Neck supple.  Skin:    General: Skin is warm and dry.     Capillary Refill: Capillary refill takes less than 2 seconds.  Neurological:     Mental Status: She is alert.  Psychiatric:        Mood and Affect: Mood normal.      ED Course/ Medical Decision Making/ A&P    Procedures .Critical Care  Performed by: Jerral Meth, MD Authorized by: Jerral Meth, MD   Critical care provider statement:    Critical care time (minutes):  30   Critical care was necessary to treat or prevent imminent or life-threatening deterioration of the following conditions:  Respiratory failure   Critical care was time spent personally by me on the following activities:  Development of treatment plan with patient or surrogate, discussions with consultants, evaluation of patient's response to  treatment, examination of patient, ordering and review of laboratory studies, ordering and review of radiographic studies, ordering and performing treatments and interventions, pulse oximetry, re-evaluation of patient's condition and review of old charts    Medications Ordered in ED Medications  ipratropium-albuterol  (DUONEB) 0.5-2.5 (3) MG/3ML nebulizer solution (has no administration in time range)  ipratropium-albuterol  (DUONEB) 0.5-2.5 (3) MG/3ML nebulizer solution 3 mL (has no administration in time range)  methylPREDNISolone  sodium succinate (SOLU-MEDROL ) 125 mg/2 mL injection 125 mg (has no administration in time range)  magnesium  sulfate  IVPB 2 g 50 mL (has no administration in time range)  LORazepam  (ATIVAN ) injection 1 mg (has no administration in time range)    Medical Decision Making:  Medical Decision Making:   Martha Schneider is a 58 y.o. female with a history of COPD, who presented to the ED today with acute on chronic SOB. They are endorsing worsening of their baseline dyspnea over the past 72 hours. Their baseline is a 0L O2 requirement. At their baseline they are able to get around the house  and they are not able to at this time.   On my initial exam, the pt was SOB and tachypneic. Audible wheezing and grossly decreased breath sounds appreciated.  They are endorsing increased sputum production.    Reviewed and confirmed nursing documentation for past medical history, family history, social history.    Initial Assessment:   With the patient's presentation of SOB in the above setting, most likely diagnosis is COPD Exacerbation. Other diagnoses were considered including (but not limited to) CAP, PE, ACS, viral infection, PTX. These are considered less likely due to history of present illness and physical exam findings.   This is most consistent with an acute life/limb threatening illness complicated by underlying chronic conditions.  Initial Plan:  Empiric treatment of patient's symptoms with immediate initiation of inhaled bronchodilators and IV steroids. Given advanced nature of patient's presentation, will proceed with IV magnesium  as a rescue therapy.   Given extremis nature of the presentation, patient required non invasive ventilation initiation.  Evaluation for ACS with EKG and delta troponin  Evaluation for infectious versus intrathoracic abnormality with chest x-ray  Evaluation for volume overload with BNP  Screening labs including CBC and Metabolic panel to evaluate for infectious or metabolic etiology of disease.  Patient's Wells score is low and patient does not warrant further objective evaluation for PE based  on consistency of presentation of alternative diagnosis.   Notably, this patient arrived prior to transition of care to oncoming team. Workup was started as above and patient was stabilized with magnesium /BiPAP  Clinical Impression:  1. COPD exacerbation (HCC)      Data Unavailable   Final Clinical Impression(s) / ED Diagnoses Final diagnoses:  COPD exacerbation [facility])    Rx / DC Orders ED Discharge Orders     None         Jerral Meth, MD 04/11/24 [phone]

## 2024-04-11 NOTE — Progress Notes (Signed)
° °  Brief Progress Note   _____________________________________________________________________________________________________________  Patient Name: Martha Schneider Patient DOB: 02/17/1966 Date: @TODAY @      Data: Reviewed vital signs, labs, and clinical notes.    Action: No action required at this time.    Response: Awaiting bed assignment.  _____________________________________________________________________________________________________________  [facility] RN Expeditor Ed [address] [place] Please contact us  directly via secure chat (search for [facility]) or by calling us  at [phone] [facility]).

## 2024-04-11 NOTE — ED Provider Notes (Signed)
 8:50 AM Assumed care of patient from off-going team. For more details, please see note from same day.  In brief, this is a 58 y.o. female who presented after AMA discharge yesterday for resp distress/COPD exacerbation back with SOB that started at 0230 in the AM. Initially was satting reportedly in 70s% at home per her husband at bedside. Received steroids/duonebs/magnesium   currently on BiPAP.   Plan/Dispo at time of sign-out & ED Course since sign-out: [ ]  labs/admit  BP (!) 156/92   Pulse (!) 102   Temp (!) 96.4 F (35.8 C) (Axillary)   Resp (!) 35   Ht 5' 7 (1.702 m)   Wt 72.6 kg   SpO2 100%   BMI 25.06 kg/m    ED Course:   Patient pulling 300-500 cc on BiPAP, 5/10 on 40% FiO2. Trialed off BipAP x2 while in the ED to [facility] but unfortunately not successful d/t increased WOB and hypoxia while off of BIPAP. Will admit to medicine.     Dispo: Admit ------------------------------- Sid Boning, MD Emergency Medicine  This note was created using dictation software, which may contain spelling or grammatical errors.  CRITICAL CARE Performed by: Sid LOISE Boning Total critical care time: 30 minutes Critical care time was exclusive of separately billable procedures and treating other patients. Critical care was necessary to treat or prevent imminent or life-threatening deterioration. Critical care was time spent personally by me on the following activities: development of treatment plan with patient and/or surrogate as well as nursing, discussions with consultants, evaluation of patient's response to treatment, examination of patient, obtaining history from patient or surrogate, ordering and performing treatments and interventions, ordering and review of laboratory studies, ordering and review of radiographic studies, pulse oximetry and re-evaluation of patient's condition.     Boning Sid LOISE, MD 04/12/24 2120

## 2024-04-11 NOTE — H&P (Addendum)
 History and Physical   Patient: Martha Schneider                            PCP: Regino Slater, MD                    DOB: 1965-05-08            DOA: 04/11/2024 FMW:[id number]             DOS: 04/11/2024, 9:16 AM  Koirala, Dibas, MD  Patient coming from:   HOME  I have personally reviewed patient's medical records, in electronic medical records, including:  [facility], and care everywhere.    Chief Complaint:   Chief Complaint  Patient presents with   Respiratory Distress    History of present illness:    Martha Schneider is a 58 year old female with extensive history of sarcoidosis, emphysema, COPD, chronic smoker, seizure, HTN, chronic vocal cord polyp, hoarseness, chronic leukopenia --presenting once again with shortness of breath, hypoxia, wheezing, dry cough. Per patient husband woke up early this morning from sleep, with shortness of breath, wheezing, EMS report she was satting 71% on room air. was recently hospitalized 12/12, was discharged 12/13..  For the same. Husband reports oxygen was never delivered.  Note: S/p recent extensive cardiac evaluation including cardiac cath.   ED Evaluation:  Per EMS report on arrival to home patient was satting 71% room air Blood pressure (!) 156/92, pulse 95, temperature (!) 96.4 F (35.8 C), temperature source Axillary, resp. rate 18, height 5' 7 (1.702 m), weight 72.6 kg, SpO2 96% on BiPAP Labs reviewed: All within normal limits exception of chloride 96, glucose 121, BUN 5, troponin 50, 93, WBC normal at 7.4, hemoglobin 15.3, LFTs within normal limits, -respiratory panel flu A, B, RSV, COVID all negative CXR- New focal consolidative opacity in the right base. Given rapid interval appearance this may be atelectatic although pneumonia is not excluded.      Patient Denies having: Fever, Chills, Chest Pain, Abd pain, N/V/D, headache, dizziness, lightheadedness,  Dysuria, Joint pain, rash, open wounds  ED Course:   Blood pressure (!)  156/92, pulse 95, temperature (!) 96.4 F (35.8 C), temperature source Axillary, resp. rate 18, height 5' 7 (1.702 m), weight 72.6 kg, SpO2 96%. Abnormal labs;   Review of Systems: As per HPI, otherwise 10 point review of systems were negative.   ----------------------------------------------------------------------------------------------------------------------  Allergies[1]  Home MEDs:  Prior to Admission medications  Medication Sig Start Date End Date Taking? Authorizing Provider  acetaminophen  (TYLENOL ) 500 MG tablet Take 1,000 mg by mouth daily as needed for mild pain (pain score 1-3) or moderate pain (pain score 4-6).    [provider]  albuterol  (VENTOLIN  HFA) 108 (90 Base) MCG/ACT inhaler Inhale 2 puffs into the lungs daily as needed for wheezing or shortness of breath. 04/10/24   Akula, Vijaya, MD  amLODipine  (NORVASC ) 10 MG tablet Take 10 mg by mouth daily. 01/20/21   [provider]  aspirin  EC 81 MG tablet Take 81 mg by mouth daily. Swallow whole.    [provider]  budesonide -formoterol  (SYMBICORT ) 160-4.5 MCG/ACT inhaler Inhale 2 puffs into the lungs 2 (two) times daily. 04/10/24   Akula, Vijaya, MD  cloBAZam  (ONFI ) 10 MG tablet Take 1 tablet (10 mg total) by mouth at bedtime. Patient taking differently: Take 10 mg by mouth at bedtime as needed (sleep). 03/10/24 09/06/24  Gregg Lek, MD  diazePAM , 15  MG Dose, (VALTOCO  15 MG DOSE) 2 x 7.5 MG/0.1ML LQPK Place 15 mg into the nose as needed (for seizure). Please provide a total of 5 box/10 doses Patient not taking: Reported on 04/09/2024 03/10/24   Camara, Amadou, MD  guaiFENesin  (MUCINEX ) 600 MG 12 hr tablet Take 600 mg by mouth 2 (two) times daily as needed for cough or to loosen phlegm.    [provider]  ipratropium-albuterol  (DUONEB) 0.5-2.5 (3) MG/3ML SOLN Take 3 mLs by nebulization 2 (two) times daily. 04/10/24   Akula, Vijaya, MD  lacosamide  (VIMPAT ) 200 MG TABS tablet Take 1  tablet (200 mg total) by mouth 2 (two) times daily. Patient taking differently: Take 200 mg by mouth daily. 03/10/24   Camara, Amadou, MD  levETIRAcetam  (KEPPRA ) 500 MG tablet Take 2 tablets (1,000 mg total) by mouth 2 (two) times daily. 03/10/24   Camara, Amadou, MD  predniSONE  (DELTASONE ) 10 MG tablet Prednisone  60 mg daily for 3 days followed by  Prednisone  40 mg daily for 3 days followed by  Prednisone  20 mg daily for 3 days followed by  Prednisone  10 mg daily for 3 days. 04/10/24   Akula, Vijaya, MD  sertraline  (ZOLOFT ) 100 MG tablet Take 1 tablet (100 mg total) by mouth daily. Patient taking differently: Take 100 mg by mouth at bedtime. 06/04/22 08/18/24  Camara, Amadou, MD  SUMAtriptan  (IMITREX ) 50 MG tablet Take 1 tablet (50 mg total) by mouth daily as needed for migraine (may repeat if migraine persists in 2 hours, not to exceed 2 tablets in 1 day). May repeat in 2 hours if headache persists or recurs. 03/10/24   Camara, Amadou, MD    PRN MEDs: acetaminophen  **OR** acetaminophen , bisacodyl , hydrALAZINE , HYDROmorphone  (DILAUDID ) injection, ipratropium, ipratropium-albuterol , ondansetron  **OR** ondansetron  (ZOFRAN ) IV, oxyCODONE , senna-docusate, sodium phosphate , SUMAtriptan , traZODone   Past Medical History:  Diagnosis Date   Depression    Hypertension    Sarcoidosis    Seizures (HCC)     Past Surgical History:  Procedure Laterality Date   RIGHT/LEFT HEART CATH AND CORONARY ANGIOGRAPHY N/A 04/07/2024   Procedure: RIGHT/LEFT HEART CATH AND CORONARY ANGIOGRAPHY;  Surgeon: Anner Alm ORN, MD;  Location: [facility] INVASIVE CV LAB;  Service: Cardiovascular;  Laterality: N/A;     reports that she has been smoking cigarettes. She has a 18.5 pack-year smoking history. She has never used smokeless tobacco. She reports current alcohol use. She reports that she does not use drugs.   Family History  Problem Relation Age of Onset   Heart attack Mother    Liver cancer Father    Diabetes Mellitus  II Sister    Kidney disease Sister     Physical Exam:   Vitals:   04/11/24 9361 04/11/24 0757 04/11/24 0759 04/11/24 0857  BP:      Pulse:  (!) 103 (!) 102 95  Resp: (!) 39 (!) 25 (!) 35 18  Temp:      TempSrc:      SpO2:  96% 100% 96%  Weight:      Height:       Constitutional: NAD, calm, comfortable Eyes: PERRL, lids and conjunctivae normal ENMT: Mucous membranes are moist. Posterior pharynx clear of any exudate or lesions.Normal dentition.  Neck: normal, supple, no masses, no thyromegaly Respiratory: Labored breathing, using mildly accessory muscles - shortness of breath, with diffuse rhonchi, diffuse severe wheezing,  Diminished air sounds mid to lower lobes, negative any crackles Cardiovascular: Regular rate and rhythm, no murmurs / rubs / gallops. No extremity  edema. 2+ pedal pulses. No carotid bruits.  Abdomen: no tenderness, no masses palpated. No hepatosplenomegaly. Bowel sounds positive.  Musculoskeletal: no clubbing / cyanosis. No joint deformity upper and lower extremities. Good ROM, no contractures. Normal muscle tone.  Neurologic: CN II-XII grossly intact. Sensation intact, DTR normal. Strength 5/5 in all 4.  Psychiatric: Normal judgment and insight. Alert and oriented x 3. Normal mood.  Skin: no rashes, lesions, ulcers. No induration    Labs on admission:    I have personally reviewed following labs and imaging studies  CBC: Recent Labs  Lab 04/06/24 1615 04/06/24 1706 04/07/24 0339 04/07/24 1546 04/07/24 1554 04/07/24 1555 04/09/24 0734 04/09/24 0821 04/11/24 0610  WBC 2.9*  --  3.1*  --   --   --  3.2*  --  7.4  NEUTROABS  --   --  2.2  --   --   --  2.0  --  4.9  HGB 15.5*   < > 14.4   < > 13.6 13.6 14.1 14.6 15.3*  HCT 47.4*   < > 43.4   < > 40.0 40.0 42.8 43.0 45.8  MCV 89.8  --  88.0  --   --   --  89.2  --  88.4  PLT 233  --  249  --   --   --  207  --  294   < > = values in this interval not displayed.   Basic Metabolic Panel: Recent  Labs  Lab 04/06/24 1615 04/06/24 1706 04/06/24 1829 04/07/24 0339 04/07/24 1546 04/07/24 1555 04/09/24 0734 04/09/24 0821 04/09/24 1616 04/10/24 0318 04/11/24 0610  NA 139 139  --  136   < > 135 130* 130* 132* 133* 135  K 4.4 4.3  --  3.9   < > 3.7 3.7 3.6  --  4.3 4.3  CL 99 99  --  99  --   --  95*  --   --  98 96*  CO2 28  --   --  25  --   --  20*  --   --  27 28  GLUCOSE 108* 104*  --  190*  --   --  106*  --   --  169* 121*  BUN 8 8  --  16  --   --  <5*  --   --  8 5*  CREATININE 0.84 0.90  --  0.99  --   --  0.55  --   --  0.79 0.68  CALCIUM  9.7  --   --  9.1  --   --  8.3*  --   --  8.9 9.1  MG  --   --  2.1  --   --   --   --   --  1.6*  --   --   PHOS  --   --   --   --   --   --   --   --  3.5  --   --    < > = values in this interval not displayed.   GFR: Estimated Creatinine Clearance: 73.6 mL/min (by C-G formula based on SCr of 0.68 mg/dL). Liver Function Tests: Recent Labs  Lab 04/06/24 1615 04/11/24 0610  AST 39 41  ALT 17 29  ALKPHOS 101 74  BILITOT 0.6 0.7  PROT 8.9* 8.7*  ALBUMIN 4.7 4.4   No results for input(s): LIPASE, AMYLASE in the last 168 hours.  No results for input(s): AMMONIA in the last 168 hours. Coagulation Profile: No results for input(s): INR, PROTIME in the last 168 hours. Cardiac Enzymes: No results for input(s): CKTOTAL, CKMB, CKMBINDEX, TROPONINI in the last 168 hours. BNP (last 3 results) No results for input(s): PROBNP in the last 8760 hours. HbA1C: No results for input(s): HGBA1C in the last 72 hours. CBG: Recent Labs  Lab 04/10/24 0656 04/10/24 0727  GLUCAP 166* 168*   Lipid Profile: No results for input(s): CHOL, HDL, LDLCALC, TRIG, CHOLHDL, LDLDIRECT in the last 72 hours. Thyroid  Function Tests: No results for input(s): TSH, T4TOTAL, FREET4, T3FREE, THYROIDAB in the last 72 hours. Anemia Panel: No results for input(s): VITAMINB12, FOLATE, FERRITIN, TIBC, IRON,  RETICCTPCT in the last 72 hours. Urine analysis:    Component Value Date/Time   COLORURINE YELLOW 11/25/2022 2132   APPEARANCEUR CLEAR 11/25/2022 2132   LABSPEC 1.013 11/25/2022 2132   PHURINE 6.0 11/25/2022 2132   GLUCOSEU NEGATIVE 11/25/2022 2132   HGBUR NEGATIVE 11/25/2022 2132   BILIRUBINUR NEGATIVE 11/25/2022 2132   BILIRUBINUR Negative 12/17/2013 1608   KETONESUR 5 (A) 11/25/2022 2132   PROTEINUR 100 (A) 11/25/2022 2132   UROBILINOGEN 0.2 12/17/2013 1608   NITRITE NEGATIVE 11/25/2022 2132   LEUKOCYTESUR TRACE (A) 11/25/2022 2132    Last A1C:  Lab Results  Component Value Date   HGBA1C 5.7 (H) 12/17/2013     Radiologic Exams on Admission:   DG Chest Portable 1 View Result Date: 04/11/2024 CLINICAL DATA:  Shortness of breath. EXAM: PORTABLE CHEST 1 VIEW COMPARISON:  04/09/2024 FINDINGS: Focal consolidative opacity in the right base is new in the interval. Given rapid interval appearance this may be atelectatic although pneumonia is not excluded. Left lung clear. No pulmonary edema or substantial pleural effusion. The cardio pericardial silhouette is enlarged. IMPRESSION: New focal consolidative opacity in the right base. Given rapid interval appearance this may be atelectatic although pneumonia is not excluded. Electronically Signed   By: Camellia Candle M.D.   On: 04/11/2024 06:34    EKG:   Independently reviewed.  Orders placed or performed during the hospital encounter of 04/11/24   ED EKG   ED EKG   EKG 12-Lead   ---------------------------------------------------------------------------------------------------------------------------------------    Assessment / Plan:   Principal Problem:   Acute hypoxic respiratory failure (HCC) Active Problems:   Sarcoidosis   COPD with acute exacerbation (HCC)   Hypertension   Depression   Elevated troponin   Hypomagnesemia   Dyspnea on exertion   Chronic leukopenia   Recurrent seizures (HCC)     Assessment  and Plan: * Acute hypoxic respiratory failure (HCC)  -With underlying diagnosis of COPD with exacerbation, chronic tobacco abuse, sarcoidosis, local cold polyps  -S/p recent hospitalization and discharge yesterday 12/12 -- 12/13  - Apparently home O2 was never delivered -patient was found earlier this morning with acute hypoxic respiratory failure, satting 71% with extensive wheezing and shortness of breath - Currently on BiPAP -O2 flow 10 L, FiO2 40%, satting 96%  - Reexacerbation of COPD, with possible pneumonia versus atelectasis on chest x-ray - Continue with DuoNeb bronchodilators, scheduled and as needed - Continue IV steroids, p.o. antibiotics-empiric - Received Solu-Medrol  125 mg will continue with 80 mg IV twice daily    -Multifactorial including emphysema, undiagnosed COPD, chronic tobacco use, sarcoidosis -vocal cord polyps   -Will encourage use of incentive spirometer, flutter valve     -Consult TOC/social worker for rearrangement of home oxygen -Nebulizer machine for home use  Sarcoidosis Will need outpatient follow-up with PCP, likely pulmonologist - Restarting IV steroids  Seizure disorder (HCC) Home medications reviewed, continue Vimpat  and Keppra  No recent history of seizures   Depression Will continue sertraline    Hypertension - Elevated BP noted, patient noncompliant with medications -Home meds will be reviewed: Currently listed Norvasc ,      Tobacco abuse Patient was advised on consult extensive regarding smoking cessation -Current patch has been provided   Consults called:  None -------------------------------------------------------------------------------------------------------------------------------------------- DVT prophylaxis:  heparin  injection 5,000 Units Start: 04/11/24 0900 SCDs Start: 04/11/24 0856   Code Status:   Code Status: Full Code   Admission status: Patient will be admitted as Observation, with a greater than 2 midnight  length of stay. Level of care: Telemetry   Family Communication: Husband present at bedside-updated (The above findings and plan of care has been discussed with patient in detail, the patient expressed understanding and agreement of above plan)  --------------------------------------------------------------------------------------------------------------------------------------------------  Disposition Plan:  Anticipated 1-2 days Status is: Observation The patient remains OBS appropriate and will d/c before 2 midnights.  ----------------------------------------------------------------------------------------------------------------------------------  Time spent:  11  Min.  Was spent seeing and evaluating the patient, reviewing all medical records, drawn plan of care.  SIGNED: Adriana DELENA Grams, MD, FHM. FAAFP. [place] [facility], Pager  (Please use [email or web address] to page/ or secure chat through epic) If 7PM-7AM, please contact night-coverage [email or web address],  04/11/2024, 9:16 AM     [1] No Known Allergies

## 2024-04-12 LAB — CBC WITH DIFFERENTIAL/PLATELET
Abs Immature Granulocytes: 0.04 K/uL (ref 0.00–0.07)
Basophils Absolute: 0 K/uL (ref 0.0–0.1)
Basophils Relative: 0 %
Eosinophils Absolute: 0 K/uL (ref 0.0–0.5)
Eosinophils Relative: 0 %
HCT: 36.5 % (ref 36.0–46.0)
Hemoglobin: 12 g/dL (ref 12.0–15.0)
Immature Granulocytes: 1 %
Lymphocytes Relative: 8 %
Lymphs Abs: 0.4 K/uL — ABNORMAL LOW (ref 0.7–4.0)
MCH: 29.5 pg (ref 26.0–34.0)
MCHC: 32.9 g/dL (ref 30.0–36.0)
MCV: 89.7 fL (ref 80.0–100.0)
Monocytes Absolute: 0.3 K/uL (ref 0.1–1.0)
Monocytes Relative: 6 %
Neutro Abs: 4.5 K/uL (ref 1.7–7.7)
Neutrophils Relative %: 85 %
Platelets: 203 K/uL (ref 150–400)
RBC: 4.07 MIL/uL (ref 3.87–5.11)
RDW: 14.1 % (ref 11.5–15.5)
WBC: 5.3 K/uL (ref 4.0–10.5)
nRBC: 0 % (ref 0.0–0.2)

## 2024-04-12 LAB — CBG MONITORING, ED: Glucose-Capillary: 136 mg/dL — ABNORMAL HIGH (ref 70–99)

## 2024-04-12 NOTE — Progress Notes (Signed)
 SATURATION QUALIFICATIONS: (This note is used to comply with regulatory documentation for home oxygen)  Patient Saturations on Room Air at Rest = 84%  Patient Saturations on Room Air while Ambulating = N/A%  Patient Saturations on 3 Liters of oxygen while Ambulating = 95%  Please briefly explain why patient needs home oxygen: Unable to maintain adequate level oxygenation at rest and with activity without supplemental O2.  [facility] PT Acute Rehabilitation Services Office [phone]

## 2024-04-12 NOTE — Progress Notes (Signed)
 [employer] has completed set-up of home oxygen and delivered a portable tank and rollator to the pt's bedside. Instructed pt and her husband on the importance of understanding the use of oxygen and how to safely store it. They are to go home and review the equipment that has been delivered and call [employer] if they have any questions. 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

## 2024-04-12 NOTE — Evaluation (Signed)
 Occupational Therapy Evaluation and Discharge Patient Details Name: Martha Schneider MRN: [id number] DOB: 1965/07/16 Today's Date: 04/12/2024   History of Present Illness   Pt is a 58 year old woman who presented on 04/11/24 with acute hypoxic respiratory failure in the setting of sarcoidosis and history of emphysema. Pt discharged the day before, O2 and nebulizer were not delivered. PMH: tobacco use, seizures disorder, HTN, chronic vocal cord polyp hoarseness, chronic leukopenia, depression.     Clinical Impressions Pt is known by this therapist from previous admission. She typically walks without AD, endorses R knee buckling at times. She is assisted for LB dressing by her husband and prefers to get down in tub for bathing. Pt typically stays on second floor of house to avoid stairs. Her husband assists with all IADLs. Pt presents with decreased activity tolerance. SpO2 on 3L 92% for transfers to and from [facility]. Provided CGA for transfer. Rollator was recommended last visit, had not been delivered when pt was discharged. Recommend HHRN to assist in medication and disease management. Pt is aware she should not discharge until equipment is delivered.      If plan is discharge home, recommend the following:   A little help with walking and/or transfers;A little help with bathing/dressing/bathroom;Assistance with cooking/housework;Assist for transportation;Help with stairs or ramp for entrance     Functional Status Assessment   Patient has had a recent decline in their functional status and demonstrates the ability to make significant improvements in function in a reasonable and predictable amount of time.     Equipment Recommendations   Other (comment) (rollator)     Recommendations for Other Services         Precautions/Restrictions   Precautions Precautions: Fall Recall of Precautions/Restrictions: Intact Restrictions Weight Bearing Restrictions Per Provider Order: No      Mobility Bed Mobility Overal bed mobility: Modified Independent             General bed mobility comments: to edge of stretcher    Transfers Overall transfer level: Needs assistance Equipment used: None Transfers: Sit to/from Stand, Bed to chair/wheelchair/BSC Sit to Stand: Contact guard assist     Step pivot transfers: Contact guard assist     General transfer comment: CGA for safety and due to hx of R knee buckling per pt report      Balance                                           ADL either performed or assessed with clinical judgement   ADL Overall ADL's : Needs assistance/impaired Eating/Feeding: Independent   Grooming: Wash/dry hands;Standing;Contact guard assist   Upper Body Bathing: Set up;Sitting   Lower Body Bathing: Minimal assistance;Sit to/from stand   Upper Body Dressing : Set up;Sitting   Lower Body Dressing: Set up;Sitting/lateral leans Lower Body Dressing Details (indicate cue type and reason): shoes Toilet Transfer: Contact guard assist;Stand-pivot;BSC/3in1   Toileting- Clothing Manipulation and Hygiene: Set up;Sitting/lateral lean         General ADL Comments: Reinforced pursed lip breathing and energy conservation.     Vision Baseline Vision/History: 1 Wears glasses Ability to See in Adequate Light: 0 Adequate Patient Visual Report: No change from baseline       Perception         Praxis         Pertinent Vitals/Pain Pain Assessment Pain Assessment: No/denies  pain     Extremity/Trunk Assessment Upper Extremity Assessment Upper Extremity Assessment: Overall WFL for tasks assessed   Lower Extremity Assessment Lower Extremity Assessment: Defer to PT evaluation       Communication Communication Communication: No apparent difficulties Factors Affecting Communication: Other (comment) (baseline hoarse voice)   Cognition Arousal: Alert Behavior During Therapy: WFL for tasks  assessed/performed Cognition: No apparent impairments             OT - Cognition Comments: decreased medical literacy                 Following commands: Intact       Cueing  General Comments   Cueing Techniques: Verbal cues      Exercises     Shoulder Instructions      Home Living Family/patient expects to be discharged to:: Private residence Living Arrangements: Spouse/significant other;Children Available Help at Discharge: Family;Available PRN/intermittently Type of Home: House Home Access: Stairs to enter [employer] of Steps: 2 Entrance Stairs-Rails: None Home Layout: Two level Alternate Level Stairs-Number of Steps: 12-13 Alternate Level Stairs-Rails: Left Bathroom Shower/Tub: Chief Strategy Officer: Standard     Home Equipment: None   Additional Comments: stays on second floor of house primarily with microwave and fridge upstairs      Prior Functioning/Environment Prior Level of Function : Needs assist             Mobility Comments: ambulating without an AD. ADLs Comments: Spouse helps with pants, shoes and PRN with cleaning.    OT Problem List:     OT Treatment/Interventions:        OT Goals(Current goals can be found in the care plan section)       OT Frequency:       Co-evaluation              AM-PAC OT 6 Clicks Daily Activity     Outcome Measure Help from another person eating meals?: None Help from another person taking care of personal grooming?: A Little Help from another person toileting, which includes using toliet, bedpan, or urinal?: A Little Help from another person bathing (including washing, rinsing, drying)?: A Little Help from another person to put on and taking off regular upper body clothing?: A Little Help from another person to put on and taking off regular lower body clothing?: A Little 6 Click Score: 19   End of Session Equipment Utilized During Treatment: Gait  belt;Oxygen (3L) Nurse Communication: Other (comment) (left on 3L with SpO2 of 92-96%)  Activity Tolerance: Patient tolerated treatment well Patient left: in bed;with call bell/phone within reach;Other (comment) (MD in room)  OT Visit Diagnosis: Unsteadiness on feet (R26.81);Other (comment) (decreased activity tolerance)                Time: 9152-9082 OT Time Calculation (min): 30 min Charges:  OT General Charges $OT Visit: 1 Visit OT Evaluation $OT Eval Low Complexity: 1 Low OT Treatments $Self Care/Home Management : 8-22 mins  Mliss HERO, OTR/L Acute Rehabilitation Services Office: [phone]   Kennth Mliss Helling 04/12/2024, 9:30 AM

## 2024-04-12 NOTE — Progress Notes (Signed)
 Rolling walker added to DME order. Jermaine at [facility] accepted referral. [employer] to deliver portable oxygen tank and rollator to pt's room. Home oxygen set-up and nebulizer to be delivered to the home.

## 2024-04-12 NOTE — Evaluation (Signed)
 Physical Therapy Evaluation Patient Details Name: Martha Schneider MRN: [id number] DOB: 1965/09/15 Today's Date: 04/12/2024  History of Present Illness  Pt is a 58 year old woman who presented on 04/11/24 with acute hypoxic respiratory failure in the setting of sarcoidosis and history of emphysema. Pt discharged the day before, O2 and nebulizer were not delivered. PMH: tobacco use, seizures disorder, HTN, chronic vocal cord polyp hoarseness, chronic leukopenia, depression.  Clinical Impression  Pt doing well with mobility and no further PT needed.  Ready for dc from PT standpoint. Needs rollator for home and home O2 prior to dc.          If plan is discharge home, recommend the following: Help with stairs or ramp for entrance   Can travel by private vehicle        Equipment Recommendations Rollator (4 wheels)  Recommendations for Other Services       Functional Status Assessment Patient has not had a recent decline in their functional status     Precautions / Restrictions Precautions Precautions: Fall Recall of Precautions/Restrictions: Intact Restrictions Weight Bearing Restrictions Per Provider Order: No      Mobility  Bed Mobility Overal bed mobility: Modified Independent                  Transfers Overall transfer level: Modified independent Equipment used: Rollator (4 wheels) Transfers: Sit to/from Stand Sit to Stand: Modified independent (Device/Increase time)                Ambulation/Gait Ambulation/Gait assistance: Supervision Gait Distance (Feet): 125 Feet Assistive device: Rollator (4 wheels) Gait Pattern/deviations: Step-through pattern, Decreased stride length Gait velocity: decr Gait velocity interpretation: 1.31 - 2.62 ft/sec, indicative of limited community ambulator   General Gait Details: Supervision for safety. Good use of rollator. No knee buckling  Stairs            Wheelchair Mobility     Tilt Bed    Modified Rankin  (Stroke Patients Only)       Balance Overall balance assessment: Mild deficits observed, not formally tested                                           Pertinent Vitals/Pain Pain Assessment Pain Assessment: No/denies pain    Home Living Family/patient expects to be discharged to:: Private residence Living Arrangements: Spouse/significant other;Children Available Help at Discharge: Family;Available PRN/intermittently Type of Home: House Home Access: Stairs to enter Entrance Stairs-Rails: None Entrance Stairs-Number of Steps: 2 Alternate Level Stairs-Number of Steps: 12-13 Home Layout: Two level Home Equipment: None Additional Comments: stays on second floor of house primarily with microwave and fridge upstairs    Prior Function Prior Level of Function : Needs assist             Mobility Comments: ambulating without an AD. Reports  buckling of rt knee at times ADLs Comments: Spouse helps with pants, shoes and PRN with cleaning.     Extremity/Trunk Assessment   Upper Extremity Assessment Upper Extremity Assessment: Defer to OT evaluation    Lower Extremity Assessment Lower Extremity Assessment: Generalized weakness    Cervical / Trunk Assessment Cervical / Trunk Assessment: Normal  Communication   Communication Communication: No apparent difficulties Factors Affecting Communication: Other (comment) (baseline hoarse voice)    Cognition Arousal: Alert Behavior During Therapy: WFL for tasks assessed/performed   PT - Cognitive impairments:  No apparent impairments                         Following commands: Intact       Cueing Cueing Techniques: Verbal cues     General Comments      Exercises     Assessment/Plan    PT Assessment Patient does not need any further PT services  PT Problem List         PT Treatment Interventions      PT Goals (Current goals can be found in the Care Plan section)  Acute Rehab PT  Goals PT Goal Formulation: All assessment and education complete, DC therapy    Frequency       Co-evaluation               AM-PAC PT 6 Clicks Mobility  Outcome Measure Help needed turning from your back to your side while in a flat bed without using bedrails?: None Help needed moving from lying on your back to sitting on the side of a flat bed without using bedrails?: None Help needed moving to and from a bed to a chair (including a wheelchair)?: None Help needed standing up from a chair using your arms (e.g., wheelchair or bedside chair)?: None Help needed to walk in hospital room?: A Little Help needed climbing 3-5 steps with a railing? : A Little 6 Click Score: 22    End of Session Equipment Utilized During Treatment: Oxygen Activity Tolerance: Patient tolerated treatment well Patient left: in bed;with call bell/phone within reach (on stretcher) Nurse Communication: Mobility status PT Visit Diagnosis: Other abnormalities of gait and mobility (R26.89)    Time: 8980-8962 PT Time Calculation (min) (ACUTE ONLY): 18 min   Charges:   PT Evaluation $PT Eval Low Complexity: 1 Low   PT General Charges $$ ACUTE PT VISIT: 1 Visit         [facility] PT Acute Rehabilitation Services Office [phone]   Rodgers ORN [facility] 04/12/2024, 11:02 AM

## 2024-04-16 LAB — CULTURE, BLOOD (ROUTINE X 2)
Culture: NO GROWTH
Culture: NO GROWTH

## 2024-10-20 ENCOUNTER — Ambulatory Visit: Admitting: Neurology

## 2024-10-27 ENCOUNTER — Ambulatory Visit: Admitting: Neurology
